# Patient Record
Sex: Female | Born: 1955
Health system: Southern US, Community
[De-identification: ages and names within clinical notes are randomized; demographics above are authoritative.]

## PROBLEM LIST (undated history)

## (undated) DIAGNOSIS — K219 Gastro-esophageal reflux disease without esophagitis: Secondary | ICD-10-CM

## (undated) DIAGNOSIS — E559 Vitamin D deficiency, unspecified: Secondary | ICD-10-CM

## (undated) DIAGNOSIS — I10 Essential (primary) hypertension: Secondary | ICD-10-CM

## (undated) DIAGNOSIS — I639 Cerebral infarction, unspecified: Secondary | ICD-10-CM

## (undated) DIAGNOSIS — E785 Hyperlipidemia, unspecified: Secondary | ICD-10-CM

## (undated) HISTORY — DX: Essential (primary) hypertension: I10

## (undated) HISTORY — DX: Gastro-esophageal reflux disease without esophagitis: K21.9

## (undated) HISTORY — DX: Vitamin D deficiency, unspecified: E55.9

## (undated) HISTORY — DX: Hyperlipidemia, unspecified: E78.5

---

## 1992-11-12 HISTORY — PX: CHOLECYSTECTOMY: SHX55

## 1993-11-12 HISTORY — PX: TUBAL LIGATION: SHX77

## 2000-11-12 HISTORY — PX: OTHER SURGICAL HISTORY: SHX169

## 2005-02-08 ENCOUNTER — Ambulatory Visit: Payer: Self-pay | Admitting: Internal Medicine

## 2005-02-13 ENCOUNTER — Ambulatory Visit: Payer: Self-pay | Admitting: Internal Medicine

## 2005-08-17 ENCOUNTER — Ambulatory Visit: Payer: Self-pay | Admitting: Internal Medicine

## 2006-02-01 ENCOUNTER — Ambulatory Visit: Payer: Self-pay | Admitting: Internal Medicine

## 2006-04-05 ENCOUNTER — Ambulatory Visit: Payer: Self-pay | Admitting: Internal Medicine

## 2007-07-03 ENCOUNTER — Ambulatory Visit: Payer: Self-pay | Admitting: Internal Medicine

## 2008-07-13 ENCOUNTER — Ambulatory Visit: Payer: Self-pay | Admitting: Internal Medicine

## 2009-08-01 ENCOUNTER — Ambulatory Visit: Payer: Self-pay | Admitting: Internal Medicine

## 2010-04-05 ENCOUNTER — Ambulatory Visit: Payer: Self-pay | Admitting: Unknown Physician Specialty

## 2010-09-09 ENCOUNTER — Ambulatory Visit: Payer: Self-pay | Admitting: Internal Medicine

## 2010-12-06 ENCOUNTER — Ambulatory Visit: Payer: Self-pay | Admitting: Internal Medicine

## 2010-12-06 LAB — HM DEXA SCAN

## 2010-12-19 ENCOUNTER — Ambulatory Visit: Payer: Self-pay | Admitting: Internal Medicine

## 2011-11-26 LAB — HEPATIC FUNCTION PANEL
ALT: 13 U/L (ref 7–35)
AST: 13 U/L (ref 13–35)

## 2011-11-26 LAB — HM PAP SMEAR: HM Pap smear: NEGATIVE

## 2012-01-02 ENCOUNTER — Ambulatory Visit: Payer: Self-pay | Admitting: Unknown Physician Specialty

## 2012-01-05 LAB — HM MAMMOGRAPHY

## 2012-06-02 LAB — BASIC METABOLIC PANEL
BUN: 8 mg/dL (ref 4–21)
Creatinine: 1 mg/dL (ref <=1.1)
Potassium: 4.3 mmol/L (ref 3.4–5.3)
Sodium: 140 mmol/L (ref 137–147)

## 2012-08-14 ENCOUNTER — Ambulatory Visit: Payer: Self-pay | Admitting: Family Medicine

## 2012-09-01 ENCOUNTER — Ambulatory Visit: Payer: Self-pay

## 2012-09-17 ENCOUNTER — Ambulatory Visit: Payer: Self-pay | Admitting: Family Medicine

## 2012-12-05 ENCOUNTER — Encounter: Payer: Self-pay | Admitting: *Deleted

## 2012-12-08 ENCOUNTER — Other Ambulatory Visit: Payer: Self-pay | Admitting: *Deleted

## 2012-12-08 ENCOUNTER — Encounter: Payer: Self-pay | Admitting: Internal Medicine

## 2012-12-08 ENCOUNTER — Ambulatory Visit (INDEPENDENT_AMBULATORY_CARE_PROVIDER_SITE_OTHER): Payer: 59 | Admitting: Internal Medicine

## 2012-12-08 ENCOUNTER — Other Ambulatory Visit (HOSPITAL_COMMUNITY)
Admission: RE | Admit: 2012-12-08 | Discharge: 2012-12-08 | Disposition: A | Discharge status: Home / Self Care | Payer: 59 | Source: Ambulatory Visit | Attending: Internal Medicine | Admitting: Internal Medicine

## 2012-12-08 VITALS — BP 118/78 | HR 94 | Temp 97.5°F | Ht 67.5 in | Wt 185.0 lb

## 2012-12-08 DIAGNOSIS — K219 Gastro-esophageal reflux disease without esophagitis: Secondary | ICD-10-CM

## 2012-12-08 DIAGNOSIS — Z01419 Encounter for gynecological examination (general) (routine) without abnormal findings: Secondary | ICD-10-CM | POA: Insufficient documentation

## 2012-12-08 DIAGNOSIS — E559 Vitamin D deficiency, unspecified: Secondary | ICD-10-CM

## 2012-12-08 DIAGNOSIS — E7800 Pure hypercholesterolemia, unspecified: Secondary | ICD-10-CM

## 2012-12-08 DIAGNOSIS — R5381 Other malaise: Secondary | ICD-10-CM

## 2012-12-08 DIAGNOSIS — R5383 Other fatigue: Secondary | ICD-10-CM

## 2012-12-08 DIAGNOSIS — Z139 Encounter for screening, unspecified: Secondary | ICD-10-CM

## 2012-12-08 DIAGNOSIS — Z1151 Encounter for screening for human papillomavirus (HPV): Secondary | ICD-10-CM | POA: Insufficient documentation

## 2012-12-08 DIAGNOSIS — I10 Essential (primary) hypertension: Secondary | ICD-10-CM

## 2012-12-08 DIAGNOSIS — E1169 Type 2 diabetes mellitus with other specified complication: Secondary | ICD-10-CM | POA: Insufficient documentation

## 2012-12-08 LAB — CBC WITH DIFFERENTIAL/PLATELET
Eosinophils Absolute: 0.1 10*3/uL (ref 0.0–0.7)
HCT: 44.2 % (ref 36.0–46.0)
Lymphs Abs: 1.9 10*3/uL (ref 0.7–4.0)
MCHC: 33.5 g/dL (ref 30.0–36.0)
MCV: 92 fl (ref 78.0–100.0)
Monocytes Absolute: 0.5 10*3/uL (ref 0.1–1.0)
Neutrophils Relative %: 68.5 % (ref 43.0–77.0)
Platelets: 212 10*3/uL (ref 150.0–400.0)

## 2012-12-08 LAB — TSH: TSH: 1.81 u[IU]/mL (ref 0.35–5.50)

## 2012-12-08 LAB — COMPREHENSIVE METABOLIC PANEL
AST: 16 U/L (ref 0–37)
Albumin: 4.1 g/dL (ref 3.5–5.2)
Alkaline Phosphatase: 78 U/L (ref 39–117)
BUN: 9 mg/dL (ref 6–23)
Potassium: 4.5 mEq/L (ref 3.5–5.1)
Sodium: 141 mEq/L (ref 135–145)
Total Bilirubin: 0.5 mg/dL (ref 0.3–1.2)

## 2012-12-10 ENCOUNTER — Encounter: Payer: Self-pay | Admitting: Internal Medicine

## 2012-12-10 DIAGNOSIS — I10 Essential (primary) hypertension: Secondary | ICD-10-CM | POA: Insufficient documentation

## 2012-12-10 DIAGNOSIS — E559 Vitamin D deficiency, unspecified: Secondary | ICD-10-CM | POA: Insufficient documentation

## 2012-12-10 DIAGNOSIS — K219 Gastro-esophageal reflux disease without esophagitis: Secondary | ICD-10-CM | POA: Insufficient documentation

## 2012-12-10 NOTE — Assessment & Plan Note (Signed)
Blood pressure doing well on current medication regimen.  Check metabolic panel.  Follow.

## 2012-12-10 NOTE — Assessment & Plan Note (Signed)
Continue on vitamin d supplements.  Follow.    

## 2012-12-10 NOTE — Progress Notes (Signed)
Subjective:    Patient ID: Carrie Wilson, female    DOB: [birth year], 57 y.o.   MRN: [id number]  HPI 57 year old female with past history of hypertension and hypercholesterolemia who comes in today to follow up on these issues as well as for a complete physical exam.  She states she is doing well.  Previously had increased congestion.  Has resolved now - after abx.  Still smoking.  Discussed with her regarding stopping smoking.  Not ready to quit.  Down to 1/2 ppd.  Feels her breathing is stable.  No chest pain or tightness.  No acid reflux.  Has not needed nexium.  No nausea or vomiting.  Bowels stable.   Past Medical History  Diagnosis Date  . Hyperlipidemia   . Hypertension   . GERD (gastroesophageal reflux disease)     neg H. pylori  . Vitamin D deficiency     Current Outpatient Prescriptions on File Prior to Visit  Medication Sig Dispense Refill  . Cholecalciferol (VITAMIN D-3) 1000 UNITS CAPS Take by mouth daily. 1 tab daily      . lisinopril (PRINIVIL,ZESTRIL) 10 MG tablet Take 10 mg by mouth daily.        Review of Systems Patient denies any headache, lightheadedness or dizziness. No sinus or allergy symptoms currently.   No chest pain, tightness or palpitations.  No increased shortness of breath, cough or congestion.  No nausea or vomiting.  No acid reflux.  No abdominal pain or cramping.  No bowel change, such as diarrhea, constipation, BRBPR or melana.  No urine change.   Some fatigue, but overall she feels she is doing well.  Increased stress at work, but she feels she is handling well.      Objective:   Physical Exam Filed Vitals:   12/08/12 0811  BP: 118/78  Pulse: 94  Temp: 97.5 F (32.80 C)   57 year old female in no acute distress.   HEENT:  Nares- clear.  Oropharynx - without lesions. NECK:  Supple.  Nontender.  No audible bruit.  HEART:  Appears to be regular. LUNGS:  No crackles or wheezing audible.  Respirations even and unlabored.  RADIAL PULSE:  Equal  bilaterally.    BREASTS:  No nipple discharge or nipple retraction present.  Could not appreciate any distinct nodules or axillary adenopathy.  ABDOMEN:  Soft, nontender.  Bowel sounds present and normal.  No audible abdominal bruit.  GU:  Normal external genitalia.  Vaginal vault without lesions.  Cervix identified.  Pap performed. Could not appreciate any adnexal masses or tenderness.   RECTAL:  Heme negative.   EXTREMITIES:  No increased edema present.  DP pulses palpable and equal bilaterally.           Assessment & Plan:  SMOKING CESSATION.  Discussed with her today regarding the need to quit.  See above.    PULMONARY.  Breathing stable.  CXR 11/26/11 - no acute cardiopulmonary disease.    HISTORY OF VERTIGO.  Saw ENT.  Had epley maneuvers.  Doing well now.  Follow.   HEALTH MAINTENANCE.  Physical today.  Last mammogram 01/02/12 - BiRads ii.  Schedule follow up mammogram.  colonoscopoy 04/05/10 with one 3mm polyp removed and internal hemorrhoids.  Per record due follow up 03/2015.

## 2012-12-10 NOTE — Assessment & Plan Note (Signed)
Currently asymptomatic on no medications.  Follow.   

## 2012-12-10 NOTE — Assessment & Plan Note (Signed)
Low cholesterol diet and exercise.  Check lipid panel.   

## 2012-12-11 ENCOUNTER — Encounter: Payer: Self-pay | Admitting: *Deleted

## 2012-12-15 ENCOUNTER — Encounter: Payer: Self-pay | Admitting: *Deleted

## 2012-12-16 ENCOUNTER — Encounter: Payer: Self-pay | Admitting: *Deleted

## 2013-01-20 ENCOUNTER — Ambulatory Visit: Payer: Self-pay | Admitting: Internal Medicine

## 2013-02-19 ENCOUNTER — Encounter: Payer: Self-pay | Admitting: Internal Medicine

## 2013-06-09 ENCOUNTER — Encounter: Payer: Self-pay | Admitting: Internal Medicine

## 2013-06-09 ENCOUNTER — Ambulatory Visit (INDEPENDENT_AMBULATORY_CARE_PROVIDER_SITE_OTHER): Payer: 59 | Admitting: Internal Medicine

## 2013-06-09 VITALS — BP 110/70 | HR 90 | Temp 98.1°F | Ht 67.5 in | Wt 186.5 lb

## 2013-06-09 DIAGNOSIS — I10 Essential (primary) hypertension: Secondary | ICD-10-CM

## 2013-06-09 DIAGNOSIS — E559 Vitamin D deficiency, unspecified: Secondary | ICD-10-CM

## 2013-06-09 DIAGNOSIS — K219 Gastro-esophageal reflux disease without esophagitis: Secondary | ICD-10-CM

## 2013-06-09 DIAGNOSIS — E7800 Pure hypercholesterolemia, unspecified: Secondary | ICD-10-CM

## 2013-06-09 LAB — BASIC METABOLIC PANEL
CO2: 30 mEq/L (ref 19–32)
Chloride: 104 mEq/L (ref 96–112)
Glucose, Bld: 102 mg/dL — ABNORMAL HIGH (ref 70–99)
Potassium: 4.4 mEq/L (ref 3.5–5.1)
Sodium: 141 mEq/L (ref 135–145)

## 2013-06-09 LAB — LIPID PANEL
HDL: 33.9 mg/dL — ABNORMAL LOW (ref >39.00)
Total CHOL/HDL Ratio: 6
VLDL: 29 mg/dL (ref 0.0–40.0)

## 2013-06-09 NOTE — Progress Notes (Signed)
Subjective:    Patient ID: Carrie Wilson, female    DOB: Jan 02, 1956, 57 y.o.   MRN: [id number]  HPI 57 year old female with past history of hypertension and hypercholesterolemia who comes in today for a scheduled follow up.   She states she is doing well. Still smoking.  Discussed with her regarding stopping smoking.  Not ready to quit.  Feels her breathing is stable.  No chest pain or tightness.  No acid reflux.  Has not needed nexium.  No nausea or vomiting.  Bowels stable.  Blood pressure lower then normal.  No significant dizziness.  Had some light headedness when she went to the dentist.  This occurred when lying back in the chair.   Not a persistent problem.     Past Medical History  Diagnosis Date  . Hyperlipidemia   . Hypertension   . GERD (gastroesophageal reflux disease)     neg H. pylori  . Vitamin D deficiency     Current Outpatient Prescriptions on File Prior to Visit  Medication Sig Dispense Refill  . Cholecalciferol (VITAMIN D-3) 1000 UNITS CAPS Take by mouth daily. 1 tab daily      . lisinopril (PRINIVIL,ZESTRIL) 10 MG tablet Take 10 mg by mouth daily.       No current facility-administered medications on file prior to visit.    Review of Systems Patient denies any headache.  No significant lightheadedness or dizziness. No sinus or allergy symptoms currently.   No chest pain, tightness or palpitations.  No increased shortness of breath, cough or congestion.  No nausea or vomiting.  No acid reflux.  No abdominal pain or cramping.  No bowel change, such as diarrhea, constipation, BRBPR or melana.  No urine change.   No significant stress.       Objective:   Physical Exam  Filed Vitals:   06/09/13 0800  BP: 110/70  Pulse: 90  Temp: 98.1 F (36.7 C)   Blood pressure recheck:  110/78, pulse 54  57 year old female in no acute distress.   HEENT:  Nares- clear.  Oropharynx - without lesions. NECK:  Supple.  Nontender.  No audible bruit.  HEART:  Appears to be  regular. LUNGS:  No crackles or wheezing audible.  Respirations even and unlabored.  RADIAL PULSE:  Equal bilaterally.  ABDOMEN:  Soft, nontender.  Bowel sounds present and normal.  No audible abdominal bruit.   EXTREMITIES:  No increased edema present.  DP pulses palpable and equal bilaterally.           Assessment & Plan:  SMOKING CESSATION.  Discussed with her today regarding the need to quit.  See above.  Desires not to quit at this time.    PULMONARY.  Breathing stable.  CXR 11/26/11 - no acute cardiopulmonary disease.    HISTORY OF VERTIGO.  Saw ENT.  Had epley maneuvers.  Doing well now.  Follow.   HEALTH MAINTENANCE.  Physical 12/08/12.  Last mammogram 01/20/13 - BiRads I.  Colonoscopy 04/05/10 with one 3mm polyp removed and internal hemorrhoids.  Per record due follow up 03/2015.

## 2013-06-10 ENCOUNTER — Encounter: Payer: Self-pay | Admitting: *Deleted

## 2013-06-11 ENCOUNTER — Encounter: Payer: Self-pay | Admitting: Internal Medicine

## 2013-06-11 NOTE — Assessment & Plan Note (Signed)
Low cholesterol diet and exercise.  Check lipid panel.   

## 2013-06-11 NOTE — Assessment & Plan Note (Signed)
Blood pressure as outlined.  Stress is much better.  Will stop the lisinopril and have her spot check her pressures.  She will send in readings over the next few weeks.  If elevates, consider restarting lisinopril 5mg  q day.  Follow.  Check metabolic panel.

## 2013-06-11 NOTE — Assessment & Plan Note (Signed)
Continue on vitamin d supplements.  Follow.    

## 2013-06-11 NOTE — Assessment & Plan Note (Signed)
Currently asymptomatic on no medications.  Follow.   

## 2013-07-28 ENCOUNTER — Encounter: Payer: Self-pay | Admitting: *Deleted

## 2013-12-15 ENCOUNTER — Other Ambulatory Visit (HOSPITAL_COMMUNITY)
Admission: RE | Admit: 2013-12-15 | Discharge: 2013-12-15 | Disposition: A | Discharge status: Home / Self Care | Payer: 59 | Source: Ambulatory Visit | Attending: Internal Medicine | Admitting: Internal Medicine

## 2013-12-15 ENCOUNTER — Other Ambulatory Visit: Payer: Self-pay | Admitting: Internal Medicine

## 2013-12-15 ENCOUNTER — Ambulatory Visit (INDEPENDENT_AMBULATORY_CARE_PROVIDER_SITE_OTHER): Payer: 59 | Admitting: Internal Medicine

## 2013-12-15 ENCOUNTER — Encounter: Payer: Self-pay | Admitting: Internal Medicine

## 2013-12-15 ENCOUNTER — Encounter (INDEPENDENT_AMBULATORY_CARE_PROVIDER_SITE_OTHER): Payer: Self-pay

## 2013-12-15 VITALS — BP 120/80 | HR 81 | Temp 98.1°F | Ht 67.75 in | Wt 184.5 lb

## 2013-12-15 DIAGNOSIS — Z01419 Encounter for gynecological examination (general) (routine) without abnormal findings: Secondary | ICD-10-CM | POA: Insufficient documentation

## 2013-12-15 DIAGNOSIS — I10 Essential (primary) hypertension: Secondary | ICD-10-CM

## 2013-12-15 DIAGNOSIS — E559 Vitamin D deficiency, unspecified: Secondary | ICD-10-CM

## 2013-12-15 DIAGNOSIS — E7800 Pure hypercholesterolemia, unspecified: Secondary | ICD-10-CM

## 2013-12-15 DIAGNOSIS — K219 Gastro-esophageal reflux disease without esophagitis: Secondary | ICD-10-CM

## 2013-12-15 DIAGNOSIS — Z1151 Encounter for screening for human papillomavirus (HPV): Secondary | ICD-10-CM | POA: Insufficient documentation

## 2013-12-15 DIAGNOSIS — Z1211 Encounter for screening for malignant neoplasm of colon: Secondary | ICD-10-CM

## 2013-12-15 DIAGNOSIS — Z1239 Encounter for other screening for malignant neoplasm of breast: Secondary | ICD-10-CM

## 2013-12-15 DIAGNOSIS — Z124 Encounter for screening for malignant neoplasm of cervix: Secondary | ICD-10-CM

## 2013-12-15 LAB — CBC WITH DIFFERENTIAL/PLATELET
Basophils Absolute: 0 10*3/uL (ref 0.0–0.1)
Basophils Relative: 0.6 % (ref 0.0–3.0)
EOS ABS: 0.1 10*3/uL (ref 0.0–0.7)
Eosinophils Relative: 1 % (ref 0.0–5.0)
HCT: 46.8 % — ABNORMAL HIGH (ref 36.0–46.0)
Hemoglobin: 15.4 g/dL — ABNORMAL HIGH (ref 12.0–15.0)
Lymphocytes Relative: 24 % (ref 12.0–46.0)
Lymphs Abs: 1.8 10*3/uL (ref 0.7–4.0)
MCHC: 32.8 g/dL (ref 30.0–36.0)
MCV: 94.1 fl (ref 78.0–100.0)
MONO ABS: 0.5 10*3/uL (ref 0.1–1.0)
Monocytes Relative: 6.5 % (ref 3.0–12.0)
NEUTROS PCT: 67.9 % (ref 43.0–77.0)
Neutro Abs: 5.1 10*3/uL (ref 1.4–7.7)
PLATELETS: 193 10*3/uL (ref 150.0–400.0)
RBC: 4.98 Mil/uL (ref 3.87–5.11)
RDW: 12.9 % (ref 11.5–14.6)
WBC: 7.6 10*3/uL (ref 4.5–10.5)

## 2013-12-15 LAB — COMPREHENSIVE METABOLIC PANEL
ALBUMIN: 4.1 g/dL (ref 3.5–5.2)
ALT: 15 U/L (ref 0–35)
AST: 16 U/L (ref 0–37)
Alkaline Phosphatase: 84 U/L (ref 39–117)
BUN: 10 mg/dL (ref 6–23)
CALCIUM: 9.4 mg/dL (ref 8.4–10.5)
CO2: 28 mEq/L (ref 19–32)
Chloride: 106 mEq/L (ref 96–112)
Creatinine, Ser: 0.8 mg/dL (ref 0.4–1.2)
GFR: 73.73 mL/min (ref >60.00)
Glucose, Bld: 95 mg/dL (ref 70–99)
POTASSIUM: 4.6 meq/L (ref 3.5–5.1)
Sodium: 142 mEq/L (ref 135–145)
Total Bilirubin: 0.8 mg/dL (ref 0.3–1.2)
Total Protein: 6.7 g/dL (ref 6.0–8.3)

## 2013-12-15 LAB — LIPID PANEL
CHOL/HDL RATIO: 5
Cholesterol: 203 mg/dL — ABNORMAL HIGH (ref 0–200)
HDL: 37.3 mg/dL — ABNORMAL LOW (ref >39.00)
Triglycerides: 207 mg/dL — ABNORMAL HIGH (ref 0.0–149.0)
VLDL: 41.4 mg/dL — AB (ref 0.0–40.0)

## 2013-12-15 LAB — LDL CHOLESTEROL, DIRECT: Direct LDL: 139.4 mg/dL

## 2013-12-15 LAB — TSH: TSH: 1.62 u[IU]/mL (ref 0.35–5.50)

## 2013-12-15 LAB — HM COLONOSCOPY

## 2013-12-15 NOTE — Progress Notes (Signed)
Orders placed for f/u labs.  

## 2013-12-16 ENCOUNTER — Encounter: Payer: Self-pay | Admitting: *Deleted

## 2013-12-16 LAB — VITAMIN D 25 HYDROXY (VIT D DEFICIENCY, FRACTURES): Vit D, 25-Hydroxy: 48 ng/mL (ref 30–89)

## 2013-12-20 ENCOUNTER — Encounter: Payer: Self-pay | Admitting: Internal Medicine

## 2013-12-20 NOTE — Assessment & Plan Note (Signed)
Continue on vitamin d supplements.  Follow.    

## 2013-12-20 NOTE — Assessment & Plan Note (Signed)
Low cholesterol diet and exercise.  Check lipid panel.   

## 2013-12-20 NOTE — Assessment & Plan Note (Signed)
Currently asymptomatic on no medications.  Follow.   

## 2013-12-20 NOTE — Progress Notes (Signed)
Subjective:    Patient ID: Carrie Wilson, female    DOB: 24-Jul-1956, 58 y.o.   MRN: [id number]  HPI 58 year old female with past history of hypertension and hypercholesterolemia who comes in today for a complete physical exam.  She states she is doing well. Still smoking.  Discussed with her regarding stopping smoking.  Not ready to quit.  Feels her breathing is stable.  No chest pain or tightness.  No acid reflux.  Has not needed nexium.  No nausea or vomiting.  Bowels stable.  Blood pressure - doing well on no medication.  No dizziness or light headedness.  Some minimal sinus pressure.  Using nasonex.     Past Medical History  Diagnosis Date  . Hyperlipidemia   . Hypertension   . GERD (gastroesophageal reflux disease)     neg H. pylori  . Vitamin D deficiency     Current Outpatient Prescriptions on File Prior to Visit  Medication Sig Dispense Refill  . Cholecalciferol (VITAMIN D-3) 1000 UNITS CAPS Take by mouth daily. 1 tab daily       No current facility-administered medications on file prior to visit.    Review of Systems Patient denies any headache.  No significant lightheadedness or dizziness.  No significant sinus or allergy symptoms currently.   Minimal sinus pressure.  Using nasonex.  No chest pain, tightness or palpitations.  No increased shortness of breath, cough or congestion.  No nausea or vomiting.  No acid reflux.  No abdominal pain or cramping.  No bowel change, such as diarrhea, constipation, BRBPR or melana.  No urine change.   No significant stress.  Still smoking.  Desires not to quit.      Objective:   Physical Exam  Filed Vitals:   12/15/13 0839  BP: 120/80  Pulse: 81  Temp: 98.1 F (58.74 C)   58 year old female in no acute distress.   HEENT:  Nares- clear.  Oropharynx - without lesions. NECK:  Supple.  Nontender.  No audible bruit.  HEART:  Appears to be regular. LUNGS:  No crackles or wheezing audible.  Respirations even and unlabored.  RADIAL  PULSE:  Equal bilaterally.    BREASTS:  No nipple discharge or nipple retraction present.  Could not appreciate any distinct nodules or axillary adenopathy.  ABDOMEN:  Soft, nontender.  Bowel sounds present and normal.  No audible abdominal bruit.  GU:  Normal external genitalia.  Vaginal vault without lesions.  Cervix identified.  Pap performed. Could not appreciate any adnexal masses or tenderness.   RECTAL:  Heme negative.   EXTREMITIES:  No increased edema present.  DP pulses palpable and equal bilaterally.          Assessment & Plan:  SMOKING CESSATION.  Discussed with her today regarding the need to quit.  See above.  Desires not to quit at this time.    PULMONARY.  Breathing stable.  CXR 11/26/11 - no acute cardiopulmonary disease.   ALLERGIES.  Some minimal sinus pressure.  Saline nasal spray and nasonex.  Follow.     HISTORY OF VERTIGO.  Saw ENT.  Had epley maneuvers.  Doing well now.  Follow.   HEALTH MAINTENANCE.  Physical today.  Last mammogram 01/20/13 - BiRads I.   Schedule a f/u mammogram when due.  Colonoscopy 04/05/10 with one 3mm polyp removed and internal hemorrhoids.  Per record due follow up 03/2015.

## 2013-12-20 NOTE — Assessment & Plan Note (Signed)
Blood pressure as outlined.  Doing well on no medication.  Follow.  Check metabolic panel.   

## 2013-12-23 ENCOUNTER — Other Ambulatory Visit (HOSPITAL_COMMUNITY)
Admission: RE | Admit: 2013-12-23 | Discharge: 2013-12-23 | Disposition: A | Discharge status: Home / Self Care | Payer: 59 | Source: Ambulatory Visit | Attending: Internal Medicine | Admitting: Internal Medicine

## 2013-12-25 ENCOUNTER — Encounter: Payer: Self-pay | Admitting: *Deleted

## 2014-01-21 ENCOUNTER — Ambulatory Visit: Payer: Self-pay | Admitting: Internal Medicine

## 2014-01-21 LAB — HM MAMMOGRAPHY: HM Mammogram: NEGATIVE

## 2014-01-25 ENCOUNTER — Encounter: Payer: Self-pay | Admitting: Internal Medicine

## 2014-02-04 ENCOUNTER — Other Ambulatory Visit (INDEPENDENT_AMBULATORY_CARE_PROVIDER_SITE_OTHER): Payer: 59

## 2014-02-04 DIAGNOSIS — Z1211 Encounter for screening for malignant neoplasm of colon: Secondary | ICD-10-CM

## 2014-02-04 LAB — FECAL OCCULT BLOOD, IMMUNOCHEMICAL: Fecal Occult Bld: NEGATIVE

## 2014-02-05 ENCOUNTER — Encounter: Payer: Self-pay | Admitting: *Deleted

## 2014-06-14 ENCOUNTER — Ambulatory Visit (INDEPENDENT_AMBULATORY_CARE_PROVIDER_SITE_OTHER): Payer: 59 | Admitting: Internal Medicine

## 2014-06-14 ENCOUNTER — Encounter: Payer: Self-pay | Admitting: Internal Medicine

## 2014-06-14 ENCOUNTER — Encounter (INDEPENDENT_AMBULATORY_CARE_PROVIDER_SITE_OTHER): Payer: Self-pay

## 2014-06-14 VITALS — BP 130/80 | HR 80 | Temp 98.2°F | Ht 67.75 in | Wt 193.0 lb

## 2014-06-14 DIAGNOSIS — K219 Gastro-esophageal reflux disease without esophagitis: Secondary | ICD-10-CM

## 2014-06-14 DIAGNOSIS — I10 Essential (primary) hypertension: Secondary | ICD-10-CM

## 2014-06-14 DIAGNOSIS — Z720 Tobacco use: Secondary | ICD-10-CM

## 2014-06-14 DIAGNOSIS — E7800 Pure hypercholesterolemia, unspecified: Secondary | ICD-10-CM

## 2014-06-14 DIAGNOSIS — F17200 Nicotine dependence, unspecified, uncomplicated: Secondary | ICD-10-CM

## 2014-06-14 DIAGNOSIS — D582 Other hemoglobinopathies: Secondary | ICD-10-CM

## 2014-06-14 DIAGNOSIS — E559 Vitamin D deficiency, unspecified: Secondary | ICD-10-CM

## 2014-06-14 LAB — CBC WITH DIFFERENTIAL/PLATELET
BASOS ABS: 0 10*3/uL (ref 0.0–0.1)
Basophils Relative: 0.6 % (ref 0.0–3.0)
EOS ABS: 0.1 10*3/uL (ref 0.0–0.7)
Eosinophils Relative: 1.2 % (ref 0.0–5.0)
HCT: 44 % (ref 36.0–46.0)
Hemoglobin: 14.6 g/dL (ref 12.0–15.0)
LYMPHS PCT: 28.5 % (ref 12.0–46.0)
Lymphs Abs: 2.1 10*3/uL (ref 0.7–4.0)
MCHC: 33.1 g/dL (ref 30.0–36.0)
MCV: 92.3 fl (ref 78.0–100.0)
MONO ABS: 0.5 10*3/uL (ref 0.1–1.0)
Monocytes Relative: 7.3 % (ref 3.0–12.0)
NEUTROS PCT: 62.4 % (ref 43.0–77.0)
Neutro Abs: 4.7 10*3/uL (ref 1.4–7.7)
PLATELETS: 199 10*3/uL (ref 150.0–400.0)
RBC: 4.77 Mil/uL (ref 3.87–5.11)
RDW: 13.4 % (ref 11.5–15.5)
WBC: 7.5 10*3/uL (ref 4.0–10.5)

## 2014-06-14 LAB — COMPREHENSIVE METABOLIC PANEL
ALBUMIN: 3.8 g/dL (ref 3.5–5.2)
ALT: 12 U/L (ref 0–35)
AST: 18 U/L (ref 0–37)
Alkaline Phosphatase: 67 U/L (ref 39–117)
BUN: 9 mg/dL (ref 6–23)
CALCIUM: 8.5 mg/dL (ref 8.4–10.5)
CHLORIDE: 103 meq/L (ref 96–112)
CO2: 28 mEq/L (ref 19–32)
Creatinine, Ser: 0.9 mg/dL (ref 0.4–1.2)
GFR: 67.11 mL/min (ref >60.00)
Glucose, Bld: 93 mg/dL (ref 70–99)
POTASSIUM: 4 meq/L (ref 3.5–5.1)
SODIUM: 136 meq/L (ref 135–145)
Total Bilirubin: 0.5 mg/dL (ref 0.2–1.2)
Total Protein: 6.7 g/dL (ref 6.0–8.3)

## 2014-06-14 LAB — LIPID PANEL
CHOL/HDL RATIO: 5
CHOLESTEROL: 189 mg/dL (ref 0–200)
HDL: 38.1 mg/dL — ABNORMAL LOW (ref >39.00)
LDL CALC: 129 mg/dL — AB (ref 0–99)
NonHDL: 150.9
Triglycerides: 110 mg/dL (ref 0.0–149.0)
VLDL: 22 mg/dL (ref 0.0–40.0)

## 2014-06-14 NOTE — Progress Notes (Signed)
Pre visit review using our clinic review tool, if applicable. No additional management support is needed unless otherwise documented below in the visit note. 

## 2014-06-15 ENCOUNTER — Encounter: Payer: Self-pay | Admitting: *Deleted

## 2014-06-20 ENCOUNTER — Encounter: Payer: Self-pay | Admitting: Internal Medicine

## 2014-06-20 DIAGNOSIS — Z720 Tobacco use: Secondary | ICD-10-CM | POA: Insufficient documentation

## 2014-06-20 NOTE — Assessment & Plan Note (Signed)
Discussed the need for smoking cessation.  She is not ready to quit.  Follow.

## 2014-06-20 NOTE — Assessment & Plan Note (Signed)
Continue on vitamin d supplements.  Follow.

## 2014-06-20 NOTE — Assessment & Plan Note (Signed)
Currently asymptomatic on no medications.  Follow.

## 2014-06-20 NOTE — Assessment & Plan Note (Signed)
Blood pressure as outlined.  Doing well on no medication.  Follow.  Check metabolic panel.

## 2014-06-20 NOTE — Progress Notes (Signed)
Subjective:    Patient ID: Carrie Wilson, female    DOB: Sep 22, 1956, 58 y.o.   MRN: [id number]  HPI 58 year old female with past history of hypertension and hypercholesterolemia who comes in today for a scheduled follow up.  She states she is doing well. Still smoking.  Discussed with her again today regarding stopping smoking.  Not ready to quit.  Smoking 1/2 ppd.  Feels her breathing is stable.  No chest pain or tightness.  No acid reflux.  Has not needed nexium.  No nausea or vomiting.  Bowels stable.  Blood pressure - doing well on no medication.  No dizziness or light headedness.     Past Medical History  Diagnosis Date  . Hyperlipidemia   . Hypertension   . GERD (gastroesophageal reflux disease)     neg H. pylori  . Vitamin D deficiency     Current Outpatient Prescriptions on File Prior to Visit  Medication Sig Dispense Refill  . Cholecalciferol (VITAMIN D-3) 1000 UNITS CAPS Take by mouth daily. 1 tab daily       No current facility-administered medications on file prior to visit.    Review of Systems Patient denies any headache.  No lightheadedness or dizziness reported.   No significant sinus or allergy symptoms currently.   No chest pain, tightness or palpitations.  No increased shortness of breath, cough or congestion.  No nausea or vomiting.  No acid reflux.  No abdominal pain or cramping.  No bowel change, such as diarrhea, constipation, BRBPR or melana.  No urine change.   No significant stress.  Still smoking.  Desires not to quit.      Objective:   Physical Exam  Filed Vitals:   06/14/14 0855  BP: 130/80  Pulse: 80  Temp: 98.2 F (36.8 C)   Blood pressure recheck:  25/37  58 year old female in no acute distress.   HEENT:  Nares- clear.  Oropharynx - without lesions. NECK:  Supple.  Nontender.  No audible bruit.  HEART:  Appears to be regular. LUNGS:  No crackles or wheezing audible.  Respirations even and unlabored.  RADIAL PULSE:  Equal bilaterally.    ABDOMEN:  Soft, nontender.  Bowel sounds present and normal.  No audible abdominal bruit.  EXTREMITIES:  No increased edema present.  DP pulses palpable and equal bilaterally.          Assessment & Plan:  SMOKING CESSATION.  Discussed with her today regarding the need to quit.  See above.  Desires not to quit at this time.    PULMONARY.  Breathing stable.  CXR 11/26/11 - no acute cardiopulmonary disease.   ALLERGIES.  No problems reported today.     HISTORY OF VERTIGO.  Saw ENT.  Had epley maneuvers.  Doing well now.  Follow.   HEALTH MAINTENANCE.  Physical 12/15/13.  Last mammogram 01/21/14 - BiRads I.  Colonoscopy 04/05/10 with one 3mm polyp removed and internal hemorrhoids.  Per record due follow up 03/2015.

## 2014-06-20 NOTE — Assessment & Plan Note (Signed)
Low cholesterol diet and exercise.  Check lipid panel.   

## 2014-12-16 ENCOUNTER — Encounter: Payer: Self-pay | Admitting: Internal Medicine

## 2014-12-16 ENCOUNTER — Ambulatory Visit (INDEPENDENT_AMBULATORY_CARE_PROVIDER_SITE_OTHER): Payer: 59 | Admitting: Internal Medicine

## 2014-12-16 VITALS — BP 110/80 | HR 81 | Temp 98.3°F | Ht 67.9 in | Wt 190.2 lb

## 2014-12-16 DIAGNOSIS — E7800 Pure hypercholesterolemia, unspecified: Secondary | ICD-10-CM

## 2014-12-16 DIAGNOSIS — Z0000 Encounter for general adult medical examination without abnormal findings: Secondary | ICD-10-CM

## 2014-12-16 DIAGNOSIS — Z720 Tobacco use: Secondary | ICD-10-CM

## 2014-12-16 DIAGNOSIS — I10 Essential (primary) hypertension: Secondary | ICD-10-CM

## 2014-12-16 DIAGNOSIS — E559 Vitamin D deficiency, unspecified: Secondary | ICD-10-CM

## 2014-12-16 DIAGNOSIS — K219 Gastro-esophageal reflux disease without esophagitis: Secondary | ICD-10-CM

## 2014-12-16 DIAGNOSIS — M25562 Pain in left knee: Secondary | ICD-10-CM

## 2014-12-16 LAB — COMPREHENSIVE METABOLIC PANEL
ALT: 11 U/L (ref 0–35)
AST: 12 U/L (ref 0–37)
Albumin: 4 g/dL (ref 3.5–5.2)
Alkaline Phosphatase: 91 U/L (ref 39–117)
BUN: 9 mg/dL (ref 6–23)
CO2: 29 mEq/L (ref 19–32)
CREATININE: 0.89 mg/dL (ref 0.40–1.20)
Calcium: 9.4 mg/dL (ref 8.4–10.5)
Chloride: 106 mEq/L (ref 96–112)
GFR: 68.73 mL/min (ref >60.00)
Glucose, Bld: 94 mg/dL (ref 70–99)
Potassium: 4.2 mEq/L (ref 3.5–5.1)
Sodium: 143 mEq/L (ref 135–145)
TOTAL PROTEIN: 6.3 g/dL (ref 6.0–8.3)
Total Bilirubin: 0.5 mg/dL (ref 0.2–1.2)

## 2014-12-16 LAB — LIPID PANEL
Cholesterol: 197 mg/dL (ref 0–200)
HDL: 37.2 mg/dL — ABNORMAL LOW (ref >39.00)
LDL CALC: 129 mg/dL — AB (ref 0–99)
NonHDL: 159.8
Total CHOL/HDL Ratio: 5
Triglycerides: 155 mg/dL — ABNORMAL HIGH (ref 0.0–149.0)
VLDL: 31 mg/dL (ref 0.0–40.0)

## 2014-12-16 LAB — TSH: TSH: 2.2 u[IU]/mL (ref 0.35–4.50)

## 2014-12-16 LAB — VITAMIN D 25 HYDROXY (VIT D DEFICIENCY, FRACTURES): VITD: 26.94 ng/mL — ABNORMAL LOW (ref 30.00–100.00)

## 2014-12-16 NOTE — Assessment & Plan Note (Signed)
Physical today.  See overview.  Due f/u colonoscopy this year.

## 2014-12-16 NOTE — Progress Notes (Signed)
Pre visit review using our clinic review tool, if applicable. No additional management support is needed unless otherwise documented below in the visit note. 

## 2014-12-16 NOTE — Assessment & Plan Note (Signed)
Blood pressure doing well on no medication.  Follow.   

## 2014-12-16 NOTE — Assessment & Plan Note (Signed)
S/p fall.  Doing better.  Only minimal discomfort with palpation.  No swelling.  Discussed further evaluation.  She declines.  Will let me know if problems.

## 2014-12-16 NOTE — Assessment & Plan Note (Signed)
Controlled on no medication.

## 2014-12-16 NOTE — Assessment & Plan Note (Signed)
Check vitamin D level today 

## 2014-12-16 NOTE — Progress Notes (Signed)
Patient ID: Carrie Wilson, female   DOB: 1956/07/15, 59 y.o.   MRN: [id number]   Subjective:    Patient ID: Carrie Wilson, female    DOB: Oct 05, 1956, 59 y.o.   MRN: [id number]  HPI  Patient here for her physical exam.  Has a history of hypercholesterolemia.  Previous elevated blood pressure.  On no medication now.  Blood pressure has been doing well.  She has adjusted her diet.  Trying to stay active.  Trying to lose some weight.    Past Medical History  Diagnosis Date  . Hyperlipidemia   . Hypertension   . GERD (gastroesophageal reflux disease)     neg H. pylori  . Vitamin D deficiency     Current Outpatient Prescriptions on File Prior to Visit  Medication Sig Dispense Refill  . Cholecalciferol (VITAMIN D-3) 1000 UNITS CAPS Take by mouth daily. 1 tab daily     No current facility-administered medications on file prior to visit.    Review of Systems  Constitutional: Negative for fatigue and unexpected weight change (pt is trying to lose weight.  has adjusted her diet.  ).  HENT: Negative for congestion and sinus pressure.   Eyes: Negative for discharge and redness.  Respiratory: Negative for cough, chest tightness and shortness of breath.   Cardiovascular: Negative for chest pain and palpitations.  Gastrointestinal: Negative for nausea, vomiting, abdominal pain and diarrhea.  Genitourinary: Negative for vaginal discharge and pelvic pain.  Musculoskeletal: Negative for back pain and joint swelling (had a fall in October.  slipped on water.  hurt her knees and her right shoulder.  bettter now.  no significant residual problems.  ).  Skin: Negative for color change and rash.  Neurological: Negative for dizziness, light-headedness and headaches.  Hematological: Negative for adenopathy. Does not bruise/bleed easily.  Psychiatric/Behavioral: Negative for behavioral problems. The patient is not nervous/anxious.        Objective:    Physical Exam  Constitutional: She is  oriented to person, place, and time. She appears well-developed and well-nourished.  HENT:  Nose: Nose normal.  Mouth/Throat: Oropharynx is clear and moist.  Eyes: Right eye exhibits no discharge. Left eye exhibits no discharge. No scleral icterus.  Neck: Neck supple. No thyromegaly present.  Cardiovascular: Normal rate and regular rhythm.   No murmur heard. Pulmonary/Chest: Breath sounds normal. No accessory muscle usage. No tachypnea. No respiratory distress. She has no decreased breath sounds. She has no wheezes. She has no rhonchi. Right breast exhibits no inverted nipple, no mass, no nipple discharge and no tenderness (no axillary adenopathy). Left breast exhibits no inverted nipple, no mass, no nipple discharge and no tenderness (no axilarry adenopathy).  Abdominal: Soft. Bowel sounds are normal. There is no tenderness.  Musculoskeletal: She exhibits tenderness. She exhibits no edema.  Minimal tenderness to palpation beneath the left knee.  No bruising.  No increased soft tissue swelling.  Good rom.    Lymphadenopathy:    She has no cervical adenopathy.  Neurological: She is alert and oriented to person, place, and time.  Skin: Skin is warm. No rash noted.  Psychiatric: She has a normal mood and affect. Her behavior is normal.    BP 110/80 mmHg  Pulse 81  Temp(Src) 98.3 F (36.8 C) (Oral)  Ht 5' 7.9" (1.725 m)  Wt 190 lb 4 oz (86.297 kg)  BMI 29.00 kg/m2  SpO2 96%  LMP 12/08/1997 Wt Readings from Last 3 Encounters:  12/16/14 190 lb 4 oz (86.297 kg)  06/14/14 193 lb (87.544 kg)  12/15/13 184 lb 8 oz (83.689 kg)     Lab Results  Component Value Date   WBC 7.5 06/14/2014   HGB 14.6 06/14/2014   HCT 44.0 06/14/2014   PLT 199.0 06/14/2014   GLUCOSE 93 06/14/2014   CHOL 189 06/14/2014   TRIG 110.0 06/14/2014   HDL 38.10* 06/14/2014   LDLDIRECT 139.4 12/15/2013   LDLCALC 129* 06/14/2014   ALT 12 06/14/2014   AST 18 06/14/2014   NA 136 06/14/2014   K 4.0 06/14/2014    CL 103 06/14/2014   CREATININE 0.9 06/14/2014   BUN 9 06/14/2014   CO2 28 06/14/2014   TSH 1.62 12/15/2013       Assessment & Plan:   Problem List Items Addressed This Visit    GERD (gastroesophageal reflux disease)    Controlled on no medication.        Health care maintenance    Physical today.  See overview.  Due f/u colonoscopy this year.        Hypercholesterolemia - Primary    Low cholesterol diet and exercise.  She is watching what she eats.  Losing weight.  Follow.  Check lipid panel today.       Relevant Orders   Comprehensive metabolic panel   Lipid panel   Hypertension    Blood pressure doing well on no medication.  Follow.       Left knee pain    S/p fall.  Doing better.  Only minimal discomfort with palpation.  No swelling.  Discussed further evaluation.  She declines.  Will let me know if problems.        Tobacco use    Discussed need to quit.  Discussed obtaining screening CT.        Vitamin D deficiency    Check vitamin D level today.       Relevant Orders   Vit D  25 hydroxy (rtn osteoporosis monitoring)   TSH     I spent 25 minutes with the patient and more than 50% of the time was spent in consultation regarding the above.     Dale Osnabrock, MD

## 2014-12-16 NOTE — Assessment & Plan Note (Signed)
Low cholesterol diet and exercise.  She is watching what she eats.  Losing weight.  Follow.  Check lipid panel today.

## 2014-12-16 NOTE — Assessment & Plan Note (Signed)
Discussed need to quit.  Discussed obtaining screening CT.

## 2014-12-17 ENCOUNTER — Encounter: Payer: Self-pay | Admitting: *Deleted

## 2015-02-16 ENCOUNTER — Ambulatory Visit
Admit: 2015-02-16 | Disposition: A | Discharge status: Home / Self Care | Payer: Self-pay | Attending: Internal Medicine | Admitting: Internal Medicine

## 2015-02-16 LAB — HM MAMMOGRAPHY: HM MAMMO: NEGATIVE

## 2015-03-01 ENCOUNTER — Ambulatory Visit
Admit: 2015-03-01 | Disposition: A | Discharge status: Home / Self Care | Payer: Self-pay | Attending: Family Medicine | Admitting: Family Medicine

## 2015-03-01 LAB — URINALYSIS, COMPLETE
Bilirubin,UR: NEGATIVE
GLUCOSE, UR: NEGATIVE
Ketone: NEGATIVE
NITRITE: NEGATIVE
Ph: 5.5 (ref 5.0–8.0)
Protein: NEGATIVE
Specific Gravity: >=1.03 (ref 1.000–1.030)

## 2015-03-04 LAB — URINE CULTURE

## 2015-03-08 ENCOUNTER — Encounter: Payer: Self-pay | Admitting: Internal Medicine

## 2015-06-16 ENCOUNTER — Ambulatory Visit
Admission: RE | Admit: 2015-06-16 | Discharge: 2015-06-16 | Disposition: A | Discharge status: Home / Self Care | Payer: 59 | Source: Ambulatory Visit | Attending: Internal Medicine | Admitting: Internal Medicine

## 2015-06-16 ENCOUNTER — Ambulatory Visit (INDEPENDENT_AMBULATORY_CARE_PROVIDER_SITE_OTHER): Payer: 59 | Admitting: Internal Medicine

## 2015-06-16 ENCOUNTER — Encounter: Payer: Self-pay | Admitting: Internal Medicine

## 2015-06-16 VITALS — BP 112/80 | HR 81 | Temp 98.2°F | Ht 67.9 in | Wt 192.1 lb

## 2015-06-16 DIAGNOSIS — E7800 Pure hypercholesterolemia, unspecified: Secondary | ICD-10-CM

## 2015-06-16 DIAGNOSIS — M25552 Pain in left hip: Secondary | ICD-10-CM

## 2015-06-16 DIAGNOSIS — E559 Vitamin D deficiency, unspecified: Secondary | ICD-10-CM

## 2015-06-16 DIAGNOSIS — K219 Gastro-esophageal reflux disease without esophagitis: Secondary | ICD-10-CM

## 2015-06-16 DIAGNOSIS — I10 Essential (primary) hypertension: Secondary | ICD-10-CM | POA: Diagnosis not present

## 2015-06-16 DIAGNOSIS — Z720 Tobacco use: Secondary | ICD-10-CM

## 2015-06-16 DIAGNOSIS — M810 Age-related osteoporosis without current pathological fracture: Secondary | ICD-10-CM | POA: Insufficient documentation

## 2015-06-16 DIAGNOSIS — Z0000 Encounter for general adult medical examination without abnormal findings: Secondary | ICD-10-CM

## 2015-06-16 LAB — CBC WITH DIFFERENTIAL/PLATELET
BASOS ABS: 0 10*3/uL (ref 0.0–0.1)
Basophils Relative: 0.5 % (ref 0.0–3.0)
Eosinophils Absolute: 0.1 10*3/uL (ref 0.0–0.7)
Eosinophils Relative: 1 % (ref 0.0–5.0)
HCT: 46.1 % — ABNORMAL HIGH (ref 36.0–46.0)
Hemoglobin: 15.4 g/dL — ABNORMAL HIGH (ref 12.0–15.0)
LYMPHS PCT: 24.6 % (ref 12.0–46.0)
Lymphs Abs: 1.8 10*3/uL (ref 0.7–4.0)
MCHC: 33.5 g/dL (ref 30.0–36.0)
MCV: 92.2 fl (ref 78.0–100.0)
MONO ABS: 0.5 10*3/uL (ref 0.1–1.0)
Monocytes Relative: 6.7 % (ref 3.0–12.0)
Neutro Abs: 5 10*3/uL (ref 1.4–7.7)
Neutrophils Relative %: 67.2 % (ref 43.0–77.0)
PLATELETS: 195 10*3/uL (ref 150.0–400.0)
RBC: 5 Mil/uL (ref 3.87–5.11)
RDW: 13.3 % (ref 11.5–15.5)
WBC: 7.4 10*3/uL (ref 4.0–10.5)

## 2015-06-16 LAB — COMPREHENSIVE METABOLIC PANEL
ALK PHOS: 83 U/L (ref 39–117)
ALT: 13 U/L (ref 0–35)
AST: 15 U/L (ref 0–37)
Albumin: 4 g/dL (ref 3.5–5.2)
BILIRUBIN TOTAL: 0.6 mg/dL (ref 0.2–1.2)
BUN: 8 mg/dL (ref 6–23)
CHLORIDE: 106 meq/L (ref 96–112)
CO2: 32 mEq/L (ref 19–32)
Calcium: 9.7 mg/dL (ref 8.4–10.5)
Creatinine, Ser: 0.84 mg/dL (ref 0.40–1.20)
GFR: 73.35 mL/min (ref >60.00)
Glucose, Bld: 95 mg/dL (ref 70–99)
POTASSIUM: 4.3 meq/L (ref 3.5–5.1)
SODIUM: 143 meq/L (ref 135–145)
Total Protein: 6.4 g/dL (ref 6.0–8.3)

## 2015-06-16 LAB — LIPID PANEL
Cholesterol: 187 mg/dL (ref 0–200)
HDL: 32.5 mg/dL — AB (ref >39.00)
LDL Cholesterol: 115 mg/dL — ABNORMAL HIGH (ref 0–99)
NonHDL: 154.38
Total CHOL/HDL Ratio: 6
Triglycerides: 197 mg/dL — ABNORMAL HIGH (ref 0.0–149.0)
VLDL: 39.4 mg/dL (ref 0.0–40.0)

## 2015-06-16 NOTE — Progress Notes (Signed)
Patient ID: Carrie Wilson, female   DOB: 1956/06/21, 59 y.o.   MRN: [id number]   Subjective:    Patient ID: Carrie Wilson, female    DOB: 08-16-1956, 59 y.o.   MRN: [id number]  HPI  Patient here for a scheduled follow up.  Tries to stay active.  No cardiac symptoms with increased activity or exertion.  No sob.  No increased cough or congestion.  Still smoking.  Discussed quitting.  She desires not to quit.  No acid reflux.  No abdominal pain or cramping.  Bowels stable.  Stress better.  She has noticed increased pain - left hip.  Persistent.  Extends to her left lower leg.  Taking alleve.     Past Medical History  Diagnosis Date  . Hyperlipidemia   . Hypertension   . GERD (gastroesophageal reflux disease)     neg H. pylori  . Vitamin D deficiency     Outpatient Encounter Prescriptions as of 06/16/2015  Medication Sig  . Cholecalciferol (VITAMIN D-3) 1000 UNITS CAPS Take by mouth daily. 1 tab daily   No facility-administered encounter medications on file as of 06/16/2015.    Review of Systems  Constitutional: Negative for appetite change and unexpected weight change.  HENT: Negative for congestion and sinus pressure.   Respiratory: Negative for cough, chest tightness and shortness of breath.   Cardiovascular: Negative for chest pain, palpitations and leg swelling.  Gastrointestinal: Negative for nausea, vomiting, abdominal pain and diarrhea.  Genitourinary: Negative for dysuria and difficulty urinating.  Musculoskeletal:       Left hip pain with pain extending down her left leg.  No known injury.   Skin: Negative for color change and rash.  Neurological: Negative for dizziness, light-headedness and headaches.  Psychiatric/Behavioral: Negative for dysphoric mood and agitation.       Objective:    Physical Exam  Constitutional: She appears well-developed and well-nourished. No distress.  HENT:  Nose: Nose normal.  Mouth/Throat: Oropharynx is clear and moist.    Neck: Neck supple. No thyromegaly present.  Cardiovascular: Normal rate and regular rhythm.   Pulmonary/Chest: Breath sounds normal. No respiratory distress. She has no wheezes.  Abdominal: Soft. Bowel sounds are normal. There is no tenderness.  Musculoskeletal: She exhibits no edema or tenderness.  Some minimal discomfort with abduction and rotation at the hip.    Lymphadenopathy:    She has no cervical adenopathy.  Skin: No rash noted. No erythema.  Psychiatric: She has a normal mood and affect. Her behavior is normal.    BP 112/80 mmHg  Pulse 81  Temp(Src) 98.2 F (36.8 C) (Oral)  Ht 5' 7.9" (1.725 m)  Wt 192 lb 2 oz (87.147 kg)  BMI 29.29 kg/m2  SpO2 94%  LMP 12/08/1997 Wt Readings from Last 3 Encounters:  06/16/15 192 lb 2 oz (87.147 kg)  12/16/14 190 lb 4 oz (86.297 kg)  06/14/14 193 lb (87.544 kg)     Lab Results  Component Value Date   WBC 7.4 06/16/2015   HGB 15.4* 06/16/2015   HCT 46.1* 06/16/2015   PLT 195.0 06/16/2015   GLUCOSE 95 06/16/2015   CHOL 187 06/16/2015   TRIG 197.0* 06/16/2015   HDL 32.50* 06/16/2015   LDLDIRECT 139.4 12/15/2013   LDLCALC 115* 06/16/2015   ALT 13 06/16/2015   AST 15 06/16/2015   NA 143 06/16/2015   K 4.3 06/16/2015   CL 106 06/16/2015   CREATININE 0.84 06/16/2015   BUN 8 06/16/2015   CO2 32  06/16/2015   TSH 2.20 12/16/2014       Assessment & Plan:   Problem List Items Addressed This Visit    GERD (gastroesophageal reflux disease)    Off nexium.  Controlled.        Relevant Orders   CBC with Differential/Platelet (Completed)   Health care maintenance    Physical 12/16/14.  Mammogram 01/26/15 - Birads I.  Colonoscopy 04/05/10.  Recommended f/u colonoscopy in five years.  Due.  PAP 12/15/13 - negative with negative HPV.        Hypercholesterolemia    Low cholesterol diet and exercise.  Follow lipid panel.        Relevant Orders   Lipid panel (Completed)   Hypertension    Has a history of hypertension.  Off  medication now and blood pressure doing well.  Follow pressures.        Relevant Orders   Comprehensive metabolic panel (Completed)   Left hip pain - Primary    Persistent pain.  Will check xray.  Further w/up and treatment pending results.        Relevant Orders   DG HIP UNILAT WITH PELVIS 2-3 VIEWS LEFT (Completed)   Tobacco use    Discussed the need to quit.  She is not ready to quit.  Follow.        Vitamin D deficiency    Follow vitamin d level.         I spent 25 minutes with the patient and more than 50% of the time was spent in consultation regarding the above.     Dale [place], MD

## 2015-06-16 NOTE — Progress Notes (Signed)
Pre visit review using our clinic review tool, if applicable. No additional management support is needed unless otherwise documented below in the visit note. 

## 2015-06-17 ENCOUNTER — Encounter: Payer: Self-pay | Admitting: *Deleted

## 2015-06-18 ENCOUNTER — Encounter: Payer: Self-pay | Admitting: Internal Medicine

## 2015-06-18 NOTE — Assessment & Plan Note (Signed)
Off nexium.  Controlled.

## 2015-06-18 NOTE — Assessment & Plan Note (Signed)
Persistent pain.  Will check xray.  Further w/up and treatment pending results.

## 2015-06-18 NOTE — Assessment & Plan Note (Signed)
Has a history of hypertension.  Off medication now and blood pressure doing well.  Follow pressures.

## 2015-06-18 NOTE — Assessment & Plan Note (Signed)
Physical 12/16/14.  Mammogram 01/26/15 - Birads I.  Colonoscopy 04/05/10.  Recommended f/u colonoscopy in five years.  Due.  PAP 12/15/13 - negative with negative HPV.

## 2015-06-18 NOTE — Assessment & Plan Note (Signed)
Low cholesterol diet and exercise.  Follow lipid panel.   

## 2015-06-18 NOTE — Assessment & Plan Note (Signed)
Follow vitamin d level.   

## 2015-06-18 NOTE — Assessment & Plan Note (Signed)
Discussed the need to quit.  She is not ready to quit.  Follow.

## 2015-06-26 ENCOUNTER — Other Ambulatory Visit: Payer: Self-pay | Admitting: Internal Medicine

## 2015-06-26 DIAGNOSIS — M25552 Pain in left hip: Secondary | ICD-10-CM

## 2015-06-26 NOTE — Progress Notes (Signed)
Order placed for ortho referral.   

## 2015-11-13 ENCOUNTER — Encounter: Payer: Self-pay | Admitting: Gynecology

## 2015-11-13 ENCOUNTER — Ambulatory Visit
Admission: EM | Admit: 2015-11-13 | Discharge: 2015-11-13 | Disposition: A | Discharge status: Home / Self Care | Payer: 59 | Attending: Family Medicine | Admitting: Family Medicine

## 2015-11-13 DIAGNOSIS — J0110 Acute frontal sinusitis, unspecified: Secondary | ICD-10-CM

## 2015-11-13 DIAGNOSIS — J0100 Acute maxillary sinusitis, unspecified: Secondary | ICD-10-CM | POA: Diagnosis not present

## 2015-11-13 MED ORDER — DOXYCYCLINE HYCLATE 100 MG PO CAPS: 100.0000 mg | ORAL_CAPSULE | Freq: Two times a day (BID) | ORAL | Status: DC

## 2015-11-13 NOTE — ED Notes (Signed)
Patient c/o x couple days  Facial pressure/ nasal drainage/ ear ache.

## 2015-11-13 NOTE — ED Provider Notes (Signed)
[facility]  ____________________________________________  Time seen: Approximately 12:32 PM  I have reviewed the triage vital signs and the nursing notes.   HISTORY  Chief Complaint Facial Pain  HPI Carrie Wilson is a 60 y.o. female presents for complaints of runny nose, nasal congestion and sinus pain 4 days. Patient reports that prior to this she has had runny nose and congestion 1.5 weeks. Patient states that she was getting thick drainage out of her nose but states now her sinuses feel clogged. States now the only time that she can get sinus drainage is when she stands in the hot shower for a long time and states then she is getting very thick green nasal drainage. Patient states that when she she gets some drainage out of it helps with the pressure discomfort. Patient states that current sinus discomfort is 4 out of 10 aching. Denies pain radiation. Denies cough at this time.  Denies chest pain, shortness of breath, abdominal pain, weakness, fevers or other complaints. Reports continues to eat and drink well. Denies known sick contacts. Denies recent sickness or recent antibiotic use.    Past Medical History  Diagnosis Date  . Hyperlipidemia   . Hypertension   . GERD (gastroesophageal reflux disease)     neg H. pylori  . Vitamin D deficiency    PCP: Dr. Nicki Reaper  Patient Active Problem List   Diagnosis Date Noted  . Left hip pain 06/16/2015  . Left knee pain 12/16/2014  . Health care maintenance 12/16/2014  . Tobacco use 06/20/2014  . GERD (gastroesophageal reflux disease) 12/10/2012  . Hypertension 12/10/2012  . Vitamin D deficiency 12/10/2012  . Hypercholesterolemia 12/08/2012    Past Surgical History  Procedure Laterality Date  . Cholecystectomy  1994  . Tubal ligation  1995  . Rectal fissure repair  2002    Current Outpatient Rx  Name  Route  Sig  Dispense  Refill  . Cholecalciferol (VITAMIN D-3) 1000 UNITS CAPS   Oral   Take by mouth  daily. 1 tab daily         .            postmenopausal  Allergies Codeine sulfate and Penicillins  Family History  Problem Relation Age of Onset  . Hypertension Mother   . Heart disease Mother   . Hypercholesterolemia Brother   . Colon cancer Neg Hx   . Breast cancer Neg Hx     Social History Social History  Substance Use Topics  . Smoking status: Current Every Day Smoker  . Smokeless tobacco: Never Used  . Alcohol Use: No    Review of Systems Constitutional: No fever/chills Eyes: No visual changes. ENT: No sore throat. positive runny nose, nasal congestion and sinus pressure and sinus pain. Cardiovascular: Denies chest pain. Respiratory: Denies shortness of breath. Gastrointestinal: No abdominal pain.  No nausea, no vomiting.  No diarrhea.  No constipation. Genitourinary: Negative for dysuria. Musculoskeletal: Negative for back pain. Skin: Negative for rash. Neurological: Negative for headaches, focal weakness or numbness.  10-point ROS otherwise negative.  ____________________________________________   PHYSICAL EXAM:  VITAL SIGNS: ED Triage Vitals  Enc Vitals Group     BP 11/13/15 1136 145/69 mmHg     Pulse Rate 11/13/15 1136 87     Resp 11/13/15 1136 16     Temp 11/13/15 1136 98 F (36.7 C)     Temp Source 11/13/15 1136 Oral     SpO2 11/13/15 1136 98 %  Weight 11/13/15 1136 182 lb (82.555 kg)     Height 11/13/15 1136 5\' 8"  (1.727 m)     Head Cir --      Peak Flow --      Pain Score 11/13/15 1136 8     Pain Loc --      Pain Edu? --      Excl. in [place]? --     Constitutional: Alert and oriented. Well appearing and in no acute distress. Eyes: Conjunctivae are normal. PERRL. EOMI. Head: Atraumatic.moderate tenderness to palpation bilateral frontal and maxillary sinuses, no swelling. No erythema.  Ears: no erythema, normal TMs bilaterally.   Nose: nasal congestion and bilateral nasal turbinate erythema and edema.  Mouth/Throat: Mucous membranes  are moist.  Oropharynx non-erythematous.no tonsillar swelling or exudate. Neck: No stridor.  No cervical spine tenderness to palpation. Hematological/Lymphatic/Immunilogical: No cervical lymphadenopathy. Cardiovascular: Normal rate, regular rhythm. Grossly normal heart sounds.  Good peripheral circulation. Respiratory: Normal respiratory effort.  No retractions. Lungs CTAB.no wheezes, rales or rhonchi. Good air movement. Gastrointestinal: Soft and nontender.  Musculoskeletal: No lower or upper extremity tenderness nor edema.  Neurologic:  Normal speech and language. No gross focal neurologic deficits are appreciated. No gait instability. Skin:  Skin is warm, dry and intact. No rash noted. Psychiatric: Mood and affect are normal. Speech and behavior are normal.  ____________________________________________   LABS (all labs ordered are listed, but only abnormal results are displayed)  Labs Reviewed - No data to display   INITIAL IMPRESSION / ASSESSMENT AND PLAN / ED COURSE  Pertinent labs & imaging results that were available during my care of the patient were reviewed by me and considered in my medical decision making (see chart for details).  Well-appearing patient. No acute distress. Presents for the complaints of sinus drainage, sinus pressure, nasal congestion. Lungs clear throughout. Will treat sinusitis with oral doxycycline, supportive treatments including fluids and rest. Encourage use of rest and saline rinses and Nettie Pott, as patient reports she has this at home.. Follow-up closely with primary care physician.  Discussed follow up with Primary care physician this week. Discussed follow up and return parameters including no resolution or any worsening concerns. Patient verbalized understanding and agreed to plan.   ____________________________________________   FINAL CLINICAL IMPRESSION(S) / ED DIAGNOSES  Final diagnoses:  Acute maxillary sinusitis, recurrence not  specified  Acute frontal sinusitis, recurrence not specified       [name], NP 11/13/15 1246

## 2015-11-13 NOTE — Discharge Instructions (Signed)
Take medication as prescribed. Rest. Drink plenty of fluids.continue use of home medications. Use sinus rinses and Nettie pot  Follow-up with your primary care physician this week as needed. Return to urgent care as needed for new or worsening concerns.     Sinusitis, Adult Sinusitis is redness, soreness, and inflammation of the paranasal sinuses. Paranasal sinuses are air pockets within the bones of your face. They are located beneath your eyes, in the middle of your forehead, and above your eyes. In healthy paranasal sinuses, mucus is able to drain out, and air is able to circulate through them by way of your nose. However, when your paranasal sinuses are inflamed, mucus and air can become trapped. This can allow bacteria and other germs to grow and cause infection. Sinusitis can develop quickly and last only a short time (acute) or continue over a long period (chronic). Sinusitis that lasts for more than 12 weeks is considered chronic. CAUSES Causes of sinusitis include:  Allergies.  Structural abnormalities, such as displacement of the cartilage that separates your nostrils (deviated septum), which can decrease the air flow through your nose and sinuses and affect sinus drainage.  Functional abnormalities, such as when the small hairs (cilia) that line your sinuses and help remove mucus do not work properly or are not present. SIGNS AND SYMPTOMS Symptoms of acute and chronic sinusitis are the same. The primary symptoms are pain and pressure around the affected sinuses. Other symptoms include:  Upper toothache.  Earache.  Headache.  Bad breath.  Decreased sense of smell and taste.  A cough, which worsens when you are lying flat.  Fatigue.  Fever.  Thick drainage from your nose, which often is green and may contain pus (purulent).  Swelling and warmth over the affected sinuses. DIAGNOSIS Your health care provider will perform a physical exam. During your exam, your health  care provider may perform any of the following to help determine if you have acute sinusitis or chronic sinusitis:  Look in your nose for signs of abnormal growths in your nostrils (nasal polyps).  Tap over the affected sinus to check for signs of infection.  View the inside of your sinuses using an imaging device that has a light attached (endoscope). If your health care provider suspects that you have chronic sinusitis, one or more of the following tests may be recommended:  Allergy tests.  Nasal culture. A sample of mucus is taken from your nose, sent to a lab, and screened for bacteria.  Nasal cytology. A sample of mucus is taken from your nose and examined by your health care provider to determine if your sinusitis is related to an allergy. TREATMENT Most cases of acute sinusitis are related to a viral infection and will resolve on their own within 10 days. Sometimes, medicines are prescribed to help relieve symptoms of both acute and chronic sinusitis. These may include pain medicines, decongestants, nasal steroid sprays, or saline sprays. However, for sinusitis related to a bacterial infection, your health care provider will prescribe antibiotic medicines. These are medicines that will help kill the bacteria causing the infection. Rarely, sinusitis is caused by a fungal infection. In these cases, your health care provider will prescribe antifungal medicine. For some cases of chronic sinusitis, surgery is needed. Generally, these are cases in which sinusitis recurs more than 3 times per year, despite other treatments. HOME CARE INSTRUCTIONS  Drink plenty of water. Water helps thin the mucus so your sinuses can drain more easily.  Use a humidifier.  Inhale steam 3-4 times a day (for example, sit in the bathroom with the shower running).  Apply a warm, moist washcloth to your face 3-4 times a day, or as directed by your health care provider.  Use saline nasal sprays to help moisten  and clean your sinuses.  Take medicines only as directed by your health care provider.  If you were prescribed either an antibiotic or antifungal medicine, finish it all even if you start to feel better. SEEK IMMEDIATE MEDICAL CARE IF:  You have increasing pain or severe headaches.  You have nausea, vomiting, or drowsiness.  You have swelling around your face.  You have vision problems.  You have a stiff neck.  You have difficulty breathing.   This information is not intended to replace advice given to you by your health care provider. Make sure you discuss any questions you have with your health care provider.   Document Released: 10/29/2005 Document Revised: 11/19/2014 Document Reviewed: 11/13/2011 Elsevier Interactive Patient Education [employer].

## 2015-12-19 ENCOUNTER — Encounter: Payer: Self-pay | Admitting: Internal Medicine

## 2015-12-19 ENCOUNTER — Ambulatory Visit (INDEPENDENT_AMBULATORY_CARE_PROVIDER_SITE_OTHER): Payer: 59 | Admitting: Internal Medicine

## 2015-12-19 VITALS — BP 110/82 | HR 80 | Temp 98.1°F | Resp 18 | Ht 67.9 in | Wt 187.5 lb

## 2015-12-19 DIAGNOSIS — Z0000 Encounter for general adult medical examination without abnormal findings: Secondary | ICD-10-CM | POA: Diagnosis not present

## 2015-12-19 DIAGNOSIS — E559 Vitamin D deficiency, unspecified: Secondary | ICD-10-CM | POA: Diagnosis not present

## 2015-12-19 DIAGNOSIS — E7800 Pure hypercholesterolemia, unspecified: Secondary | ICD-10-CM

## 2015-12-19 DIAGNOSIS — Z1239 Encounter for other screening for malignant neoplasm of breast: Secondary | ICD-10-CM | POA: Diagnosis not present

## 2015-12-19 DIAGNOSIS — Z720 Tobacco use: Secondary | ICD-10-CM

## 2015-12-19 DIAGNOSIS — K219 Gastro-esophageal reflux disease without esophagitis: Secondary | ICD-10-CM

## 2015-12-19 LAB — COMPREHENSIVE METABOLIC PANEL
ALK PHOS: 84 U/L (ref 39–117)
ALT: 11 U/L (ref 0–35)
AST: 12 U/L (ref 0–37)
Albumin: 4.1 g/dL (ref 3.5–5.2)
BILIRUBIN TOTAL: 0.5 mg/dL (ref 0.2–1.2)
BUN: 10 mg/dL (ref 6–23)
CO2: 29 mEq/L (ref 19–32)
Calcium: 9.4 mg/dL (ref 8.4–10.5)
Chloride: 106 mEq/L (ref 96–112)
Creatinine, Ser: 0.87 mg/dL (ref 0.40–1.20)
GFR: 70.32 mL/min (ref >60.00)
GLUCOSE: 103 mg/dL — AB (ref 70–99)
Potassium: 4.3 mEq/L (ref 3.5–5.1)
Sodium: 141 mEq/L (ref 135–145)
TOTAL PROTEIN: 6.9 g/dL (ref 6.0–8.3)

## 2015-12-19 LAB — LIPID PANEL
CHOL/HDL RATIO: 5
Cholesterol: 195 mg/dL (ref 0–200)
HDL: 37.1 mg/dL — AB (ref >39.00)
LDL Cholesterol: 131 mg/dL — ABNORMAL HIGH (ref 0–99)
NonHDL: 157.92
TRIGLYCERIDES: 134 mg/dL (ref 0.0–149.0)
VLDL: 26.8 mg/dL (ref 0.0–40.0)

## 2015-12-19 LAB — CBC WITH DIFFERENTIAL/PLATELET
BASOS ABS: 0 10*3/uL (ref 0.0–0.1)
Basophils Relative: 0.4 % (ref 0.0–3.0)
EOS PCT: 0.8 % (ref 0.0–5.0)
Eosinophils Absolute: 0.1 10*3/uL (ref 0.0–0.7)
HCT: 45.8 % (ref 36.0–46.0)
HEMOGLOBIN: 15.1 g/dL — AB (ref 12.0–15.0)
LYMPHS ABS: 2 10*3/uL (ref 0.7–4.0)
Lymphocytes Relative: 24.9 % (ref 12.0–46.0)
MCHC: 33 g/dL (ref 30.0–36.0)
MCV: 91.9 fl (ref 78.0–100.0)
Monocytes Absolute: 0.6 10*3/uL (ref 0.1–1.0)
Monocytes Relative: 6.8 % (ref 3.0–12.0)
NEUTROS PCT: 67.1 % (ref 43.0–77.0)
Neutro Abs: 5.5 10*3/uL (ref 1.4–7.7)
Platelets: 195 10*3/uL (ref 150.0–400.0)
RBC: 4.99 Mil/uL (ref 3.87–5.11)
RDW: 13.8 % (ref 11.5–15.5)
WBC: 8.2 10*3/uL (ref 4.0–10.5)

## 2015-12-19 LAB — TSH: TSH: 1.41 u[IU]/mL (ref 0.35–4.50)

## 2015-12-19 LAB — VITAMIN D 25 HYDROXY (VIT D DEFICIENCY, FRACTURES): VITD: 29.24 ng/mL — ABNORMAL LOW (ref 30.00–100.00)

## 2015-12-19 NOTE — Assessment & Plan Note (Signed)
Discussed the need to stop smoking.  She desires not to quit at this time.  Follow.   

## 2015-12-19 NOTE — Assessment & Plan Note (Signed)
On no medication regularly.  No acid reflux symptoms.

## 2015-12-19 NOTE — Assessment & Plan Note (Signed)
Physical today 12/19/15.  Mammogram 01/26/15 - Birads I.  Scheduled for mammogram for 2017.  Colonoscopy 04/05/10.  Recommended f/u colonoscopy in five years.  She has not received notice.  Will contact GI.  PAP 12/15/13 - negative with negative HPV.

## 2015-12-19 NOTE — Assessment & Plan Note (Signed)
Continue vitamin D supplements.  Check vitamin D level today.  

## 2015-12-19 NOTE — Progress Notes (Signed)
Pre-visit discussion using our clinic review tool. No additional management support is needed unless otherwise documented below in the visit note.  

## 2015-12-19 NOTE — Assessment & Plan Note (Signed)
Low cholesterol diet and exercise.  Follow lipid panel.  Check with labs today.

## 2015-12-19 NOTE — Progress Notes (Signed)
Patient ID: Genesi Buffin, female   DOB: 05-11-1956, 60 y.o.   MRN: [id number]   Subjective:    Patient ID: [name], female    DOB: Jan 11, 1956, 60 y.o.   MRN: [id number]  HPI  Patient with past history of hypercholesterolemia, GERD and hypertension.  She comes in today to follow up on these issues as well as for a complete physical exam.   She is doing well.  Feels good.  Stays active.  No cardiac symptoms with increased activity or exertion.  No sob.  Breathing stable.  No acid reflux.  No abdominal pain or cramping.  No bowel change.  No blood in the stool.  Some increased stress recently.  Brother-n-law passed away suddenly - this weekend.  States she is doing ok.     Past Medical History  Diagnosis Date  . Hyperlipidemia   . Hypertension   . GERD (gastroesophageal reflux disease)     neg H. pylori  . Vitamin D deficiency    Past Surgical History  Procedure Laterality Date  . Cholecystectomy  1994  . Tubal ligation  1995  . Rectal fissure repair  2002   Family History  Problem Relation Age of Onset  . Hypertension Mother   . Heart disease Mother   . Hypercholesterolemia Brother   . Colon cancer Neg Hx   . Breast cancer Neg Hx    Social History   Social History  . Marital Status: Married    Spouse Name: N/A  . Number of Children: 1  . Years of Education: N/A   Social History Main Topics  . Smoking status: Current Every Day Smoker  . Smokeless tobacco: Never Used  . Alcohol Use: No  . Drug Use: No  . Sexual Activity: Not Asked   Other Topics Concern  . None   Social History Narrative    Outpatient Encounter Prescriptions as of 12/19/2015  Medication Sig  . Cholecalciferol (VITAMIN D-3) 1000 UNITS CAPS Take by mouth daily. 1 tab daily  . [DISCONTINUED] doxycycline (VIBRAMYCIN) 100 MG capsule Take 1 capsule (100 mg total) by mouth 2 (two) times daily.   No facility-administered encounter medications on file as of 12/19/2015.    Review of  Systems  Constitutional: Negative for appetite change and unexpected weight change.  HENT: Negative for congestion and sinus pressure.   Eyes: Negative for pain and visual disturbance.  Respiratory: Negative for cough, chest tightness and shortness of breath.   Cardiovascular: Negative for chest pain, palpitations and leg swelling.  Gastrointestinal: Negative for nausea, vomiting, abdominal pain and diarrhea.  Genitourinary: Negative for dysuria and difficulty urinating.  Musculoskeletal: Negative for back pain and joint swelling.  Skin: Negative for color change and rash.  Neurological: Negative for dizziness, light-headedness and headaches.  Hematological: Negative for adenopathy. Does not bruise/bleed easily.  Psychiatric/Behavioral: Negative for dysphoric mood and agitation.       Objective:     Blood pressure rechecked by me:  118/78  Physical Exam  Constitutional: She is oriented to person, place, and time. She appears well-developed and well-nourished. No distress.  HENT:  Nose: Nose normal.  Mouth/Throat: Oropharynx is clear and moist.  Eyes: Right eye exhibits no discharge. Left eye exhibits no discharge. No scleral icterus.  Neck: Neck supple. No thyromegaly present.  Cardiovascular: Normal rate and regular rhythm.   Pulmonary/Chest: Breath sounds normal. No accessory muscle usage. No tachypnea. No respiratory distress. She has no decreased breath sounds. She has no wheezes. She  has no rhonchi. Right breast exhibits no inverted nipple, no mass, no nipple discharge and no tenderness (no axillary adenopathy). Left breast exhibits no inverted nipple, no mass, no nipple discharge and no tenderness (no axilarry adenopathy).  Abdominal: Soft. Bowel sounds are normal. There is no tenderness.  Musculoskeletal: She exhibits no edema or tenderness.  Lymphadenopathy:    She has no cervical adenopathy.  Neurological: She is alert and oriented to person, place, and time.  Skin: Skin is  warm. No rash noted. No erythema.  Psychiatric: She has a normal mood and affect. Her behavior is normal.    BP 110/82 mmHg  Pulse 80  Temp(Src) 98.1 F (36.7 C) (Oral)  Resp 18  Ht 5' 7.9" (1.725 m)  Wt 187 lb 8 oz (85.049 kg)  BMI 28.58 kg/m2  SpO2 96%  LMP 12/08/1997 Wt Readings from Last 3 Encounters:  12/19/15 187 lb 8 oz (85.049 kg)  11/13/15 182 lb (82.555 kg)  06/16/15 192 lb 2 oz (87.147 kg)     Lab Results  Component Value Date   WBC 8.2 12/19/2015   HGB 15.1* 12/19/2015   HCT 45.8 12/19/2015   PLT 195.0 12/19/2015   GLUCOSE 103* 12/19/2015   CHOL 195 12/19/2015   TRIG 134.0 12/19/2015   HDL 37.10* 12/19/2015   LDLDIRECT 139.4 12/15/2013   LDLCALC 131* 12/19/2015   ALT 11 12/19/2015   AST 12 12/19/2015   NA 141 12/19/2015   K 4.3 12/19/2015   CL 106 12/19/2015   CREATININE 0.87 12/19/2015   BUN 10 12/19/2015   CO2 29 12/19/2015   TSH 1.41 12/19/2015       Assessment & Plan:   Problem List Items Addressed This Visit    GERD (gastroesophageal reflux disease)    On no medication regularly.  No acid reflux symptoms.        Health care maintenance    Physical today 12/19/15.  Mammogram 01/26/15 - Birads I.  Scheduled for mammogram for 2017.  Colonoscopy 04/05/10.  Recommended f/u colonoscopy in five years.  She has not received notice.  Will contact GI.  PAP 12/15/13 - negative with negative HPV.        Hypercholesterolemia    Low cholesterol diet and exercise.  Follow lipid panel.  Check with labs today.        Relevant Orders   CBC with Differential/Platelet (Completed)   Comprehensive metabolic panel (Completed)   TSH (Completed)   Lipid panel (Completed)   Tobacco use    Discussed the need to stop smoking.  She desires not to quit at this time.  Follow.        Vitamin D deficiency    Continue vitamin D supplements.  Check vitamin D level today.        Relevant Orders   VITAMIN D 25 Hydroxy (Vit-D Deficiency, Fractures) (Completed)      Other Visit Diagnoses    Routine general medical examination at a health care facility    -  Primary    Screening breast examination        Relevant Orders    MM DIGITAL SCREENING BILATERAL        Dale Paullina, MD

## 2015-12-20 ENCOUNTER — Encounter: Payer: Self-pay | Admitting: *Deleted

## 2016-01-02 ENCOUNTER — Encounter: Payer: Self-pay | Admitting: Internal Medicine

## 2016-02-07 ENCOUNTER — Telehealth: Payer: Self-pay | Admitting: *Deleted

## 2016-02-07 NOTE — Telephone Encounter (Signed)
Received referral for low dose lung cancer screening CT scan . Voicemail left at home phone number listed in EMR for patient to call me back to facilitate scheduling scan.

## 2016-02-20 ENCOUNTER — Ambulatory Visit
Admission: RE | Admit: 2016-02-20 | Discharge: 2016-02-20 | Disposition: A | Discharge status: Home / Self Care | Payer: 59 | Source: Ambulatory Visit | Attending: Internal Medicine | Admitting: Internal Medicine

## 2016-02-20 DIAGNOSIS — Z1231 Encounter for screening mammogram for malignant neoplasm of breast: Secondary | ICD-10-CM | POA: Insufficient documentation

## 2016-02-20 DIAGNOSIS — Z1239 Encounter for other screening for malignant neoplasm of breast: Secondary | ICD-10-CM

## 2016-03-11 ENCOUNTER — Ambulatory Visit
Admission: EM | Admit: 2016-03-11 | Discharge: 2016-03-11 | Disposition: A | Discharge status: Home / Self Care | Payer: 59 | Attending: Family Medicine | Admitting: Family Medicine

## 2016-03-11 DIAGNOSIS — J309 Allergic rhinitis, unspecified: Secondary | ICD-10-CM | POA: Diagnosis not present

## 2016-03-11 DIAGNOSIS — J0100 Acute maxillary sinusitis, unspecified: Secondary | ICD-10-CM

## 2016-03-11 MED ORDER — DOXYCYCLINE HYCLATE 100 MG PO CAPS: 100.0000 mg | ORAL_CAPSULE | Freq: Two times a day (BID) | ORAL | Status: DC

## 2016-03-11 MED ORDER — PREDNISONE 20 MG PO TABS: 40.0000 mg | ORAL_TABLET | Freq: Every day | ORAL | Status: DC

## 2016-03-11 NOTE — ED Provider Notes (Signed)
[facility]  ____________________________________________  Time seen: Approximately 11:09 AM  I have reviewed the triage vital signs and the nursing notes.   HISTORY  Chief Complaint Sinusitis  HPI Carrie Wilson is a 60 y.o. female presents with complaints of 2 weeks of runny nose, nasal congestion, sinus pressure and sinus drainage. Patient reports that she does have seasonal allergies and frequently has a flare up of her seasonal allergies this time her care. However reports that her allergy medicine Flonase is not helping. Patient reports that she now has constant pressure in her front of her forehead and her cheeks and that her face feels clogged with fluid. Patient states that both of her ears also feel clogged with fluid. Denies hearing changes. Reports that she did plan airplane this week which states that increase the congestion for her. Patient reports that her symptoms are consistent with her previous sinus infections. Denies known fevers. Reports has been also with similar home. Reports occasional cough with associated postnasal drainage. Denies fevers. Reports continues to eat and drink well.  Denies chest pain or shortness of breath, abdominal pain, dysuria, neck pain, back pain, dizziness, weakness vision changes or hearing changes. Denies trauma.  PCP Dr. Nicki Reaper.   Past Medical History  Diagnosis Date  . Hyperlipidemia   . Hypertension   . GERD (gastroesophageal reflux disease)     neg H. pylori  . Vitamin D deficiency     Patient Active Problem List   Diagnosis Date Noted  . Left hip pain 06/16/2015  . Left knee pain 12/16/2014  . Health care maintenance 12/16/2014  . Tobacco use 06/20/2014  . GERD (gastroesophageal reflux disease) 12/10/2012  . Hypertension 12/10/2012  . Vitamin D deficiency 12/10/2012  . Hypercholesterolemia 12/08/2012    Past Surgical History  Procedure Laterality Date  . Cholecystectomy  1994  . Tubal ligation  1995   . Rectal fissure repair  2002    Current Outpatient Rx  Name  Route  Sig  Dispense  Refill  . Cholecalciferol (VITAMIN D-3) 1000 UNITS CAPS   Oral   Take by mouth daily. 1 tab daily           Allergies Codeine sulfate and Penicillins  Family History  Problem Relation Age of Onset  . Hypertension Mother   . Heart disease Mother   . Hypercholesterolemia Brother   . Colon cancer Neg Hx   . Breast cancer Neg Hx     Social History Social History  Substance Use Topics  . Smoking status: Current Every Day Smoker -- 0.50 packs/day for 30 years    Types: Cigarettes  . Smokeless tobacco: Never Used  . Alcohol Use: No    Review of Systems Constitutional: No fever/chills Eyes: No visual changes. ENT: As above. Cardiovascular: Denies chest pain. Respiratory: Denies shortness of breath. Gastrointestinal: No abdominal pain.  No nausea, no vomiting.  No diarrhea.  No constipation. Genitourinary: Negative for dysuria. Musculoskeletal: Negative for back pain. Skin: Negative for rash. Neurological: Negative for headaches, focal weakness or numbness.  10-point ROS otherwise negative.  ____________________________________________   PHYSICAL EXAM:  VITAL SIGNS: ED Triage Vitals  Enc Vitals Group     BP 03/11/16 1055 123/78 mmHg     Pulse Rate 03/11/16 1055 88     Resp 03/11/16 1055 16     Temp 03/11/16 1055 97.7 F (36.5 C)     Temp Source 03/11/16 1055 Tympanic     SpO2 03/11/16 1055 97 %  Weight 03/11/16 1055 182 lb (82.555 kg)     Height 03/11/16 1055 5\' 8"  (1.727 m)     Head Cir --      Peak Flow --      Pain Score 03/11/16 1057 7     Pain Loc --      Pain Edu? --      Excl. in [place]? --    Constitutional: Alert and oriented. Well appearing and in no acute distress. Eyes: Conjunctivae are normal. PERRL. EOMI. Head: Atraumatic.Mild to moderate tenderness to palpation bilateral maxillary sinuses, Mild bilateral frontal sinus tenderness to palpation. No  swelling. No erythema.   Ears: no erythema, normal TMs bilaterally.   Nose: nasal congestion with bilateral nasal turbinate erythema and edema.   Mouth/Throat: Mucous membranes are moist.  Oropharynx non-erythematous.No tonsillar swelling or exudate.  Neck: No stridor.  No cervical spine tenderness to palpation. Hematological/Lymphatic/Immunilogical: No cervical lymphadenopathy. Cardiovascular: Normal rate, regular rhythm. Grossly normal heart sounds.  Good peripheral circulation. Respiratory: Normal respiratory effort.  No retractions. Lungs CTAB. No wheezes, rales or rhonchi. Good air movement.  Gastrointestinal: Soft and nontender. No distention. Normal Bowel sounds. No CVA tenderness. Musculoskeletal: No lower or upper extremity tenderness nor edema.  Bilateral pedal pulses equal and easily palpated. No cervical, thoracic or lumbar tenderness to palpation.  Neurologic:  Normal speech and language. No gross focal neurologic deficits are appreciated. No gait instability. Skin:  Skin is warm, dry and intact. No rash noted. Psychiatric: Mood and affect are normal. Speech and behavior are normal.  ____________________________________________   LABS (all labs ordered are listed, but only abnormal results are displayed)  Labs Reviewed - No data to display ____________________________________________  INITIAL IMPRESSION / ASSESSMENT AND PLAN / ED COURSE  Pertinent labs & imaging results that were available during my care of the patient were reviewed by me and considered in my medical decision making (see chart for details).  Very well-appearing patient. No acute distress. Presents for the complaints of 2 weeks of runny nose, nasal congestion, sinus pressure and sinus drainage. Suspect allergic rhinitis and maxillary sinusitis and will treat patient with oral doxycycline, continue home Flonase, patient reports she does not tolerate oral antihistamines well, saline rinses and netti pot.  Encourage rest, fluids and PCP follow-up. Discussed indication, risks and benefits of medications with patient. Discussed photosensitivity with doxycycline and encouraged sunscreen and avoidance of excessive sun exposure.   Discussed follow up with Primary care physician this week. Discussed follow up and return parameters including no resolution or any worsening concerns. Patient verbalized understanding and agreed to plan.   ____________________________________________   FINAL CLINICAL IMPRESSION(S) / ED DIAGNOSES  Final diagnoses:  Acute maxillary sinusitis, recurrence not specified  Allergic rhinitis, unspecified allergic rhinitis type      Note: This dictation was prepared with Dragon dictation along with smaller phrase technology. Any transcriptional errors that result from this process are unintentional.    [name], NP 03/11/16 1209

## 2016-03-11 NOTE — Discharge Instructions (Signed)
Take medication as prescribed. Rest. Drink plenty of fluids.   Follow up with your primary care physician this week as needed. Return to Urgent care for new or worsening concerns.    Allergic Rhinitis Allergic rhinitis is when the mucous membranes in the nose respond to allergens. Allergens are particles in the air that cause your body to have an allergic reaction. This causes you to release allergic antibodies. Through a chain of events, these eventually cause you to release histamine into the blood stream. Although meant to protect the body, it is this release of histamine that causes your discomfort, such as frequent sneezing, congestion, and an itchy, runny nose.  CAUSES Seasonal allergic rhinitis (hay fever) is caused by pollen allergens that may come from grasses, trees, and weeds. Year-round allergic rhinitis (perennial allergic rhinitis) is caused by allergens such as house dust mites, pet dander, and mold spores. SYMPTOMS  Nasal stuffiness (congestion).  Itchy, runny nose with sneezing and tearing of the eyes. DIAGNOSIS Your health care provider can help you determine the allergen or allergens that trigger your symptoms. If you and your health care provider are unable to determine the allergen, skin or blood testing may be used. Your health care provider will diagnose your condition after taking your health history and performing a physical exam. Your health care provider may assess you for other related conditions, such as asthma, pink eye, or an ear infection. TREATMENT Allergic rhinitis does not have a cure, but it can be controlled by:  Medicines that block allergy symptoms. These may include allergy shots, nasal sprays, and oral antihistamines.  Avoiding the allergen. Hay fever may often be treated with antihistamines in pill or nasal spray forms. Antihistamines block the effects of histamine. There are over-the-counter medicines that may help with nasal congestion and swelling  around the eyes. Check with your health care provider before taking or giving this medicine. If avoiding the allergen or the medicine prescribed do not work, there are many new medicines your health care provider can prescribe. Stronger medicine may be used if initial measures are ineffective. Desensitizing injections can be used if medicine and avoidance does not work. Desensitization is when a patient is given ongoing shots until the body becomes less sensitive to the allergen. Make sure you follow up with your health care provider if problems continue. HOME CARE INSTRUCTIONS It is not possible to completely avoid allergens, but you can reduce your symptoms by taking steps to limit your exposure to them. It helps to know exactly what you are allergic to so that you can avoid your specific triggers. SEEK MEDICAL CARE IF:  You have a fever.  You develop a cough that does not stop easily (persistent).  You have shortness of breath.  You start wheezing.  Symptoms interfere with normal daily activities.   This information is not intended to replace advice given to you by your health care provider. Make sure you discuss any questions you have with your health care provider.   Document Released: 07/24/2001 Document Revised: 11/19/2014 Document Reviewed: 07/06/2013 Elsevier Interactive Patient Education 2016 Elsevier Inc[employer]  Sinusitis, Adult Sinusitis is redness, soreness, and inflammation of the paranasal sinuses. Paranasal sinuses are air pockets within the bones of your face. They are located beneath your eyes, in the middle of your forehead, and above your eyes. In healthy paranasal sinuses, mucus is able to drain out, and air is able to circulate through them by way of your nose. However, when your paranasal sinuses are  inflamed, mucus and air can become trapped. This can allow bacteria and other germs to grow and cause infection. Sinusitis can develop quickly and last only a short time (acute)  or continue over a long period (chronic). Sinusitis that lasts for more than 12 weeks is considered chronic. CAUSES Causes of sinusitis include:  Allergies.  Structural abnormalities, such as displacement of the cartilage that separates your nostrils (deviated septum), which can decrease the air flow through your nose and sinuses and affect sinus drainage.  Functional abnormalities, such as when the small hairs (cilia) that line your sinuses and help remove mucus do not work properly or are not present. SIGNS AND SYMPTOMS Symptoms of acute and chronic sinusitis are the same. The primary symptoms are pain and pressure around the affected sinuses. Other symptoms include:  Upper toothache.  Earache.  Headache.  Bad breath.  Decreased sense of smell and taste.  A cough, which worsens when you are lying flat.  Fatigue.  Fever.  Thick drainage from your nose, which often is green and may contain pus (purulent).  Swelling and warmth over the affected sinuses. DIAGNOSIS Your health care provider will perform a physical exam. During your exam, your health care provider may perform any of the following to help determine if you have acute sinusitis or chronic sinusitis:  Look in your nose for signs of abnormal growths in your nostrils (nasal polyps).  Tap over the affected sinus to check for signs of infection.  View the inside of your sinuses using an imaging device that has a light attached (endoscope). If your health care provider suspects that you have chronic sinusitis, one or more of the following tests may be recommended:  Allergy tests.  Nasal culture. A sample of mucus is taken from your nose, sent to a lab, and screened for bacteria.  Nasal cytology. A sample of mucus is taken from your nose and examined by your health care provider to determine if your sinusitis is related to an allergy. TREATMENT Most cases of acute sinusitis are related to a viral infection and will  resolve on their own within 10 days. Sometimes, medicines are prescribed to help relieve symptoms of both acute and chronic sinusitis. These may include pain medicines, decongestants, nasal steroid sprays, or saline sprays. However, for sinusitis related to a bacterial infection, your health care provider will prescribe antibiotic medicines. These are medicines that will help kill the bacteria causing the infection. Rarely, sinusitis is caused by a fungal infection. In these cases, your health care provider will prescribe antifungal medicine. For some cases of chronic sinusitis, surgery is needed. Generally, these are cases in which sinusitis recurs more than 3 times per year, despite other treatments. HOME CARE INSTRUCTIONS  Drink plenty of water. Water helps thin the mucus so your sinuses can drain more easily.  Use a humidifier.  Inhale steam 3-4 times a day (for example, sit in the bathroom with the shower running).  Apply a warm, moist washcloth to your face 3-4 times a day, or as directed by your health care provider.  Use saline nasal sprays to help moisten and clean your sinuses.  Take medicines only as directed by your health care provider.  If you were prescribed either an antibiotic or antifungal medicine, finish it all even if you start to feel better. SEEK IMMEDIATE MEDICAL CARE IF:  You have increasing pain or severe headaches.  You have nausea, vomiting, or drowsiness.  You have swelling around your face.  You have  vision problems.  You have a stiff neck.  You have difficulty breathing.   This information is not intended to replace advice given to you by your health care provider. Make sure you discuss any questions you have with your health care provider.   Document Released: 10/29/2005 Document Revised: 11/19/2014 Document Reviewed: 11/13/2011 Elsevier Interactive Patient Education [employer].

## 2016-03-11 NOTE — ED Notes (Signed)
Patient states that symptoms started around 10 days ago. Patient states that she has been having congestion and runny nose, ear pain and sinus pain and pressure. Patient states that she has been using Flonase and Sudafed without relief. Patient states that she recently went to [place] on an airplane and symptoms worsened after this.

## 2016-03-12 ENCOUNTER — Telehealth: Payer: Self-pay | Admitting: *Deleted

## 2016-03-12 NOTE — Telephone Encounter (Signed)
Received referral for low dose lung cancer screening CT scan. Voicemail left at phone number listed in EMR for patient to call me back to facilitate scheduling scan.  

## 2016-03-28 ENCOUNTER — Telehealth: Payer: Self-pay | Admitting: *Deleted

## 2016-03-28 NOTE — Telephone Encounter (Signed)
Received referral for low dose lung cancer screening CT scan. However, patient does not meet 30 pack year requirement. She reports smoking 1/2 pack per day x 30 years, or 15 pack years.

## 2016-03-28 NOTE — Telephone Encounter (Signed)
Ok. Thank you.

## 2016-06-18 ENCOUNTER — Ambulatory Visit: Payer: 59 | Admitting: Internal Medicine

## 2016-09-04 ENCOUNTER — Ambulatory Visit (INDEPENDENT_AMBULATORY_CARE_PROVIDER_SITE_OTHER): Payer: 59 | Admitting: Internal Medicine

## 2016-09-04 ENCOUNTER — Encounter: Payer: Self-pay | Admitting: Internal Medicine

## 2016-09-04 VITALS — BP 118/84 | HR 73 | Temp 98.1°F | Ht 68.0 in | Wt 190.0 lb

## 2016-09-04 DIAGNOSIS — E7800 Pure hypercholesterolemia, unspecified: Secondary | ICD-10-CM | POA: Diagnosis not present

## 2016-09-04 DIAGNOSIS — I10 Essential (primary) hypertension: Secondary | ICD-10-CM | POA: Diagnosis not present

## 2016-09-04 DIAGNOSIS — E559 Vitamin D deficiency, unspecified: Secondary | ICD-10-CM

## 2016-09-04 DIAGNOSIS — Z720 Tobacco use: Secondary | ICD-10-CM

## 2016-09-04 LAB — COMPREHENSIVE METABOLIC PANEL
ALT: 13 U/L (ref 0–35)
AST: 13 U/L (ref 0–37)
Albumin: 4.3 g/dL (ref 3.5–5.2)
Alkaline Phosphatase: 82 U/L (ref 39–117)
BILIRUBIN TOTAL: 0.7 mg/dL (ref 0.2–1.2)
BUN: 8 mg/dL (ref 6–23)
CO2: 32 mEq/L (ref 19–32)
Calcium: 9.7 mg/dL (ref 8.4–10.5)
Chloride: 105 mEq/L (ref 96–112)
Creatinine, Ser: 0.83 mg/dL (ref 0.40–1.20)
GFR: 74.07 mL/min (ref >60.00)
GLUCOSE: 94 mg/dL (ref 70–99)
Potassium: 4.6 mEq/L (ref 3.5–5.1)
Sodium: 143 mEq/L (ref 135–145)
Total Protein: 6.6 g/dL (ref 6.0–8.3)

## 2016-09-04 LAB — LIPID PANEL
Cholesterol: 200 mg/dL (ref 0–200)
HDL: 33.6 mg/dL — AB (ref >39.00)
NonHDL: 166.7
Total CHOL/HDL Ratio: 6
Triglycerides: 211 mg/dL — ABNORMAL HIGH (ref 0.0–149.0)
VLDL: 42.2 mg/dL — ABNORMAL HIGH (ref 0.0–40.0)

## 2016-09-04 LAB — LDL CHOLESTEROL, DIRECT: Direct LDL: 140 mg/dL

## 2016-09-04 NOTE — Progress Notes (Signed)
Pre visit review using our clinic review tool, if applicable. No additional management support is needed unless otherwise documented below in the visit note. 

## 2016-09-04 NOTE — Progress Notes (Signed)
Patient ID: Carrie Wilson, female   DOB: 1956-10-31, 60 y.o.   MRN: [id number]   Subjective:    Patient ID: Carrie Wilson, female    DOB: Oct 30, 1956, 60 y.o.   MRN: [id number]  HPI  Patient here for a scheduled follow up.  She is doing well.  Feels good.  Tries to stay active.  No chest pain.  No sob.  No acid reflux.  No abdominal pain or cramping.  Bowels stable.  Had issues previously with not being able to hear.  Was on prednisone.  Saw ENT.  Hearing back to baseline. Is s/p tooth extraction.  Overall she feels she is doing well. Still smoking.  Discussed the need to quit.     Past Medical History:  Diagnosis Date  . GERD (gastroesophageal reflux disease)    neg H. pylori  . Hyperlipidemia   . Hypertension   . Vitamin D deficiency    Past Surgical History:  Procedure Laterality Date  . CHOLECYSTECTOMY  1994  . rectal fissure repair  2002  . TUBAL LIGATION  1995   Family History  Problem Relation Age of Onset  . Hypertension Mother   . Heart disease Mother   . Hypercholesterolemia Brother   . Colon cancer Neg Hx   . Breast cancer Neg Hx    Social History   Social History  . Marital status: Married    Spouse name: N/A  . Number of children: 1  . Years of education: N/A   Social History Main Topics  . Smoking status: Current Every Day Smoker    Packs/day: 0.50    Years: 30.00    Types: Cigarettes  . Smokeless tobacco: Never Used  . Alcohol use No  . Drug use: No  . Sexual activity: Not Asked   Other Topics Concern  . None   Social History Narrative  . None    Outpatient Encounter Prescriptions as of 09/04/2016  Medication Sig  . Cholecalciferol (VITAMIN D-3) 1000 UNITS CAPS Take by mouth daily. 1 tab daily  . [DISCONTINUED] doxycycline (VIBRAMYCIN) 100 MG capsule Take 1 capsule (100 mg total) by mouth 2 (two) times daily.  . [DISCONTINUED] predniSONE (DELTASONE) 20 MG tablet Take 2 tablets (40 mg total) by mouth daily. For 3 days   No  facility-administered encounter medications on file as of 09/04/2016.     Review of Systems  Constitutional: Negative for appetite change and unexpected weight change.  HENT: Negative for congestion and sinus pressure.   Respiratory: Negative for cough, chest tightness and shortness of breath.   Cardiovascular: Negative for chest pain, palpitations and leg swelling.  Gastrointestinal: Negative for abdominal pain, diarrhea, nausea and vomiting.  Genitourinary: Negative for difficulty urinating and dysuria.  Musculoskeletal: Negative for back pain and joint swelling.  Skin: Negative for color change and rash.  Neurological: Negative for dizziness and light-headedness.  Psychiatric/Behavioral: Negative for agitation and dysphoric mood.       Objective:    Physical Exam  Constitutional: She appears well-developed and well-nourished. No distress.  HENT:  Nose: Nose normal.  Mouth/Throat: Oropharynx is clear and moist.  Neck: Neck supple. No thyromegaly present.  Cardiovascular: Normal rate and regular rhythm.   Pulmonary/Chest: Breath sounds normal. No respiratory distress. She has no wheezes.  Abdominal: Soft. Bowel sounds are normal. There is no tenderness.  Musculoskeletal: She exhibits no edema or tenderness.  Lymphadenopathy:    She has no cervical adenopathy.  Skin: No rash noted. No erythema.  Psychiatric: She has a normal mood and affect. Her behavior is normal.    BP 118/84   Pulse 73   Temp 98.1 F (36.7 C) (Oral)   Ht 5\' 8"  (1.727 m)   Wt 190 lb (86.2 kg)   LMP 12/08/1997   SpO2 94%   BMI 28.89 kg/m  Wt Readings from Last 3 Encounters:  09/04/16 190 lb (86.2 kg)  03/11/16 182 lb (82.6 kg)  12/19/15 187 lb 8 oz (85 kg)     Lab Results  Component Value Date   WBC 8.2 12/19/2015   HGB 15.1 (H) 12/19/2015   HCT 45.8 12/19/2015   PLT 195.0 12/19/2015   GLUCOSE 94 09/04/2016   CHOL 200 09/04/2016   TRIG 211.0 (H) 09/04/2016   HDL 33.60 (L) 09/04/2016    LDLDIRECT 140.0 09/04/2016   LDLCALC 131 (H) 12/19/2015   ALT 13 09/04/2016   AST 13 09/04/2016   NA 143 09/04/2016   K 4.6 09/04/2016   CL 105 09/04/2016   CREATININE 0.83 09/04/2016   BUN 8 09/04/2016   CO2 32 09/04/2016   TSH 1.41 12/19/2015       Assessment & Plan:   Problem List Items Addressed This Visit    Hypercholesterolemia    Low cholesterol diet and exercise.  Follow lipid panel.        Relevant Orders   Comprehensive metabolic panel (Completed)   Lipid panel (Completed)   Hypertension - Primary    Blood pressure under good control.  Continue same medication regimen.  Follow pressures.  Follow metabolic panel.        Tobacco use    Discussed the need to quit.  She declines.        Vitamin D deficiency    Continue vitamin D supplements.  Follow level.         Other Visit Diagnoses   None.      Dale [place], MD

## 2016-09-09 ENCOUNTER — Encounter: Payer: Self-pay | Admitting: Internal Medicine

## 2016-09-09 NOTE — Assessment & Plan Note (Signed)
Discussed the need to quit.  She declines.  

## 2016-09-09 NOTE — Assessment & Plan Note (Signed)
Low cholesterol diet and exercise.  Follow lipid panel.   

## 2016-09-09 NOTE — Assessment & Plan Note (Signed)
Blood pressure under good control.  Continue same medication regimen.  Follow pressures.  Follow metabolic panel.   

## 2016-09-09 NOTE — Assessment & Plan Note (Signed)
Continue vitamin D supplements.  Follow level.  

## 2016-10-16 ENCOUNTER — Ambulatory Visit
Admission: EM | Admit: 2016-10-16 | Discharge: 2016-10-16 | Disposition: A | Discharge status: Home / Self Care | Payer: 59 | Attending: Family Medicine | Admitting: Family Medicine

## 2016-10-16 DIAGNOSIS — J0101 Acute recurrent maxillary sinusitis: Secondary | ICD-10-CM

## 2016-10-16 MED ORDER — DOXYCYCLINE HYCLATE 100 MG PO CAPS: 100.0000 mg | ORAL_CAPSULE | Freq: Two times a day (BID) | ORAL | 0 refills | Status: DC

## 2016-10-16 NOTE — ED Triage Notes (Signed)
Patient complains of sinus pain and pressure with headaches. Patient states that symptoms started 10 days ago.

## 2016-10-16 NOTE — ED Provider Notes (Signed)
[facility]    CSN: [id number] Arrival date & time: 10/16/16  1751  History   Chief Complaint Chief Complaint  Patient presents with  . Sinus Problem   HPI 60 year old female with history of sinusitis presents with sinus pressure and pain.  Patient reports a 1.5 week history of right maxillary sinus pressure and pain. She initially thought this was dental in origin and saw her dentist. No findings per dentist. She reports associated chills. Questionable fever. She's taken ibuprofen for pain with improvement. No other associated respiratory symptoms. No other complaints or concerns at this time.   Past Medical History:  Diagnosis Date  . GERD (gastroesophageal reflux disease)    neg H. pylori  . Hyperlipidemia   . Hypertension   . Vitamin D deficiency    Patient Active Problem List   Diagnosis Date Noted  . Left hip pain 06/16/2015  . Left knee pain 12/16/2014  . Health care maintenance 12/16/2014  . Tobacco use 06/20/2014  . GERD (gastroesophageal reflux disease) 12/10/2012  . Hypertension 12/10/2012  . Vitamin D deficiency 12/10/2012  . Hypercholesterolemia 12/08/2012   Past Surgical History:  Procedure Laterality Date  . CHOLECYSTECTOMY  1994  . rectal fissure repair  2002  . TUBAL LIGATION  1995   OB History    No data available     Home Medications    Prior to Admission medications   Medication Sig Start Date End Date Taking? Authorizing Provider  Cholecalciferol (VITAMIN D-3) 1000 UNITS CAPS Take by mouth daily. 1 tab daily   Yes Historical Provider, MD  doxycycline (VIBRAMYCIN) 100 MG capsule Take 1 capsule (100 mg total) by mouth 2 (two) times daily. 10/16/16   Coral Spikes, DO    Family History Family History  Problem Relation Age of Onset  . Hypertension Mother   . Heart disease Mother   . Hypercholesterolemia Brother   . Colon cancer Neg Hx   . Breast cancer Neg Hx     Social History Social History  Substance Use Topics  .  Smoking status: Current Every Day Smoker    Packs/day: 0.50    Years: 30.00    Types: Cigarettes  . Smokeless tobacco: Never Used  . Alcohol use No     Allergies   Codeine sulfate and Penicillins   Review of Systems Review of Systems  Constitutional: Positive for chills.  HENT: Positive for sinus pain and sinus pressure. Negative for dental problem.    Physical Exam Triage Vital Signs ED Triage Vitals [10/16/16 1815]  Enc Vitals Group     BP (!) 144/82     Pulse Rate 72     Resp 16     Temp 98.4 F (36.9 C)     Temp Source Oral     SpO2 99 %     Weight 182 lb (82.6 kg)     Height 5\' 8"  (1.727 m)     Head Circumference      Peak Flow      Pain Score 6     Pain Loc      Pain Edu?      Excl. in [place]?    Updated Vital Signs BP (!) 144/82 (BP Location: Left Arm)   Pulse 72   Temp 98.4 F (36.9 C) (Oral)   Resp 16   Ht 5\' 8"  (1.727 m)   Wt 182 lb (82.6 kg)   LMP 12/08/1997   SpO2 99%   BMI 27.67 kg/m  Physical Exam  Constitutional: She is oriented to person, place, and time. She appears well-developed. No distress.  HENT:  Head: Normocephalic and atraumatic.  Severe right maxillary sinus tenderness to palpation and percussion. Oropharynx clear. Poor dentition.  Pulmonary/Chest: Effort normal.  Neurological: She is alert and oriented to person, place, and time.  Psychiatric: She has a normal mood and affect.  Vitals reviewed.  UC Treatments / Results  Labs (all labs ordered are listed, but only abnormal results are displayed) Labs Reviewed - No data to display  EKG  EKG Interpretation None       Radiology No results found.  Procedures Procedures (including critical care time)  Medications Ordered in UC Medications - No data to display  Initial Impression / Assessment and Plan / UC Course  I have reviewed the triage vital signs and the nursing notes.  Pertinent labs & imaging results that were available during my care of the patient were  reviewed by me and considered in my medical decision making (see chart for details).  Clinical Course   60 year old female with a history of sinusitis presents with severe sinus pressure and pain. Suspect sinusitis. Treating empirically with doxycycline.  Final Clinical Impressions(s) / UC Diagnoses   Final diagnoses:  Acute recurrent maxillary sinusitis   New Prescriptions Discharge Medication List as of 10/16/2016  6:49 PM    START taking these medications   Details  doxycycline (VIBRAMYCIN) 100 MG capsule Take 1 capsule (100 mg total) by mouth 2 (two) times daily., Starting Tue 10/16/2016, Normal         Coral Spikes, DO 10/16/16 [id number]

## 2016-10-16 NOTE — Discharge Instructions (Signed)
I am treating your for a sinus infection.  Please follow up with your PCP.  Take care  Dr. Lacinda Axon

## 2016-10-19 ENCOUNTER — Telehealth: Payer: Self-pay | Admitting: *Deleted

## 2016-10-19 ENCOUNTER — Telehealth: Payer: Self-pay

## 2016-10-19 NOTE — Telephone Encounter (Signed)
Call was transferred to billing

## 2016-10-19 NOTE — Telephone Encounter (Signed)
Courtesy call back completed today after patient's visit at [facility]. Patient improved and will call back with any questions or concerns.  

## 2016-10-21 ENCOUNTER — Ambulatory Visit
Admission: EM | Admit: 2016-10-21 | Discharge: 2016-10-21 | Disposition: A | Discharge status: Home / Self Care | Payer: 59 | Attending: Family Medicine | Admitting: Family Medicine

## 2016-10-21 DIAGNOSIS — R0981 Nasal congestion: Secondary | ICD-10-CM

## 2016-10-21 MED ORDER — NAPROXEN 500 MG PO TABS: 500.0000 mg | ORAL_TABLET | Freq: Two times a day (BID) | ORAL | 0 refills | Status: DC

## 2016-10-21 NOTE — Discharge Instructions (Signed)
We discussed that having 5 days left of abx you will need to continue this Take motrin or naproxen to help with discomfort May need to take clartin daily to help with  the drainage May take benadryl at night to help. May use humidifier at night  May use nasal spray

## 2016-10-21 NOTE — ED Provider Notes (Signed)
CSN: [id number]     Arrival date & time 10/21/16  1200 History   First MD Initiated Contact with Patient 10/21/16 1347     Chief Complaint  Patient presents with  . Facial Pain   (Consider location/radiation/quality/duration/timing/severity/associated sxs/prior Treatment) Pt was seen here and tx for sinus infection she states that it is not any better and she still has pressure. She still has 5 days of abx left. Taking motrin once a day for discomfort with minimal relief.       Past Medical History:  Diagnosis Date  . GERD (gastroesophageal reflux disease)    neg H. pylori  . Hyperlipidemia   . Hypertension   . Vitamin D deficiency    Past Surgical History:  Procedure Laterality Date  . CHOLECYSTECTOMY  1994  . rectal fissure repair  2002  . TUBAL LIGATION  1995   Family History  Problem Relation Age of Onset  . Hypertension Mother   . Heart disease Mother   . Hypercholesterolemia Brother   . Colon cancer Neg Hx   . Breast cancer Neg Hx    Social History  Substance Use Topics  . Smoking status: Current Every Day Smoker    Packs/day: 0.50    Years: 30.00    Types: Cigarettes  . Smokeless tobacco: Never Used  . Alcohol use No   OB History    No data available     Review of Systems  Constitutional: Negative.   HENT: Positive for congestion, postnasal drip, rhinorrhea, sinus pain and sinus pressure.   Eyes: Negative.   Respiratory: Negative.   Cardiovascular: Negative.   Skin: Negative.   Neurological: Negative.     Allergies  Codeine sulfate and Penicillins  Home Medications   Prior to Admission medications   Medication Sig Start Date End Date Taking? Authorizing Provider  Cholecalciferol (VITAMIN D-3) 1000 UNITS CAPS Take by mouth daily. 1 tab daily    Historical Provider, MD  doxycycline (VIBRAMYCIN) 100 MG capsule Take 1 capsule (100 mg total) by mouth 2 (two) times daily. 10/16/16   Coral Spikes, DO  naproxen (NAPROSYN) 500 MG tablet Take 1 tablet  (500 mg total) by mouth 2 (two) times daily. 10/21/16   Melanee Left, NP   Meds Ordered and Administered this Visit  Medications - No data to display  BP 135/67 (BP Location: Right Arm)   Pulse 71   Temp 98.2 F (36.8 C) (Oral)   Resp 18   Ht 5\' 8"  (1.727 m)   Wt 182 lb (82.6 kg)   LMP 12/08/1997   SpO2 100%   BMI 27.67 kg/m  No data found.   Physical Exam  Constitutional: She appears well-developed.  HENT:  Head: Normocephalic.  Rt side nasal pressure, rt ear normal.   Eyes: Pupils are equal, round, and reactive to light.  Neck: Normal range of motion.  Cardiovascular: Normal rate and regular rhythm.   Pulmonary/Chest: Effort normal and breath sounds normal.  Neurological: She is alert.  Skin: Skin is warm.    Urgent Care Course   Clinical Course     Procedures (including critical care time)  Labs Review Labs Reviewed - No data to display  Imaging Review         MDM   1. Sinus congestion    We discussed that having 5 days left of abx you will need to continue this Take motrin or naproxen to help with discomfort May need to take clartin daily to help  with  the drainage May take benadryl at night to help. May use humidifier at night  May use nasal spray  Reviewed previous chart    Melanee Left, NP 10/21/16 1400

## 2016-10-21 NOTE — ED Triage Notes (Signed)
Pt seen here on Tuesday and diagnosed with sinusitis and placed on ABX which she is still taking. Pt reports no improvement in facial pain, right eye pain and headache. Pain is worse at night. She sneezed out a hard green glob on Friday but none since. No cough. Pain 5/10

## 2016-10-30 ENCOUNTER — Encounter: Payer: Self-pay | Admitting: Family

## 2016-10-30 ENCOUNTER — Ambulatory Visit (INDEPENDENT_AMBULATORY_CARE_PROVIDER_SITE_OTHER): Payer: 59 | Admitting: Family

## 2016-10-30 VITALS — BP 144/80 | HR 81 | Temp 98.6°F | Ht 68.0 in | Wt 191.6 lb

## 2016-10-30 DIAGNOSIS — H6591 Unspecified nonsuppurative otitis media, right ear: Secondary | ICD-10-CM

## 2016-10-30 MED ORDER — AZITHROMYCIN 250 MG PO TABS: ORAL_TABLET | ORAL | 0 refills | Status: DC

## 2016-10-30 MED ORDER — PREDNISONE 10 MG PO TABS: ORAL_TABLET | ORAL | 0 refills | Status: DC

## 2016-10-30 NOTE — Patient Instructions (Signed)
Trial zpack, prednisone.  Let me know if not better  Barotitis Media Barotitis media is inflammation of your middle ear. This occurs when the auditory tube (eustachian tube) leading from the back of your nose (nasopharynx) to your eardrum is blocked. This blockage may result from a cold, environmental allergies, or an upper respiratory infection. Unresolved barotitis media may lead to damage or hearing loss (barotrauma), which may become permanent. HOME CARE INSTRUCTIONS   Use medicines as recommended by your health care provider. Over-the-counter medicines will help unblock the canal and can help during times of air travel.  Do not put anything into your ears to clean or unplug them. Eardrops will not be helpful.  Do not swim, dive, or fly until your health care provider says it is all right to do so. If these activities are necessary, chewing gum with frequent, forceful swallowing may help. It is also helpful to hold your nose and gently blow to pop your ears for equalizing pressure changes. This forces air into the eustachian tube.  Only take over-the-counter or prescription medicines for pain, discomfort, or fever as directed by your health care provider.  A decongestant may be helpful in decongesting the middle ear and make pressure equalization easier. SEEK MEDICAL CARE IF:  You experience a serious form of dizziness in which you feel as if the room is spinning and you feel nauseated (vertigo).  Your symptoms only involve one ear. SEEK IMMEDIATE MEDICAL CARE IF:   You develop a severe headache, dizziness, or severe ear pain.  You have bloody or pus-like drainage from your ears.  You develop a fever.  Your problems do not improve or become worse. MAKE SURE YOU:   Understand these instructions.  Will watch your condition.  Will get help right away if you are not doing well or get worse. This information is not intended to replace advice given to you by your health care  provider. Make sure you discuss any questions you have with your health care provider. Document Released: 10/26/2000 Document Revised: 08/19/2013 Document Reviewed: 05/26/2013 Elsevier Interactive Patient Education  2017 [employer].

## 2016-10-30 NOTE — Progress Notes (Signed)
Pre visit review using our clinic review tool, if applicable. No additional management support is needed unless otherwise documented below in the visit note. 

## 2016-10-30 NOTE — Progress Notes (Signed)
Subjective:    Patient ID: Carrie Wilson, female    DOB: Mar 22, 1956, 60 y.o.   MRN: [id number]  CC: Carrie Wilson is a 60 y.o. female who presents today for an acute visit.    HPI: CC: sinus pain, sore throat, ear ache right side x 3 weeks, worsening. Endorses HA on right sided pain, some relief with naprosyn. Chills.   Went to dentist yesterday and no teeth abscess.                  no fever, cough, nasal congestion. Started mucinex a couple of days ago, no difference.    12/5 started on doxycycline and then returned to urgent care 12/10 and started on naprosyn  02/2016 treated for sinus infection on doxycycline, prednisone; turned out to have dental infection at that time.  Years ago prednisone taper for ear pain, pressure after flying.       HISTORY:  Past Medical History:  Diagnosis Date  . GERD (gastroesophageal reflux disease)    neg H. pylori  . Hyperlipidemia   . Hypertension   . Vitamin D deficiency    Past Surgical History:  Procedure Laterality Date  . CHOLECYSTECTOMY  1994  . rectal fissure repair  2002  . TUBAL LIGATION  1995   Family History  Problem Relation Age of Onset  . Hypertension Mother   . Heart disease Mother   . Hypercholesterolemia Brother   . Colon cancer Neg Hx   . Breast cancer Neg Hx     Allergies: Codeine sulfate and Penicillins No current outpatient prescriptions on file prior to visit.   No current facility-administered medications on file prior to visit.     Social History  Substance Use Topics  . Smoking status: Current Every Day Smoker    Packs/day: 0.50    Years: 30.00    Types: Cigarettes  . Smokeless tobacco: Never Used  . Alcohol use No    Review of Systems  Constitutional: Negative for chills and fever.  HENT: Positive for ear pain, sinus pressure and sore throat. Negative for congestion and dental problem.   Eyes: Negative for visual disturbance.  Respiratory: Negative for cough, shortness of  breath and wheezing.   Cardiovascular: Negative for chest pain and palpitations.  Gastrointestinal: Negative for nausea and vomiting.  Neurological: Positive for headaches. Negative for dizziness.      Objective:    BP (!) 144/80   Pulse 81   Temp 98.6 F (37 C) (Oral)   Ht 5\' 8"  (1.727 m)   Wt 191 lb 9.6 oz (86.9 kg)   LMP 12/08/1997   SpO2 96%   BMI 29.13 kg/m    Physical Exam  Constitutional: She appears well-developed and well-nourished.  HENT:  Head: Normocephalic and atraumatic.  Right Ear: Hearing, external ear and ear canal normal. No swelling or tenderness. Tympanic membrane is not erythematous and not bulging. A middle ear effusion is present.  Left Ear: Tympanic membrane, external ear and ear canal normal. No swelling or tenderness. Tympanic membrane is not erythematous and not bulging.  No middle ear effusion.  Nose: Nose normal. No rhinorrhea. Right sinus exhibits no maxillary sinus tenderness and no frontal sinus tenderness. Left sinus exhibits no maxillary sinus tenderness and no frontal sinus tenderness.  Mouth/Throat: Uvula is midline, oropharynx is clear and moist and mucous membranes are normal. No oral lesions. No dental abscesses. No posterior oropharyngeal edema or posterior oropharyngeal erythema.  Poor dentition. No tenderness with palpation  of oral mucosa  Eyes: Conjunctivae, EOM and lids are normal. Pupils are equal, round, and reactive to light. Lids are everted and swept, no foreign bodies found.  Normal fundus bilaterally   Cardiovascular: Normal rate, regular rhythm, normal heart sounds and normal pulses.   Pulmonary/Chest: Effort normal and breath sounds normal. She has no wheezes. She has no rhonchi. She has no rales.  Lymphadenopathy:       Head (right side): No submental, no submandibular, no tonsillar, no preauricular, no posterior auricular and no occipital adenopathy present.       Head (left side): No submental, no submandibular, no tonsillar,  no preauricular, no posterior auricular and no occipital adenopathy present.    She has no cervical adenopathy.       Right cervical: No superficial cervical, no deep cervical and no posterior cervical adenopathy present.      Left cervical: No superficial cervical, no deep cervical and no posterior cervical adenopathy present.  Neurological: She is alert. She has normal strength. No cranial nerve deficit or sensory deficit. She displays a negative Romberg sign.  Reflex Scores:      Bicep reflexes are 2+ on the right side and 2+ on the left side.      Patellar reflexes are 2+ on the right side and 2+ on the left side. Grip equal and strong bilateral upper extremities. Gait strong and steady. Able to perform  finger-to-nose without difficulty.   Skin: Skin is warm and dry.  Psychiatric: She has a normal mood and affect. Her speech is normal and behavior is normal. Thought content normal.  Vitals reviewed.      Assessment & Plan:   1. Right non-suppurative otitis media Worsening. Due to duration of symptoms and new ear pain, patient and I jointly decided to start antibiotic. She did not have improvement on doxycycline, appropriate to trial azithromycin and she is PCN allergic. Prednisone taper for concurrent sinus tenderness, headache. Discussed referral to ENT if this treatment regimen does not yield results. Patient will let us know if she is doing.  - azithromycin (ZITHROMAX) 250 MG tablet; Tale 500 mg PO on day 1, then 250 mg PO q24h x 4 days.  Dispense: 6 tablet; Refill: 0 - predniSONE (DELTASONE) 10 MG tablet; Take 40 mg by mouth on day 1, then taper 10 mg daily until gone  Dispense: 10 tablet; Refill: 0    I have discontinued Ms. Hershey's Vitamin D-3, doxycycline, and naproxen.   No orders of the defined types were placed in this encounter.   Return precautions given.   Risks, benefits, and alternatives of the medications and treatment plan prescribed today were discussed, and  patient expressed understanding.   Education regarding symptom management and diagnosis given to patient on AVS.  Continue to follow with [place], MD for routine health maintenance.   Carrie Wilson and I agreed with plan.   Rennie Plowman, FNP

## 2016-11-01 ENCOUNTER — Telehealth: Payer: Self-pay | Admitting: Internal Medicine

## 2016-11-01 DIAGNOSIS — H6591 Unspecified nonsuppurative otitis media, right ear: Secondary | ICD-10-CM

## 2016-11-01 MED ORDER — PREDNISONE 10 MG PO TABS: ORAL_TABLET | ORAL | 0 refills | Status: DC

## 2016-11-01 NOTE — Telephone Encounter (Signed)
Pt called and stated that M. Arnett wanted to speak with her in regards to medication . Pt stated that she is feeling better but it is not completely gone. She asked if M. Arnett could get a refill of the prednisone. Please advise, thank you!  [place], [place] - [place]  Call pt @ [phone]

## 2016-11-01 NOTE — Telephone Encounter (Signed)
Call pt  Refilled  Thanks!

## 2016-11-01 NOTE — Telephone Encounter (Signed)
Please advise 

## 2016-11-02 NOTE — Progress Notes (Signed)
Left message for patient to return call back.  

## 2016-11-02 NOTE — Progress Notes (Signed)
Patient stated that she is feeling better and the medication are working.

## 2016-11-15 DIAGNOSIS — J019 Acute sinusitis, unspecified: Secondary | ICD-10-CM | POA: Diagnosis not present

## 2016-11-15 DIAGNOSIS — H9201 Otalgia, right ear: Secondary | ICD-10-CM | POA: Diagnosis not present

## 2016-12-06 DIAGNOSIS — J322 Chronic ethmoidal sinusitis: Secondary | ICD-10-CM | POA: Diagnosis not present

## 2016-12-06 DIAGNOSIS — J320 Chronic maxillary sinusitis: Secondary | ICD-10-CM | POA: Diagnosis not present

## 2016-12-27 DIAGNOSIS — J329 Chronic sinusitis, unspecified: Secondary | ICD-10-CM | POA: Diagnosis not present

## 2017-03-05 ENCOUNTER — Other Ambulatory Visit (HOSPITAL_COMMUNITY)
Admission: RE | Admit: 2017-03-05 | Discharge: 2017-03-05 | Disposition: A | Discharge status: Home / Self Care | Payer: 59 | Source: Ambulatory Visit | Attending: Internal Medicine | Admitting: Internal Medicine

## 2017-03-05 ENCOUNTER — Ambulatory Visit (INDEPENDENT_AMBULATORY_CARE_PROVIDER_SITE_OTHER): Payer: 59 | Admitting: Internal Medicine

## 2017-03-05 ENCOUNTER — Encounter: Payer: Self-pay | Admitting: Internal Medicine

## 2017-03-05 VITALS — BP 128/84 | HR 74 | Temp 98.2°F | Resp 12 | Ht 67.32 in | Wt 194.0 lb

## 2017-03-05 DIAGNOSIS — I10 Essential (primary) hypertension: Secondary | ICD-10-CM | POA: Diagnosis not present

## 2017-03-05 DIAGNOSIS — Z720 Tobacco use: Secondary | ICD-10-CM | POA: Diagnosis not present

## 2017-03-05 DIAGNOSIS — E7800 Pure hypercholesterolemia, unspecified: Secondary | ICD-10-CM

## 2017-03-05 DIAGNOSIS — Z0000 Encounter for general adult medical examination without abnormal findings: Secondary | ICD-10-CM | POA: Diagnosis not present

## 2017-03-05 DIAGNOSIS — Z1239 Encounter for other screening for malignant neoplasm of breast: Secondary | ICD-10-CM

## 2017-03-05 DIAGNOSIS — Z124 Encounter for screening for malignant neoplasm of cervix: Secondary | ICD-10-CM | POA: Insufficient documentation

## 2017-03-05 DIAGNOSIS — J329 Chronic sinusitis, unspecified: Secondary | ICD-10-CM | POA: Diagnosis not present

## 2017-03-05 DIAGNOSIS — E559 Vitamin D deficiency, unspecified: Secondary | ICD-10-CM

## 2017-03-05 DIAGNOSIS — Z01419 Encounter for gynecological examination (general) (routine) without abnormal findings: Secondary | ICD-10-CM | POA: Diagnosis not present

## 2017-03-05 DIAGNOSIS — Z1231 Encounter for screening mammogram for malignant neoplasm of breast: Secondary | ICD-10-CM | POA: Diagnosis not present

## 2017-03-05 LAB — COMPREHENSIVE METABOLIC PANEL
ALK PHOS: 80 U/L (ref 39–117)
ALT: 10 U/L (ref 0–35)
AST: 12 U/L (ref 0–37)
Albumin: 4.2 g/dL (ref 3.5–5.2)
BUN: 8 mg/dL (ref 6–23)
CO2: 31 meq/L (ref 19–32)
Calcium: 9.3 mg/dL (ref 8.4–10.5)
Chloride: 107 mEq/L (ref 96–112)
Creatinine, Ser: 0.83 mg/dL (ref 0.40–1.20)
GFR: 73.95 mL/min (ref >60.00)
GLUCOSE: 104 mg/dL — AB (ref 70–99)
POTASSIUM: 4.1 meq/L (ref 3.5–5.1)
SODIUM: 143 meq/L (ref 135–145)
Total Bilirubin: 0.6 mg/dL (ref 0.2–1.2)
Total Protein: 6.4 g/dL (ref 6.0–8.3)

## 2017-03-05 LAB — LIPID PANEL
CHOL/HDL RATIO: 5
CHOLESTEROL: 198 mg/dL (ref 0–200)
HDL: 41.8 mg/dL (ref >39.00)
LDL Cholesterol: 124 mg/dL — ABNORMAL HIGH (ref 0–99)
NONHDL: 156.15
Triglycerides: 159 mg/dL — ABNORMAL HIGH (ref 0.0–149.0)
VLDL: 31.8 mg/dL (ref 0.0–40.0)

## 2017-03-05 LAB — CBC WITH DIFFERENTIAL/PLATELET
Basophils Absolute: 0.1 10*3/uL (ref 0.0–0.1)
Basophils Relative: 0.7 % (ref 0.0–3.0)
EOS PCT: 1 % (ref 0.0–5.0)
Eosinophils Absolute: 0.1 10*3/uL (ref 0.0–0.7)
HEMATOCRIT: 45.3 % (ref 36.0–46.0)
Hemoglobin: 15.1 g/dL — ABNORMAL HIGH (ref 12.0–15.0)
LYMPHS ABS: 1.9 10*3/uL (ref 0.7–4.0)
Lymphocytes Relative: 23.7 % (ref 12.0–46.0)
MCHC: 33.4 g/dL (ref 30.0–36.0)
MCV: 93.4 fl (ref 78.0–100.0)
MONOS PCT: 6.9 % (ref 3.0–12.0)
Monocytes Absolute: 0.6 10*3/uL (ref 0.1–1.0)
NEUTROS PCT: 67.7 % (ref 43.0–77.0)
Neutro Abs: 5.4 10*3/uL (ref 1.4–7.7)
Platelets: 231 10*3/uL (ref 150.0–400.0)
RBC: 4.85 Mil/uL (ref 3.87–5.11)
RDW: 13.5 % (ref 11.5–15.5)
WBC: 8 10*3/uL (ref 4.0–10.5)

## 2017-03-05 LAB — TSH: TSH: 1.37 u[IU]/mL (ref 0.35–4.50)

## 2017-03-05 LAB — VITAMIN D 25 HYDROXY (VIT D DEFICIENCY, FRACTURES): VITD: 24.94 ng/mL — ABNORMAL LOW (ref 30.00–100.00)

## 2017-03-05 NOTE — Progress Notes (Signed)
Pre-visit discussion using our clinic review tool. No additional management support is needed unless otherwise documented below in the visit note.  

## 2017-03-05 NOTE — Assessment & Plan Note (Addendum)
Physical today 03/05/17.  PAP 03/05/17.  Scheduled for mammogram.  Colonoscopy 04/05/10.  Recommended f/u in five years.  Overdue f/u colonoscopy.

## 2017-03-05 NOTE — Assessment & Plan Note (Signed)
Low cholesterol diet and exercise.  Follow lipid panel.   

## 2017-03-05 NOTE — Progress Notes (Signed)
Patient ID: Carrie Wilson, female   DOB: 1956/01/05, 61 y.o.   MRN: [id number]   Subjective:    Patient ID: Carrie Wilson, female    DOB: [birth year], 61 y.o.   MRN: [id number]  HPI  Patient here for her physical exam.  She reports she is doing well.  Tries to stay active.  No chest pain.  Breathing stable.  Continues to smoke.  Discussed the need to quit.  She declines to stop smoking.  No chest pain.  No acid reflux.  No abdominal pain.  Bowels moving.  She has had problems with her ears.  Discussed f/u with ENT.     Past Medical History:  Diagnosis Date  . GERD (gastroesophageal reflux disease)    neg H. pylori  . Hyperlipidemia   . Hypertension   . Vitamin D deficiency    Past Surgical History:  Procedure Laterality Date  . CHOLECYSTECTOMY  1994  . rectal fissure repair  2002  . TUBAL LIGATION  1995   Family History  Problem Relation Age of Onset  . Hypertension Mother   . Heart disease Mother   . Hypercholesterolemia Brother   . Colon cancer Neg Hx   . Breast cancer Neg Hx    Social History   Social History  . Marital status: Married    Spouse name: N/A  . Number of children: 1  . Years of education: N/A   Social History Main Topics  . Smoking status: Current Every Day Smoker    Packs/day: 0.50    Years: 30.00    Types: Cigarettes  . Smokeless tobacco: Never Used  . Alcohol use No  . Drug use: No  . Sexual activity: Not Asked   Other Topics Concern  . None   Social History Narrative  . None    Outpatient Encounter Prescriptions as of 03/05/2017  Medication Sig  . Calcium Carb-Cholecalciferol (CALCIUM-VITAMIN D) 500-200 MG-UNIT tablet Take 1 tablet by mouth daily.  . [DISCONTINUED] azithromycin (ZITHROMAX) 250 MG tablet Tale 500 mg PO on day 1, then 250 mg PO q24h x 4 days.  . [DISCONTINUED] predniSONE (DELTASONE) 10 MG tablet Take 40 mg by mouth on day 1, then taper 10 mg daily until gone   No facility-administered encounter  medications on file as of 03/05/2017.     Review of Systems  Constitutional: Negative for appetite change and unexpected weight change.  HENT: Negative for sinus pressure and sore throat.   Eyes: Negative for pain and visual disturbance.  Respiratory: Negative for cough, chest tightness and shortness of breath.   Cardiovascular: Negative for chest pain, palpitations and leg swelling.  Gastrointestinal: Negative for abdominal pain, diarrhea, nausea and vomiting.  Genitourinary: Negative for difficulty urinating and dysuria.  Musculoskeletal: Negative for back pain and joint swelling.  Skin: Negative for color change and rash.  Neurological: Negative for dizziness, light-headedness and headaches.  Hematological: Negative for adenopathy. Does not bruise/bleed easily.  Psychiatric/Behavioral: Negative for agitation and dysphoric mood.       Objective:     Blood pressure rechecked by me;  128/84  Physical Exam  Constitutional: She is oriented to person, place, and time. She appears well-developed and well-nourished. No distress.  HENT:  Nose: Nose normal.  Mouth/Throat: Oropharynx is clear and moist.  Eyes: Right eye exhibits no discharge. Left eye exhibits no discharge. No scleral icterus.  Neck: Neck supple. No thyromegaly present.  Cardiovascular: Normal rate and regular rhythm.   Pulmonary/Chest: Breath sounds normal.  No accessory muscle usage. No tachypnea. No respiratory distress. She has no decreased breath sounds. She has no wheezes. She has no rhonchi. Right breast exhibits no inverted nipple, no mass, no nipple discharge and no tenderness (no axillary adenopathy). Left breast exhibits no inverted nipple, no mass, no nipple discharge and no tenderness (no axilarry adenopathy).  Abdominal: Soft. Bowel sounds are normal. There is no tenderness.  Genitourinary:  Genitourinary Comments: Normal external genitalia.  Vaginal vault without lesions.  Cervix identified.  Pap smear  performed.  Could not appreciate any adnexal masses or tenderness.    Musculoskeletal: She exhibits no edema or tenderness.  Lymphadenopathy:    She has no cervical adenopathy.  Neurological: She is alert and oriented to person, place, and time.  Skin: Skin is warm. No rash noted. No erythema.  Psychiatric: She has a normal mood and affect. Her behavior is normal.    BP 128/84   Pulse 74   Temp 98.2 F (36.8 C) (Oral)   Resp 12   Ht 5' 7.32" (1.71 m)   Wt 194 lb (88 kg)   LMP 12/08/1997   SpO2 97%   BMI 30.09 kg/m  Wt Readings from Last 3 Encounters:  10/30/16 191 lb 9.6 oz (86.9 kg)  10/21/16 182 lb (82.6 kg)  10/16/16 182 lb (82.6 kg)     Lab Results  Component Value Date   WBC 8.0 03/05/2017   HGB 15.1 (H) 03/05/2017   HCT 45.3 03/05/2017   PLT 231.0 03/05/2017   GLUCOSE 104 (H) 03/05/2017   CHOL 198 03/05/2017   TRIG 159.0 (H) 03/05/2017   HDL 41.80 03/05/2017   LDLDIRECT 140.0 09/04/2016   LDLCALC 124 (H) 03/05/2017   ALT 10 03/05/2017   AST 12 03/05/2017   NA 143 03/05/2017   K 4.1 03/05/2017   CL 107 03/05/2017   CREATININE 0.83 03/05/2017   BUN 8 03/05/2017   CO2 31 03/05/2017   TSH 1.37 03/05/2017       Assessment & Plan:   Problem List Items Addressed This Visit    Health care maintenance    Physical today 03/05/17.  PAP 03/05/17.  Scheduled for mammogram.  Colonoscopy 04/05/10.  Recommended f/u in five years.  Overdue f/u colonoscopy.        Hypercholesterolemia    Low cholesterol diet and exercise.  Follow lipid panel.        Relevant Orders   TSH (Completed)   Lipid panel (Completed)   Comprehensive metabolic panel (Completed)   CBC with Differential/Platelet (Completed)   Hypertension    Blood pressure on recheck improved.  On no medication.  Have her continue to spot check.  She gets her daughter (who is a Engineer, civil (consulting)) to check.  Follow.        Tobacco use    Discussed the need to quit smoking.  She declines to stop.  Follow.         Vitamin D deficiency    Vitamin D supplements.  Recheck vitamin D level.        Relevant Orders   VITAMIN D 25 Hydroxy (Vit-D Deficiency, Fractures) (Completed)    Other Visit Diagnoses    Screening for breast cancer    -  Primary   Relevant Orders   MM DIGITAL SCREENING BILATERAL   Screening for cervical cancer       Relevant Orders   Cytology - PAP (Completed)   Recurrent sinusitis       has had problems with  frequent sinus and ear issues.  discussed ENT evaluation.  she has seen Dr Willeen Cass.  she will contact.         Dale [place], MD

## 2017-03-05 NOTE — Assessment & Plan Note (Signed)
Vitamin D supplements.  Recheck vitamin D level.

## 2017-03-06 ENCOUNTER — Telehealth: Payer: Self-pay

## 2017-03-06 NOTE — Telephone Encounter (Signed)
-----   Message from Einar Pheasant, MD sent at 03/06/2017  5:38 AM EDT ----- Notify pt that her vitamin D level is slightly decreased.  Per note taking calcium with vitamin D.  Need to add vitamin D3 1000 units per day.  Triglycerides and cholesterol levels have improved some.  Given persistent increase and other risk factors, would like to start cholesterol medication.  Start crestor 10mg  q day.  Will need liver panel checked 6 weeks after starting the cholesterol medication.  Thyroid test, kidney function tests and liver function tests are wnl.

## 2017-03-06 NOTE — Telephone Encounter (Signed)
Left message to return call to our office.  

## 2017-03-07 LAB — CYTOLOGY - PAP
Diagnosis: NEGATIVE
HPV: NOT DETECTED

## 2017-03-07 NOTE — Telephone Encounter (Signed)
Patient informed no questions  

## 2017-03-07 NOTE — Telephone Encounter (Signed)
Continue same vitamin D as she is doing.

## 2017-03-07 NOTE — Telephone Encounter (Signed)
Spoke to patient she did not want to start medication at this time she will continue to work on diet and exercise. I have mailed her some information on diet and copy of labs. Patient states she is not taking the calcium with vitamin d. She is taking D3 2000u a day. Informed I would call back with instructions on if she should increase/change that in any way.

## 2017-03-11 ENCOUNTER — Encounter: Payer: Self-pay | Admitting: Internal Medicine

## 2017-03-11 NOTE — Assessment & Plan Note (Signed)
Blood pressure on recheck improved.  On no medication.  Have her continue to spot check.  She gets her daughter (who is a Marine scientist) to check.  Follow.

## 2017-03-11 NOTE — Assessment & Plan Note (Signed)
Discussed the need to quit smoking.  She declines to stop.  Follow.  

## 2017-03-27 ENCOUNTER — Ambulatory Visit
Admission: RE | Admit: 2017-03-27 | Discharge: 2017-03-27 | Disposition: A | Discharge status: Home / Self Care | Payer: 59 | Source: Ambulatory Visit | Attending: Internal Medicine | Admitting: Internal Medicine

## 2017-03-27 DIAGNOSIS — Z1231 Encounter for screening mammogram for malignant neoplasm of breast: Secondary | ICD-10-CM | POA: Diagnosis not present

## 2017-03-27 DIAGNOSIS — Z1239 Encounter for other screening for malignant neoplasm of breast: Secondary | ICD-10-CM

## 2017-06-03 ENCOUNTER — Ambulatory Visit
Admission: EM | Admit: 2017-06-03 | Discharge: 2017-06-03 | Disposition: A | Discharge status: Home / Self Care | Payer: 59 | Attending: Internal Medicine | Admitting: Internal Medicine

## 2017-06-03 ENCOUNTER — Encounter: Payer: Self-pay | Admitting: Emergency Medicine

## 2017-06-03 DIAGNOSIS — R059 Cough, unspecified: Secondary | ICD-10-CM

## 2017-06-03 DIAGNOSIS — R05 Cough: Secondary | ICD-10-CM | POA: Diagnosis not present

## 2017-06-03 DIAGNOSIS — J029 Acute pharyngitis, unspecified: Secondary | ICD-10-CM | POA: Diagnosis not present

## 2017-06-03 DIAGNOSIS — J209 Acute bronchitis, unspecified: Secondary | ICD-10-CM | POA: Diagnosis not present

## 2017-06-03 MED ORDER — PREDNISONE 50 MG PO TABS: 50.0000 mg | ORAL_TABLET | Freq: Every day | ORAL | 0 refills | Status: DC

## 2017-06-03 NOTE — ED Triage Notes (Signed)
Patient c/o cough and chest congestion since last Monday.

## 2017-06-03 NOTE — ED Provider Notes (Signed)
[facility]    CSN: [id number] Arrival date & time: 06/03/17  1532     History   Chief Complaint Chief Complaint  Patient presents with  . Cough    HPI Carrie Wilson is a 61 y.o. female. She presents today with one-week history of respiratory symptoms, started with a sore throat a week ago. She had some head congestion and runny nose which have subsided, and now has chest tightness and cough. Concerned about the rattling at bedtime in her chest. Over-the-counter DM cough syrup is helping a lot with the cough. No nausea/vomiting, no diarrhea, no fever. Has not missed work. Does have some fatigue, progressive had similar symptoms.Marland Kitchen    HPI  Past Medical History:  Diagnosis Date  . GERD (gastroesophageal reflux disease)    neg H. pylori  . Hyperlipidemia   . Hypertension   . Vitamin D deficiency     Patient Active Problem List   Diagnosis Date Noted  . Left hip pain 06/16/2015  . Left knee pain 12/16/2014  . Health care maintenance 12/16/2014  . Tobacco use 06/20/2014  . GERD (gastroesophageal reflux disease) 12/10/2012  . Hypertension 12/10/2012  . Vitamin D deficiency 12/10/2012  . Hypercholesterolemia 12/08/2012    Past Surgical History:  Procedure Laterality Date  . CHOLECYSTECTOMY  1994  . rectal fissure repair  2002  . TUBAL LIGATION  1995     Home Medications    Prior to Admission medications   Medication Sig Start Date End Date Taking? Authorizing Provider  Calcium Carb-Cholecalciferol (CALCIUM-VITAMIN D) 500-200 MG-UNIT tablet Take 1 tablet by mouth daily.    [provider]  predniSONE (DELTASONE) 50 MG tablet Take 1 tablet (50 mg total) by mouth daily. 06/03/17   Sherlene Shams, MD    Family History Family History  Problem Relation Age of Onset  . Hypertension Mother   . Heart disease Mother   . Hypercholesterolemia Brother   . Colon cancer Neg Hx   . Breast cancer Neg Hx     Social History Social History    Substance Use Topics  . Smoking status: Current Every Day Smoker    Packs/day: 0.50    Years: 30.00    Types: Cigarettes  . Smokeless tobacco: Never Used  . Alcohol use No     Allergies   Codeine sulfate and Penicillins   Review of Systems Review of Systems  All other systems reviewed and are negative.    Physical Exam Triage Vital Signs ED Triage Vitals  Enc Vitals Group     BP 06/03/17 1546 130/73     Pulse Rate 06/03/17 1546 87     Resp 06/03/17 1546 16     Temp 06/03/17 1546 98.1 F (36.7 C)     Temp Source 06/03/17 1546 Oral     SpO2 06/03/17 1546 96 %     Weight 06/03/17 1545 186 lb (84.4 kg)     Height 06/03/17 1545 5\' 8"  (1.727 m)     Pain Score 06/03/17 1545 0     Pain Loc --    Updated Vital Signs BP 130/73 (BP Location: Left Arm)   Pulse 87   Temp 98.1 F (36.7 C) (Oral)   Resp 16   Ht 5\' 8"  (1.727 m)   Wt 186 lb (84.4 kg)   LMP 12/08/1997   SpO2 96%   BMI 28.28 kg/m   Physical Exam  Constitutional: She is oriented to person, place, and time. No distress.  HENT:  Head: Atraumatic.  Bilateral ears are waxy, mild to moderate nasal congestion. Posterior pharynx is a little erythematous.  Eyes:  Conjugate gaze observed, no eye redness/discharge  Neck: Neck supple.  Cardiovascular: Normal rate and regular rhythm.   Pulmonary/Chest: No respiratory distress. She has no rales.  Diffuse wheezing, with good air movement. Symmetric breath sounds Frequent wheezy cough during exam  Abdominal: She exhibits no distension.  Musculoskeletal: Normal range of motion.  Neurological: She is alert and oriented to person, place, and time.  Skin: Skin is warm and dry.  Nursing note and vitals reviewed.    UC Treatments / Results   Procedures Procedures (including critical care time) None today  Final Clinical Impressions(s) / UC Diagnoses   Final diagnoses:  Acute bronchitis, unspecified organism  Cough   Prescription for prednisone sent to the  pharmacy.  No danger signs on exam today.  Anticipate gradual improvement in cough and chest rattling over the next several days.  Continue with DM cough syrup.  Recheck for new fever >100.5, increasing phlegm production/nasal discharge, or if not starting to improve in a few days.    New Prescriptions New Prescriptions   PREDNISONE (DELTASONE) 50 MG TABLET    Take 1 tablet (50 mg total) by mouth daily.     Sherlene Shams, MD 06/03/17 [phone]

## 2017-06-03 NOTE — Discharge Instructions (Addendum)
Prescription for prednisone sent to the pharmacy.  No danger signs on exam today.  Anticipate gradual improvement in cough and chest rattling over the next several days.  Continue with DM cough syrup.  Recheck for new fever >100.5, increasing phlegm production/nasal discharge, or if not starting to improve in a few days.

## 2017-09-04 ENCOUNTER — Ambulatory Visit: Payer: 59 | Admitting: Internal Medicine

## 2017-09-08 ENCOUNTER — Encounter: Payer: Self-pay | Admitting: Emergency Medicine

## 2017-09-08 ENCOUNTER — Ambulatory Visit
Admission: EM | Admit: 2017-09-08 | Discharge: 2017-09-08 | Disposition: A | Discharge status: Home / Self Care | Payer: 59 | Attending: Family Medicine | Admitting: Family Medicine

## 2017-09-08 DIAGNOSIS — J411 Mucopurulent chronic bronchitis: Secondary | ICD-10-CM

## 2017-09-08 DIAGNOSIS — J9801 Acute bronchospasm: Secondary | ICD-10-CM

## 2017-09-08 DIAGNOSIS — J0100 Acute maxillary sinusitis, unspecified: Secondary | ICD-10-CM | POA: Diagnosis not present

## 2017-09-08 MED ORDER — ALBUTEROL SULFATE HFA 108 (90 BASE) MCG/ACT IN AERS: 1.0000 | INHALATION_SPRAY | Freq: Four times a day (QID) | RESPIRATORY_TRACT | 0 refills | Status: DC | PRN

## 2017-09-08 MED ORDER — PREDNISONE 20 MG PO TABS: 20.0000 mg | ORAL_TABLET | Freq: Every day | ORAL | 0 refills | Status: DC

## 2017-09-08 MED ORDER — DOXYCYCLINE HYCLATE 100 MG PO TABS: 100.0000 mg | ORAL_TABLET | Freq: Two times a day (BID) | ORAL | 0 refills | Status: DC

## 2017-09-08 NOTE — ED Provider Notes (Signed)
[facility]    CSN: [id number] Arrival date & time: 09/08/17  1059     History   Chief Complaint Chief Complaint  Patient presents with  . Sinusitis    HPI Carrie Wilson is a 61 y.o. female.   The history is provided by the patient.  Sinusitis  Associated symptoms: congestion and fatigue   Associated symptoms: no wheezing   URI  Presenting symptoms: congestion, facial pain and fatigue   Severity:  Moderate Onset quality:  Sudden Timing:  Constant Progression:  Worsening Chronicity:  New Relieved by:  Nothing Worsened by:  Nothing Associated symptoms: sinus pain   Associated symptoms: no wheezing   Risk factors: sick contacts   Risk factors: not elderly and no chronic cardiac disease     Past Medical History:  Diagnosis Date  . GERD (gastroesophageal reflux disease)    neg H. pylori  . Hyperlipidemia   . Hypertension   . Vitamin D deficiency     Patient Active Problem List   Diagnosis Date Noted  . Left hip pain 06/16/2015  . Left knee pain 12/16/2014  . Health care maintenance 12/16/2014  . Tobacco use 06/20/2014  . GERD (gastroesophageal reflux disease) 12/10/2012  . Hypertension 12/10/2012  . Vitamin D deficiency 12/10/2012  . Hypercholesterolemia 12/08/2012    Past Surgical History:  Procedure Laterality Date  . CHOLECYSTECTOMY  1994  . rectal fissure repair  2002  . TUBAL LIGATION  1995    OB History    No data available       Home Medications    Prior to Admission medications   Medication Sig Start Date End Date Taking? Authorizing Provider  cholecalciferol (VITAMIN D) 1000 units tablet Take 1,000 Units by mouth daily.   Yes [provider]  albuterol (PROVENTIL HFA;VENTOLIN HFA) 108 (90 Base) MCG/ACT inhaler Inhale 1-2 puffs into the lungs every 6 (six) hours as needed for wheezing or shortness of breath. 09/08/17   Norval Gable, MD  Calcium Carb-Cholecalciferol (CALCIUM-VITAMIN D) 500-200 MG-UNIT  tablet Take 1 tablet by mouth daily.    [provider]  doxycycline (VIBRA-TABS) 100 MG tablet Take 1 tablet (100 mg total) by mouth 2 (two) times daily. 09/08/17   Norval Gable, MD  predniSONE (DELTASONE) 20 MG tablet Take 1 tablet (20 mg total) by mouth daily. 09/08/17   Norval Gable, MD    Family History Family History  Problem Relation Age of Onset  . Hypertension Mother   . Heart disease Mother   . Hypercholesterolemia Brother   . Colon cancer Neg Hx   . Breast cancer Neg Hx     Social History Social History  Substance Use Topics  . Smoking status: Current Every Day Smoker    Packs/day: 0.50    Years: 30.00    Types: Cigarettes  . Smokeless tobacco: Never Used  . Alcohol use No     Allergies   Codeine sulfate and Penicillins   Review of Systems Review of Systems  Constitutional: Positive for fatigue.  HENT: Positive for congestion and sinus pain.   Respiratory: Negative for wheezing.      Physical Exam Triage Vital Signs ED Triage Vitals  Enc Vitals Group     BP 09/08/17 1130 127/72     Pulse Rate 09/08/17 1130 80     Resp 09/08/17 1130 18     Temp 09/08/17 1130 98.2 F (36.8 C)     Temp Source 09/08/17 1130 Oral  SpO2 09/08/17 1130 94 %     Weight 09/08/17 1131 192 lb (87.1 kg)     Height 09/08/17 1131 5\' 8"  (1.727 m)     Head Circumference --      Peak Flow --      Pain Score 09/08/17 1132 7     Pain Loc --      Pain Edu? --      Excl. in [place]? --    No data found.   Updated Vital Signs BP 127/72 (BP Location: Left Arm)   Pulse 80   Temp 98.2 F (36.8 C) (Oral)   Resp 18   Ht 5\' 8"  (1.727 m)   Wt 192 lb (87.1 kg)   LMP 12/08/1997   SpO2 94%   BMI 29.19 kg/m   Visual Acuity Right Eye Distance:   Left Eye Distance:   Bilateral Distance:    Right Eye Near:   Left Eye Near:    Bilateral Near:     Physical Exam  Constitutional: She appears well-developed and well-nourished. No distress.  HENT:  Head: Normocephalic  and atraumatic.  Right Ear: Tympanic membrane, external ear and ear canal normal.  Left Ear: Tympanic membrane, external ear and ear canal normal.  Nose: Mucosal edema and rhinorrhea present. No nose lacerations, sinus tenderness, nasal deformity, septal deviation or nasal septal hematoma. No epistaxis.  No foreign bodies. Right sinus exhibits maxillary sinus tenderness and frontal sinus tenderness. Left sinus exhibits maxillary sinus tenderness and frontal sinus tenderness.  Mouth/Throat: Uvula is midline, oropharynx is clear and moist and mucous membranes are normal. No oropharyngeal exudate.  Eyes: Pupils are equal, round, and reactive to light. Conjunctivae and EOM are normal. Right eye exhibits no discharge. Left eye exhibits no discharge. No scleral icterus.  Neck: Normal range of motion. Neck supple. No thyromegaly present.  Cardiovascular: Normal rate, regular rhythm and normal heart sounds.   Pulmonary/Chest: Effort normal and breath sounds normal. No respiratory distress. She has no wheezes. She has no rales.  Lymphadenopathy:    She has no cervical adenopathy.  Skin: She is not diaphoretic.  Nursing note and vitals reviewed.    UC Treatments / Results  Labs (all labs ordered are listed, but only abnormal results are displayed) Labs Reviewed - No data to display  EKG  EKG Interpretation None       Radiology No results found.  Procedures Procedures (including critical care time)  Medications Ordered in UC Medications - No data to display   Initial Impression / Assessment and Plan / UC Course  I have reviewed the triage vital signs and the nursing notes.  Pertinent labs & imaging results that were available during my care of the patient were reviewed by me and considered in my medical decision making (see chart for details).       Final Clinical Impressions(s) / UC Diagnoses   Final diagnoses:  Acute maxillary sinusitis, recurrence not specified    Mucopurulent chronic bronchitis ([place])  Bronchospasm    New Prescriptions Discharge Medication List as of 09/08/2017 11:55 AM    START taking these medications   Details  albuterol (PROVENTIL HFA;VENTOLIN HFA) 108 (90 Base) MCG/ACT inhaler Inhale 1-2 puffs into the lungs every 6 (six) hours as needed for wheezing or shortness of breath., Starting Sun 09/08/2017, Normal    doxycycline (VIBRA-TABS) 100 MG tablet Take 1 tablet (100 mg total) by mouth 2 (two) times daily., Starting Sun 09/08/2017, Normal       1.  diagnosis reviewed with patient/parent/guardian/family 2. rx as per orders above; reviewed possible side effects, interactions, risks and benefits  3. Follow-up prn if symptoms worsen or don't improve   Controlled Substance Prescriptions [place] Controlled Substance Registry consulted? Not Applicable   Norval Gable, MD 09/08/17 1229

## 2017-09-08 NOTE — ED Triage Notes (Signed)
Productive cough (green phelgm) has moved into chest,  facial pressure, congested, for 5 days

## 2017-09-10 ENCOUNTER — Ambulatory Visit (INDEPENDENT_AMBULATORY_CARE_PROVIDER_SITE_OTHER): Payer: 59

## 2017-09-10 ENCOUNTER — Ambulatory Visit (INDEPENDENT_AMBULATORY_CARE_PROVIDER_SITE_OTHER): Payer: 59 | Admitting: Internal Medicine

## 2017-09-10 ENCOUNTER — Encounter: Payer: Self-pay | Admitting: Internal Medicine

## 2017-09-10 VITALS — BP 140/78 | HR 93 | Temp 98.6°F | Resp 16 | Ht 68.0 in | Wt 191.4 lb

## 2017-09-10 DIAGNOSIS — E7800 Pure hypercholesterolemia, unspecified: Secondary | ICD-10-CM

## 2017-09-10 DIAGNOSIS — Z720 Tobacco use: Secondary | ICD-10-CM | POA: Diagnosis not present

## 2017-09-10 DIAGNOSIS — E559 Vitamin D deficiency, unspecified: Secondary | ICD-10-CM | POA: Diagnosis not present

## 2017-09-10 DIAGNOSIS — R062 Wheezing: Secondary | ICD-10-CM | POA: Diagnosis not present

## 2017-09-10 DIAGNOSIS — I10 Essential (primary) hypertension: Secondary | ICD-10-CM | POA: Diagnosis not present

## 2017-09-10 DIAGNOSIS — R059 Cough, unspecified: Secondary | ICD-10-CM

## 2017-09-10 DIAGNOSIS — R05 Cough: Secondary | ICD-10-CM

## 2017-09-10 LAB — COMPREHENSIVE METABOLIC PANEL
ALK PHOS: 88 U/L (ref 39–117)
ALT: 9 U/L (ref 0–35)
AST: 9 U/L (ref 0–37)
Albumin: 3.9 g/dL (ref 3.5–5.2)
BILIRUBIN TOTAL: 0.5 mg/dL (ref 0.2–1.2)
BUN: 10 mg/dL (ref 6–23)
CO2: 31 mEq/L (ref 19–32)
Calcium: 9.3 mg/dL (ref 8.4–10.5)
Chloride: 104 mEq/L (ref 96–112)
Creatinine, Ser: 0.84 mg/dL (ref 0.40–1.20)
GFR: 72.81 mL/min (ref >60.00)
GLUCOSE: 83 mg/dL (ref 70–99)
POTASSIUM: 3.6 meq/L (ref 3.5–5.1)
Sodium: 143 mEq/L (ref 135–145)
TOTAL PROTEIN: 6.8 g/dL (ref 6.0–8.3)

## 2017-09-10 LAB — LIPID PANEL
Cholesterol: 189 mg/dL (ref 0–200)
HDL: 32.4 mg/dL — AB (ref >39.00)
LDL Cholesterol: 130 mg/dL — ABNORMAL HIGH (ref 0–99)
NonHDL: 156.87
TRIGLYCERIDES: 135 mg/dL (ref 0.0–149.0)
Total CHOL/HDL Ratio: 6
VLDL: 27 mg/dL (ref 0.0–40.0)

## 2017-09-10 NOTE — Progress Notes (Signed)
Patient ID: Joelyn Andreason, female   DOB: 03-03-1956, 61 y.o.   MRN: [id number]   Subjective:    Patient ID: [name], female    DOB: 1956-04-06, 61 y.o.   MRN: [id number]  HPI  Patient here for a scheduled follow up.  Reports that she started noticing ears stopped up and then symptoms progressed.  Increased cough and congestion.  Went to urgent care this past weekend.  Started on prednisone and doxycycline.  Just started.  Still with increased cough and congestion.  Taking robitussin DM.  No vomiting.  No chest pain.  Tries to stay active.  No sob.  No diarrhea.  Overall states she has been doing well.     Past Medical History:  Diagnosis Date  . GERD (gastroesophageal reflux disease)    neg H. pylori  . Hyperlipidemia   . Hypertension   . Vitamin D deficiency    Past Surgical History:  Procedure Laterality Date  . CHOLECYSTECTOMY  1994  . rectal fissure repair  2002  . TUBAL LIGATION  1995   Family History  Problem Relation Age of Onset  . Hypertension Mother   . Heart disease Mother   . Hypercholesterolemia Brother   . Colon cancer Neg Hx   . Breast cancer Neg Hx    Social History   Social History  . Marital status: Married    Spouse name: N/A  . Number of children: 1  . Years of education: N/A   Social History Main Topics  . Smoking status: Current Every Day Smoker    Packs/day: 0.50    Years: 30.00    Types: Cigarettes  . Smokeless tobacco: Never Used  . Alcohol use No  . Drug use: No  . Sexual activity: Not Asked   Other Topics Concern  . None   Social History Narrative  . None    Outpatient Encounter Prescriptions as of 09/10/2017  Medication Sig  . albuterol (PROVENTIL HFA;VENTOLIN HFA) 108 (90 Base) MCG/ACT inhaler Inhale 1-2 puffs into the lungs every 6 (six) hours as needed for wheezing or shortness of breath.  . cholecalciferol (VITAMIN D) 1000 units tablet Take 1,000 Units by mouth daily.  Marland Kitchen doxycycline (VIBRA-TABS) 100  MG tablet Take 1 tablet (100 mg total) by mouth 2 (two) times daily.  . predniSONE (DELTASONE) 20 MG tablet Take 1 tablet (20 mg total) by mouth daily.  . Cholecalciferol (VITAMIN D-1000 MAX ST) 1000 units tablet Take by mouth.  . [DISCONTINUED] Calcium Carb-Cholecalciferol (CALCIUM-VITAMIN D) 500-200 MG-UNIT tablet Take 1 tablet by mouth daily.   No facility-administered encounter medications on file as of 09/10/2017.     Review of Systems  Constitutional: Negative for appetite change and unexpected weight change.  HENT: Positive for congestion. Negative for sore throat.   Respiratory: Positive for cough. Negative for chest tightness and shortness of breath.   Cardiovascular: Negative for chest pain, palpitations and leg swelling.  Gastrointestinal: Negative for abdominal pain, diarrhea, nausea and vomiting.  Genitourinary: Negative for difficulty urinating and dysuria.  Musculoskeletal: Negative for back pain and joint swelling.  Skin: Negative for color change and rash.  Neurological: Negative for dizziness, light-headedness and headaches.  Psychiatric/Behavioral: Negative for agitation and dysphoric mood.       Objective:     Blood pressure rechecked by me:  134-136/84  Physical Exam  Constitutional: She appears well-developed and well-nourished. No distress.  HENT:  Nose: Nose normal.  Mouth/Throat: Oropharynx is clear and moist.  Neck:  Neck supple. No thyromegaly present.  Cardiovascular: Normal rate and regular rhythm.   Pulmonary/Chest: Breath sounds normal. No respiratory distress. She has no wheezes.  Increased cough with expiration.    Abdominal: Soft. Bowel sounds are normal. There is no tenderness.  Musculoskeletal: She exhibits no edema or tenderness.  Lymphadenopathy:    She has no cervical adenopathy.  Skin: No rash noted. No erythema.  Psychiatric: She has a normal mood and affect. Her behavior is normal.    BP 140/78 (BP Location: Left Arm, Patient  Position: Sitting, Cuff Size: Normal)   Pulse 93   Temp 98.6 F (37 C) (Oral)   Resp 16   Ht 5\' 8"  (1.727 m)   Wt 191 lb 6.4 oz (86.8 kg)   LMP 12/08/1997   SpO2 96%   BMI 29.10 kg/m  Wt Readings from Last 3 Encounters:  09/10/17 191 lb 6.4 oz (86.8 kg)  09/08/17 192 lb (87.1 kg)  06/03/17 186 lb (84.4 kg)     Lab Results  Component Value Date   WBC 8.0 03/05/2017   HGB 15.1 (H) 03/05/2017   HCT 45.3 03/05/2017   PLT 231.0 03/05/2017   GLUCOSE 83 09/10/2017   CHOL 189 09/10/2017   TRIG 135.0 09/10/2017   HDL 32.40 (L) 09/10/2017   LDLDIRECT 140.0 09/04/2016   LDLCALC 130 (H) 09/10/2017   ALT 9 09/10/2017   AST 9 09/10/2017   NA 143 09/10/2017   K 3.6 09/10/2017   CL 104 09/10/2017   CREATININE 0.84 09/10/2017   BUN 10 09/10/2017   CO2 31 09/10/2017   TSH 1.37 03/05/2017       Assessment & Plan:   Problem List Items Addressed This Visit    Hypercholesterolemia - Primary    Low cholesterol diet and exercise.  Follow lipid panel.        Relevant Orders   Lipid panel (Completed)   Comprehensive metabolic panel (Completed)   Hypertension    Blood pressure per her report has been ok.  Recheck improved.  Have her spot check her pressure and send in readings.  Follow.        Tobacco use    Discussed with her today the need to quit smoking.  She declines to quit.  Follow.        Vitamin D deficiency    Follow vitamin D level.         Other Visit Diagnoses    Cough       Evaluated and prescribed doxycycline and prednisone.  just started.  check cxr.  continue robitussin DM.  Follow closely.  call if progresstion or do not resolv   Relevant Orders   DG Chest 2 View (Completed)   Wheezing       Relevant Orders   DG Chest 2 View (Completed)       Dale Quapaw, MD

## 2017-09-12 ENCOUNTER — Other Ambulatory Visit: Payer: Self-pay | Admitting: Internal Medicine

## 2017-09-12 DIAGNOSIS — R9389 Abnormal findings on diagnostic imaging of other specified body structures: Secondary | ICD-10-CM

## 2017-09-12 NOTE — Progress Notes (Signed)
Order placed for f/u cxr.  

## 2017-09-13 ENCOUNTER — Encounter: Payer: Self-pay | Admitting: Internal Medicine

## 2017-09-13 NOTE — Assessment & Plan Note (Signed)
Follow vitamin D level.  

## 2017-09-13 NOTE — Assessment & Plan Note (Signed)
Low cholesterol diet and exercise.  Follow lipid panel.   

## 2017-09-13 NOTE — Assessment & Plan Note (Signed)
Blood pressure per her report has been ok.  Recheck improved.  Have her spot check her pressure and send in readings.  Follow.

## 2017-09-13 NOTE — Assessment & Plan Note (Signed)
Discussed with her today the need to quit smoking.  She declines to quit.  Follow.

## 2017-09-23 DIAGNOSIS — J019 Acute sinusitis, unspecified: Secondary | ICD-10-CM | POA: Diagnosis not present

## 2017-11-22 DIAGNOSIS — H1030 Unspecified acute conjunctivitis, unspecified eye: Secondary | ICD-10-CM | POA: Diagnosis not present

## 2017-12-04 DIAGNOSIS — H15101 Unspecified episcleritis, right eye: Secondary | ICD-10-CM | POA: Diagnosis not present

## 2017-12-16 DIAGNOSIS — L03213 Periorbital cellulitis: Secondary | ICD-10-CM | POA: Diagnosis not present

## 2017-12-24 DIAGNOSIS — H05019 Cellulitis of unspecified orbit: Secondary | ICD-10-CM | POA: Diagnosis not present

## 2017-12-24 DIAGNOSIS — R51 Headache: Secondary | ICD-10-CM | POA: Diagnosis not present

## 2017-12-24 DIAGNOSIS — H5711 Ocular pain, right eye: Secondary | ICD-10-CM | POA: Diagnosis not present

## 2017-12-24 DIAGNOSIS — Z880 Allergy status to penicillin: Secondary | ICD-10-CM | POA: Diagnosis not present

## 2017-12-24 DIAGNOSIS — L03213 Periorbital cellulitis: Secondary | ICD-10-CM | POA: Diagnosis not present

## 2017-12-24 DIAGNOSIS — H532 Diplopia: Secondary | ICD-10-CM | POA: Diagnosis not present

## 2017-12-24 DIAGNOSIS — H0589 Other disorders of orbit: Secondary | ICD-10-CM | POA: Diagnosis not present

## 2017-12-24 DIAGNOSIS — C8590 Non-Hodgkin lymphoma, unspecified, unspecified site: Secondary | ICD-10-CM | POA: Diagnosis not present

## 2017-12-25 DIAGNOSIS — H0589 Other disorders of orbit: Secondary | ICD-10-CM | POA: Diagnosis not present

## 2018-01-01 DIAGNOSIS — J310 Chronic rhinitis: Secondary | ICD-10-CM | POA: Diagnosis not present

## 2018-01-01 DIAGNOSIS — H5789 Other specified disorders of eye and adnexa: Secondary | ICD-10-CM | POA: Diagnosis not present

## 2018-01-01 DIAGNOSIS — J3489 Other specified disorders of nose and nasal sinuses: Secondary | ICD-10-CM | POA: Diagnosis not present

## 2018-01-06 DIAGNOSIS — H5789 Other specified disorders of eye and adnexa: Secondary | ICD-10-CM | POA: Diagnosis not present

## 2018-01-06 DIAGNOSIS — H05221 Edema of right orbit: Secondary | ICD-10-CM | POA: Diagnosis not present

## 2018-01-06 DIAGNOSIS — F17210 Nicotine dependence, cigarettes, uncomplicated: Secondary | ICD-10-CM | POA: Diagnosis not present

## 2018-01-06 DIAGNOSIS — J3489 Other specified disorders of nose and nasal sinuses: Secondary | ICD-10-CM | POA: Diagnosis not present

## 2018-01-06 DIAGNOSIS — H0589 Other disorders of orbit: Secondary | ICD-10-CM | POA: Diagnosis not present

## 2018-01-06 MED ORDER — LACTATED RINGERS IV SOLN
10.00 | INTRAVENOUS | Status: DC
Start: ? — End: 2018-01-06

## 2018-01-06 MED ORDER — HYDROMORPHONE HCL 1 MG/ML IJ SOLN
0.20 mg | INTRAMUSCULAR | Status: DC
Start: ? — End: 2018-01-06

## 2018-01-06 MED ORDER — PROMETHAZINE HCL 25 MG/ML IJ SOLN
6.25 mg | INTRAMUSCULAR | Status: DC
Start: ? — End: 2018-01-06

## 2018-02-03 HISTORY — PX: OTHER SURGICAL HISTORY: SHX169

## 2018-03-10 ENCOUNTER — Encounter: Payer: 59 | Admitting: Internal Medicine

## 2018-05-28 DIAGNOSIS — H0510 Unspecified chronic inflammatory disorders of orbit: Secondary | ICD-10-CM | POA: Diagnosis not present

## 2018-06-09 ENCOUNTER — Ambulatory Visit (INDEPENDENT_AMBULATORY_CARE_PROVIDER_SITE_OTHER): Payer: 59 | Admitting: Internal Medicine

## 2018-06-09 ENCOUNTER — Encounter: Payer: Self-pay | Admitting: Internal Medicine

## 2018-06-09 VITALS — BP 126/76 | HR 77 | Temp 98.4°F | Resp 18 | Ht 68.0 in | Wt 196.0 lb

## 2018-06-09 DIAGNOSIS — E559 Vitamin D deficiency, unspecified: Secondary | ICD-10-CM

## 2018-06-09 DIAGNOSIS — Z0000 Encounter for general adult medical examination without abnormal findings: Secondary | ICD-10-CM | POA: Diagnosis not present

## 2018-06-09 DIAGNOSIS — E7800 Pure hypercholesterolemia, unspecified: Secondary | ICD-10-CM | POA: Diagnosis not present

## 2018-06-09 DIAGNOSIS — H0589 Other disorders of orbit: Secondary | ICD-10-CM

## 2018-06-09 DIAGNOSIS — Z720 Tobacco use: Secondary | ICD-10-CM

## 2018-06-09 NOTE — Progress Notes (Signed)
Patient ID: Carrie Wilson, female   DOB: 1955-11-27, 62 y.o.   MRN: [id number]   Subjective:    Patient ID: Carrie Wilson, female    DOB: 05-09-1956, 62 y.o.   MRN: [id number]  HPI  Patient here for her physical exam.  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  Treated with prednisone.  Recurred and has been started back on prednisone.  She has been referred to rheumatology for evaluation to consider a steroid sparing options.  Some increased stress related to this, but overall she feels she is handling things well.  Trying to stay active.  No chest pain.  Breathing stable.  She has cut down on her smoking.  No acid reflux.  No abdominal pain.  Bowels moving.  Had colonoscopy 2011.  She reports was told f/u in 10 years.     Past Medical History:  Diagnosis Date  . GERD (gastroesophageal reflux disease)    neg H. pylori  . Hyperlipidemia   . Hypertension   . Vitamin D deficiency    Past Surgical History:  Procedure Laterality Date  . CHOLECYSTECTOMY  1994  . rectal fissure repair  2002  . TUBAL LIGATION  1995   Family History  Problem Relation Age of Onset  . Hypertension Mother   . Heart disease Mother   . Hypercholesterolemia Brother   . Colon cancer Neg Hx   . Breast cancer Neg Hx    Social History   Socioeconomic History  . Marital status: Married    Spouse name: Not on file  . Number of children: 1  . Years of education: Not on file  . Highest education level: Not on file  Occupational History  . Not on file  Social Needs  . Financial resource strain: Not on file  . Food insecurity:    Worry: Not on file    Inability: Not on file  . Transportation needs:    Medical: Not on file    Non-medical: Not on file  Tobacco Use  . Smoking status: Current Every Day Smoker    Packs/day: 0.50    Years: 30.00    Pack years: 15.00    Types: Cigarettes  . Smokeless tobacco: Never Used  Substance and Sexual  Activity  . Alcohol use: No    Alcohol/week: 0.0 oz  . Drug use: No  . Sexual activity: Not on file  Lifestyle  . Physical activity:    Days per week: Not on file    Minutes per session: Not on file  . Stress: Not on file  Relationships  . Social connections:    Talks on phone: Not on file    Gets together: Not on file    Attends religious service: Not on file    Active member of club or organization: Not on file    Attends meetings of clubs or organizations: Not on file    Relationship status: Not on file  Other Topics Concern  . Not on file  Social History Narrative  . Not on file    Outpatient Encounter Medications as of 06/09/2018  Medication Sig  . predniSONE (DELTASONE) 20 MG tablet 40mg  daily for two weeks, then 30 mg daily for 2 weeks, then 20mg  daily for 1 week, then 10mg  daily thereafter  . cholecalciferol (VITAMIN D) 1000 units tablet Take 1,000 Units by mouth daily.  . [DISCONTINUED] albuterol (PROVENTIL HFA;VENTOLIN HFA) 108 (90 Base) MCG/ACT inhaler Inhale 1-2 puffs  into the lungs every 6 (six) hours as needed for wheezing or shortness of breath.  . [DISCONTINUED] Cholecalciferol (VITAMIN D-1000 MAX ST) 1000 units tablet Take by mouth.  . [DISCONTINUED] doxycycline (VIBRA-TABS) 100 MG tablet Take 1 tablet (100 mg total) by mouth 2 (two) times daily.  . [DISCONTINUED] predniSONE (DELTASONE) 20 MG tablet Take 1 tablet (20 mg total) by mouth daily.   No facility-administered encounter medications on file as of 06/09/2018.     Review of Systems  Constitutional: Negative for appetite change and unexpected weight change.  HENT: Negative for congestion and sinus pressure.   Eyes: Negative for pain.       Some residual changes from her current eye issues.  S/p orbitotomy.  Being followed by ophthalmology.    Respiratory: Negative for cough, chest tightness and shortness of breath.   Cardiovascular: Negative for chest pain, palpitations and leg swelling.    Gastrointestinal: Negative for abdominal pain, diarrhea, nausea and vomiting.  Genitourinary: Negative for difficulty urinating and dysuria.  Musculoskeletal: Negative for joint swelling and myalgias.  Skin: Negative for color change and rash.  Neurological: Negative for dizziness, light-headedness and headaches.  Hematological: Negative for adenopathy. Does not bruise/bleed easily.  Psychiatric/Behavioral: Negative for agitation and dysphoric mood.       Objective:    Physical Exam  Constitutional: She is oriented to person, place, and time. She appears well-developed and well-nourished. No distress.  HENT:  Nose: Nose normal.  Mouth/Throat: Oropharynx is clear and moist.  Eyes: Right eye exhibits no discharge. Left eye exhibits no discharge. No scleral icterus.  Neck: Neck supple. No thyromegaly present.  Cardiovascular: Normal rate and regular rhythm.  Pulmonary/Chest: Breath sounds normal. No accessory muscle usage. No tachypnea. No respiratory distress. She has no decreased breath sounds. She has no wheezes. She has no rhonchi. Right breast exhibits no inverted nipple, no mass, no nipple discharge and no tenderness (no axillary adenopathy). Left breast exhibits no inverted nipple, no mass, no nipple discharge and no tenderness (no axilarry adenopathy).  Abdominal: Soft. Bowel sounds are normal. There is no tenderness.  Musculoskeletal: She exhibits no edema or tenderness.  Lymphadenopathy:    She has no cervical adenopathy.  Neurological: She is alert and oriented to person, place, and time.  Skin: No rash noted. No erythema.  Psychiatric: She has a normal mood and affect. Her behavior is normal.    BP 126/76 (BP Location: Left Arm, Patient Position: Sitting, Cuff Size: Normal)   Pulse 77   Temp 98.4 F (36.9 C) (Oral)   Resp 18   Ht 5\' 8"  (1.727 m)   Wt 196 lb (88.9 kg)   LMP 12/08/1997   SpO2 97%   BMI 29.80 kg/m  Wt Readings from Last 3 Encounters:  06/09/18 196 lb  (88.9 kg)  09/10/17 191 lb 6.4 oz (86.8 kg)  09/08/17 192 lb (87.1 kg)     Lab Results  Component Value Date   WBC 8.0 03/05/2017   HGB 15.1 (H) 03/05/2017   HCT 45.3 03/05/2017   PLT 231.0 03/05/2017   GLUCOSE 83 09/10/2017   CHOL 189 09/10/2017   TRIG 135.0 09/10/2017   HDL 32.40 (L) 09/10/2017   LDLDIRECT 140.0 09/04/2016   LDLCALC 130 (H) 09/10/2017   ALT 9 09/10/2017   AST 9 09/10/2017   NA 143 09/10/2017   K 3.6 09/10/2017   CL 104 09/10/2017   CREATININE 0.84 09/10/2017   BUN 10 09/10/2017   CO2 31 09/10/2017  TSH 1.37 03/05/2017       Assessment & Plan:   Problem List Items Addressed This Visit    Health care maintenance    Physical today 06/09/18.  PAP 03/05/17 - negative HPV.  Mammogram 03/28/17 - Birads I.  Schedule f/u mammogram.  Colonoscopy 03/2010.  States recommended f/u in 10 years. Per report 5 years.  Need to clarify with GI.        Hypercholesterolemia    Low cholesterol diet and exercise.  Follow lipid panel.        Relevant Orders   CBC with Differential/Platelet   Hepatic function panel   Lipid panel   TSH   Basic metabolic panel   Orbital mass    S/p orbitotomy.  Followed by ophthalmology.  Back on prednisone.  Due to see rheumatology to discuss other treatment options as outlined.        Tobacco use    Has cut down. Encouraged her to continue to try to quit.  Follow.       Vitamin D deficiency    Follow vitamin D level.        Relevant Orders   VITAMIN D 25 Hydroxy (Vit-D Deficiency, Fractures)    Other Visit Diagnoses    Routine general medical examination at a health care facility    -  Primary       Dale [place], MD

## 2018-06-12 ENCOUNTER — Encounter: Payer: Self-pay | Admitting: Internal Medicine

## 2018-06-12 DIAGNOSIS — H0589 Other disorders of orbit: Secondary | ICD-10-CM | POA: Insufficient documentation

## 2018-06-12 NOTE — Assessment & Plan Note (Signed)
Physical today 06/09/18.  PAP 03/05/17 - negative HPV.  Mammogram 03/28/17 - Birads I.  Schedule f/u mammogram.  Colonoscopy 03/2010.  States recommended f/u in 10 years. Per report 5 years.  Need to clarify with GI.

## 2018-06-12 NOTE — Assessment & Plan Note (Signed)
S/p orbitotomy.  Followed by ophthalmology.  Back on prednisone.  Due to see rheumatology to discuss other treatment options as outlined.

## 2018-06-12 NOTE — Assessment & Plan Note (Signed)
Has cut down. Encouraged her to continue to try to quit.  Follow.

## 2018-06-12 NOTE — Assessment & Plan Note (Signed)
Follow vitamin D level.  

## 2018-06-12 NOTE — Assessment & Plan Note (Signed)
Low cholesterol diet and exercise.  Follow lipid panel.   

## 2018-06-18 ENCOUNTER — Other Ambulatory Visit: Payer: Self-pay | Admitting: Internal Medicine

## 2018-06-18 ENCOUNTER — Other Ambulatory Visit (INDEPENDENT_AMBULATORY_CARE_PROVIDER_SITE_OTHER): Payer: 59

## 2018-06-18 DIAGNOSIS — E7800 Pure hypercholesterolemia, unspecified: Secondary | ICD-10-CM | POA: Diagnosis not present

## 2018-06-18 DIAGNOSIS — E559 Vitamin D deficiency, unspecified: Secondary | ICD-10-CM

## 2018-06-18 DIAGNOSIS — R7989 Other specified abnormal findings of blood chemistry: Secondary | ICD-10-CM

## 2018-06-18 LAB — LIPID PANEL
CHOLESTEROL: 183 mg/dL (ref 0–200)
HDL: 56.5 mg/dL (ref >39.00)
NonHDL: 126.9
Total CHOL/HDL Ratio: 3
Triglycerides: 205 mg/dL — ABNORMAL HIGH (ref 0.0–149.0)
VLDL: 41 mg/dL — ABNORMAL HIGH (ref 0.0–40.0)

## 2018-06-18 LAB — CBC WITH DIFFERENTIAL/PLATELET
BASOS ABS: 0.1 10*3/uL (ref 0.0–0.1)
BASOS PCT: 0.4 % (ref 0.0–3.0)
EOS ABS: 0.1 10*3/uL (ref 0.0–0.7)
Eosinophils Relative: 0.6 % (ref 0.0–5.0)
HCT: 47 % — ABNORMAL HIGH (ref 36.0–46.0)
Hemoglobin: 15.4 g/dL — ABNORMAL HIGH (ref 12.0–15.0)
LYMPHS ABS: 4.6 10*3/uL — AB (ref 0.7–4.0)
Lymphocytes Relative: 35.4 % (ref 12.0–46.0)
MCHC: 32.8 g/dL (ref 30.0–36.0)
MCV: 94 fl (ref 78.0–100.0)
Monocytes Absolute: 0.8 10*3/uL (ref 0.1–1.0)
Monocytes Relative: 6.1 % (ref 3.0–12.0)
NEUTROS ABS: 7.5 10*3/uL (ref 1.4–7.7)
NEUTROS PCT: 57.5 % (ref 43.0–77.0)
PLATELETS: 202 10*3/uL (ref 150.0–400.0)
RBC: 5 Mil/uL (ref 3.87–5.11)
RDW: 13.2 % (ref 11.5–15.5)
WBC: 13.1 10*3/uL — ABNORMAL HIGH (ref 4.0–10.5)

## 2018-06-18 LAB — LDL CHOLESTEROL, DIRECT: Direct LDL: 110 mg/dL

## 2018-06-18 LAB — BASIC METABOLIC PANEL
BUN: 12 mg/dL (ref 6–23)
CALCIUM: 9.2 mg/dL (ref 8.4–10.5)
CO2: 32 meq/L (ref 19–32)
Chloride: 103 mEq/L (ref 96–112)
Creatinine, Ser: 1.05 mg/dL (ref 0.40–1.20)
GFR: 56.14 mL/min — AB (ref >60.00)
Glucose, Bld: 94 mg/dL (ref 70–99)
Potassium: 3.8 mEq/L (ref 3.5–5.1)
SODIUM: 143 meq/L (ref 135–145)

## 2018-06-18 LAB — HEPATIC FUNCTION PANEL
ALK PHOS: 59 U/L (ref 39–117)
ALT: 9 U/L (ref 0–35)
AST: 7 U/L (ref 0–37)
Albumin: 3.9 g/dL (ref 3.5–5.2)
BILIRUBIN DIRECT: 0.1 mg/dL (ref 0.0–0.3)
TOTAL PROTEIN: 6.3 g/dL (ref 6.0–8.3)
Total Bilirubin: 0.8 mg/dL (ref 0.2–1.2)

## 2018-06-18 LAB — VITAMIN D 25 HYDROXY (VIT D DEFICIENCY, FRACTURES): VITD: 25.21 ng/mL — ABNORMAL LOW (ref 30.00–100.00)

## 2018-06-18 LAB — TSH: TSH: 3.32 u[IU]/mL (ref 0.35–4.50)

## 2018-06-18 NOTE — Progress Notes (Signed)
Order placed for f/u lab.   

## 2018-06-19 ENCOUNTER — Other Ambulatory Visit: Payer: Self-pay | Admitting: Internal Medicine

## 2018-06-19 DIAGNOSIS — Z1231 Encounter for screening mammogram for malignant neoplasm of breast: Secondary | ICD-10-CM

## 2018-06-20 DIAGNOSIS — H0510 Unspecified chronic inflammatory disorders of orbit: Secondary | ICD-10-CM | POA: Diagnosis not present

## 2018-06-20 DIAGNOSIS — R062 Wheezing: Secondary | ICD-10-CM | POA: Diagnosis not present

## 2018-06-20 DIAGNOSIS — Z87891 Personal history of nicotine dependence: Secondary | ICD-10-CM | POA: Diagnosis not present

## 2018-06-20 DIAGNOSIS — R220 Localized swelling, mass and lump, head: Secondary | ICD-10-CM | POA: Diagnosis not present

## 2018-06-20 DIAGNOSIS — R29818 Other symptoms and signs involving the nervous system: Secondary | ICD-10-CM | POA: Diagnosis not present

## 2018-07-07 ENCOUNTER — Ambulatory Visit
Admission: RE | Admit: 2018-07-07 | Discharge: 2018-07-07 | Disposition: A | Discharge status: Home / Self Care | Payer: 59 | Source: Ambulatory Visit | Attending: Internal Medicine | Admitting: Internal Medicine

## 2018-07-07 DIAGNOSIS — Z1231 Encounter for screening mammogram for malignant neoplasm of breast: Secondary | ICD-10-CM | POA: Insufficient documentation

## 2018-07-11 ENCOUNTER — Other Ambulatory Visit (INDEPENDENT_AMBULATORY_CARE_PROVIDER_SITE_OTHER): Payer: 59

## 2018-07-11 DIAGNOSIS — R7989 Other specified abnormal findings of blood chemistry: Secondary | ICD-10-CM | POA: Diagnosis not present

## 2018-07-11 LAB — BASIC METABOLIC PANEL
BUN: 13 mg/dL (ref 6–23)
CO2: 30 meq/L (ref 19–32)
Calcium: 9.5 mg/dL (ref 8.4–10.5)
Chloride: 101 mEq/L (ref 96–112)
Creatinine, Ser: 1.12 mg/dL (ref 0.40–1.20)
GFR: 52.1 mL/min — ABNORMAL LOW (ref >60.00)
Glucose, Bld: 200 mg/dL — ABNORMAL HIGH (ref 70–99)
Potassium: 4.2 mEq/L (ref 3.5–5.1)
SODIUM: 138 meq/L (ref 135–145)

## 2018-07-24 ENCOUNTER — Telehealth: Payer: Self-pay | Admitting: Radiology

## 2018-07-24 NOTE — Telephone Encounter (Signed)
Pt coming in for labs Tuesday, please place future orders. Thank you.

## 2018-07-25 ENCOUNTER — Other Ambulatory Visit: Payer: Self-pay | Admitting: Internal Medicine

## 2018-07-25 DIAGNOSIS — R739 Hyperglycemia, unspecified: Secondary | ICD-10-CM

## 2018-07-25 DIAGNOSIS — R944 Abnormal results of kidney function studies: Secondary | ICD-10-CM

## 2018-07-25 NOTE — Progress Notes (Signed)
Order placed for f/u labs.  

## 2018-07-25 NOTE — Telephone Encounter (Signed)
Orders placed for labs

## 2018-07-29 ENCOUNTER — Other Ambulatory Visit (INDEPENDENT_AMBULATORY_CARE_PROVIDER_SITE_OTHER): Payer: 59

## 2018-07-29 DIAGNOSIS — R944 Abnormal results of kidney function studies: Secondary | ICD-10-CM

## 2018-07-29 DIAGNOSIS — R739 Hyperglycemia, unspecified: Secondary | ICD-10-CM

## 2018-07-29 LAB — URINALYSIS, ROUTINE W REFLEX MICROSCOPIC
Bilirubin Urine: NEGATIVE
HGB URINE DIPSTICK: NEGATIVE
Ketones, ur: NEGATIVE
NITRITE: NEGATIVE
RBC / HPF: NONE SEEN (ref 0–?)
Specific Gravity, Urine: >=1.03 — AB (ref 1.000–1.030)
Total Protein, Urine: NEGATIVE
Urine Glucose: NEGATIVE
Urobilinogen, UA: 0.2 (ref 0.0–1.0)
pH: 5.5 (ref 5.0–8.0)

## 2018-07-29 LAB — BASIC METABOLIC PANEL
BUN: 7 mg/dL (ref 6–23)
CHLORIDE: 107 meq/L (ref 96–112)
CO2: 27 mEq/L (ref 19–32)
Calcium: 9 mg/dL (ref 8.4–10.5)
Creatinine, Ser: 0.83 mg/dL (ref 0.40–1.20)
GFR: 73.62 mL/min (ref >60.00)
Glucose, Bld: 98 mg/dL (ref 70–99)
POTASSIUM: 3.7 meq/L (ref 3.5–5.1)
Sodium: 143 mEq/L (ref 135–145)

## 2018-07-29 LAB — HEMOGLOBIN A1C: Hgb A1c MFr Bld: 7.5 % — ABNORMAL HIGH (ref 4.6–6.5)

## 2018-08-05 ENCOUNTER — Encounter: Payer: Self-pay | Admitting: Internal Medicine

## 2018-08-05 ENCOUNTER — Ambulatory Visit: Payer: 59 | Admitting: Internal Medicine

## 2018-08-05 VITALS — BP 138/82 | HR 83 | Temp 98.1°F | Resp 18 | Wt 195.6 lb

## 2018-08-05 DIAGNOSIS — H0589 Other disorders of orbit: Secondary | ICD-10-CM

## 2018-08-05 DIAGNOSIS — D72829 Elevated white blood cell count, unspecified: Secondary | ICD-10-CM | POA: Diagnosis not present

## 2018-08-05 DIAGNOSIS — E119 Type 2 diabetes mellitus without complications: Secondary | ICD-10-CM

## 2018-08-05 DIAGNOSIS — Z720 Tobacco use: Secondary | ICD-10-CM

## 2018-08-05 DIAGNOSIS — E559 Vitamin D deficiency, unspecified: Secondary | ICD-10-CM

## 2018-08-05 DIAGNOSIS — E7800 Pure hypercholesterolemia, unspecified: Secondary | ICD-10-CM | POA: Diagnosis not present

## 2018-08-05 DIAGNOSIS — I10 Essential (primary) hypertension: Secondary | ICD-10-CM

## 2018-08-05 MED ORDER — METFORMIN HCL 500 MG PO TABS: 500.0000 mg | ORAL_TABLET | Freq: Every day | ORAL | 3 refills | Status: DC

## 2018-08-05 NOTE — Progress Notes (Signed)
Patient ID: Carrie Wilson, female   DOB: [birth year], 62 y.o.   MRN: [id number]   Subjective:    Patient ID: Carrie Wilson, female    DOB: 01-Apr-1956, 62 y.o.   MRN: [id number]  HPI  Patient here as a work in to discussed her elevated a1c.  She has been followed by ophthalmology (and recently saw rheumatology) for right orbital inflammatory syndrome.  Started on MTX.  Was on prednisone.  Off now.  a1c on 07/1718 - 7.5.  Discussed diet adjustment.  Discussed exercise.  Also discussed treatment options.  She is in agreement to start metformin.  Eating. No nausea or vomiting.  No chest pain.  Breathing stable.  No abdominal pain.  Bowels moving.      Past Medical History:  Diagnosis Date  . GERD (gastroesophageal reflux disease)    neg H. pylori  . Hyperlipidemia   . Hypertension   . Vitamin D deficiency    Past Surgical History:  Procedure Laterality Date  . CHOLECYSTECTOMY  1994  . rectal fissure repair  2002  . TUBAL LIGATION  1995   Family History  Problem Relation Age of Onset  . Hypertension Mother   . Heart disease Mother   . Hypercholesterolemia Brother   . Colon cancer Neg Hx   . Breast cancer Neg Hx    Social History   Socioeconomic History  . Marital status: Married    Spouse name: Not on file  . Number of children: 1  . Years of education: Not on file  . Highest education level: Not on file  Occupational History  . Not on file  Social Needs  . Financial resource strain: Not on file  . Food insecurity:    Worry: Not on file    Inability: Not on file  . Transportation needs:    Medical: Not on file    Non-medical: Not on file  Tobacco Use  . Smoking status: Current Every Day Smoker    Packs/day: 0.50    Years: 30.00    Pack years: 15.00    Types: Cigarettes  . Smokeless tobacco: Never Used  Substance and Sexual Activity  . Alcohol use: No    Alcohol/week: 0.0 standard drinks  . Drug use: No  . Sexual activity: Not on file  Lifestyle   . Physical activity:    Days per week: Not on file    Minutes per session: Not on file  . Stress: Not on file  Relationships  . Social connections:    Talks on phone: Not on file    Gets together: Not on file    Attends religious service: Not on file    Active member of club or organization: Not on file    Attends meetings of clubs or organizations: Not on file    Relationship status: Not on file  Other Topics Concern  . Not on file  Social History Narrative  . Not on file    Outpatient Encounter Medications as of 08/05/2018  Medication Sig  . cholecalciferol (VITAMIN D) 1000 units tablet Take 1,000 Units by mouth daily.  . folic acid (FOLVITE) 1 MG tablet Take 1 mg by mouth daily.  . metFORMIN (GLUCOPHAGE) 500 MG tablet Take 1 tablet (500 mg total) by mouth daily.  . methotrexate 2.5 MG tablet START WITH 6 TABLETS BY MOUTH ONCE A WEEK FOR FIRST WEEK THEN INCREASE TO 7 TABLETS ONCE A WEEK THEN 8 TABLETS WEEKLY  . [DISCONTINUED] predniSONE (DELTASONE) 20  MG tablet 40mg  daily for two weeks, then 30 mg daily for 2 weeks, then 20mg  daily for 1 week, then 10mg  daily thereafter   No facility-administered encounter medications on file as of 08/05/2018.     Review of Systems  Constitutional: Negative for appetite change and unexpected weight change.  HENT: Negative for congestion and sinus pressure.   Respiratory: Negative for cough, chest tightness and shortness of breath.   Cardiovascular: Negative for chest pain, palpitations and leg swelling.  Gastrointestinal: Negative for abdominal pain, diarrhea, nausea and vomiting.  Genitourinary: Negative for difficulty urinating and dysuria.  Musculoskeletal: Negative for joint swelling and myalgias.  Skin: Negative for color change and rash.  Neurological: Negative for dizziness, light-headedness and headaches.  Psychiatric/Behavioral: Negative for agitation and dysphoric mood.       Objective:    Physical Exam  Constitutional: She  appears well-developed and well-nourished. No distress.  HENT:  Nose: Nose normal.  Mouth/Throat: Oropharynx is clear and moist.  Neck: Neck supple. No thyromegaly present.  Cardiovascular: Normal rate and regular rhythm.  Pulmonary/Chest: Breath sounds normal. No respiratory distress. She has no wheezes.  Abdominal: Soft. Bowel sounds are normal. There is no tenderness.  Musculoskeletal: She exhibits no edema or tenderness.  Lymphadenopathy:    She has no cervical adenopathy.  Skin: No rash noted. No erythema.  Psychiatric: She has a normal mood and affect. Her behavior is normal.    BP 138/82 (BP Location: Left Arm, Patient Position: Sitting, Cuff Size: Normal)   Pulse 83   Temp 98.1 F (36.7 C) (Oral)   Resp 18   Wt 195 lb 9.6 oz (88.7 kg)   LMP 12/08/1997   SpO2 98%   BMI 29.74 kg/m  Wt Readings from Last 3 Encounters:  08/05/18 195 lb 9.6 oz (88.7 kg)  06/09/18 196 lb (88.9 kg)  09/10/17 191 lb 6.4 oz (86.8 kg)     Lab Results  Component Value Date   WBC 13.1 (H) 06/18/2018   HGB 15.4 (H) 06/18/2018   HCT 47.0 (H) 06/18/2018   PLT 202.0 06/18/2018   GLUCOSE 98 07/29/2018   CHOL 183 06/18/2018   TRIG 205.0 (H) 06/18/2018   HDL 56.50 06/18/2018   LDLDIRECT 110.0 06/18/2018   LDLCALC 130 (H) 09/10/2017   ALT 9 06/18/2018   AST 7 06/18/2018   NA 143 07/29/2018   K 3.7 07/29/2018   CL 107 07/29/2018   CREATININE 0.83 07/29/2018   BUN 7 07/29/2018   CO2 27 07/29/2018   TSH 3.32 06/18/2018   HGBA1C 7.5 (H) 07/29/2018    Mm 3d Screen Breast Bilateral  Result Date: 07/08/2018 CLINICAL DATA:  Screening. EXAM: DIGITAL SCREENING BILATERAL MAMMOGRAM WITH TOMO AND CAD COMPARISON:  Previous exam(s). ACR Breast Density Category b: There are scattered areas of fibroglandular density. FINDINGS: There are no findings suspicious for malignancy. Images were processed with CAD. IMPRESSION: No mammographic evidence of malignancy. A result letter of this screening mammogram  will be mailed directly to the patient. RECOMMENDATION: Screening mammogram in one year. (Code:SM-B-01Y) BI-RADS CATEGORY  1: Negative. Electronically Signed   By: Britta Mccreedy M.D.   On: 07/08/2018 12:26       Assessment & Plan:   Problem List Items Addressed This Visit    Diabetes mellitus without complication (HCC)    Low carb diet and exercise.  Off prednisone now.  Start metformin.  Follow met b and a1c.        Relevant Medications   metFORMIN (  GLUCOPHAGE) 500 MG tablet   Other Relevant Orders   Hemoglobin A1c   Microalbumin / creatinine urine ratio   Hypercholesterolemia    Low cholesterol diet and exercise.  Follow lipid panel.        Relevant Orders   Hepatic function panel   Lipid panel   Hypertension    Blood pressure as outlined.  Have her spot check her pressure. On no medication.  Follow metabolic panel.        Relevant Orders   Basic metabolic panel   Orbital mass    S/p orbitomy.  Followed by ophthalmology.  Off prednisone now.  Saw rheumatology.  On MTX.        Tobacco use    Discussed the need to quit smoking.        Vitamin D deficiency    Recent vitamin D level decreased.  On vitamin D supplements.         Other Visit Diagnoses    Leukocytosis, unspecified type    -  Primary   Relevant Orders   CBC with Differential/Platelet       Dale [place], MD

## 2018-08-10 ENCOUNTER — Encounter: Payer: Self-pay | Admitting: Internal Medicine

## 2018-08-10 DIAGNOSIS — E119 Type 2 diabetes mellitus without complications: Secondary | ICD-10-CM | POA: Insufficient documentation

## 2018-08-10 DIAGNOSIS — E1159 Type 2 diabetes mellitus with other circulatory complications: Secondary | ICD-10-CM | POA: Insufficient documentation

## 2018-08-10 NOTE — Assessment & Plan Note (Signed)
Low cholesterol diet and exercise.  Follow lipid panel.   

## 2018-08-10 NOTE — Assessment & Plan Note (Signed)
Recent vitamin D level decreased.  On vitamin D supplements.

## 2018-08-10 NOTE — Assessment & Plan Note (Signed)
Discussed the need to quit smoking.   

## 2018-08-10 NOTE — Assessment & Plan Note (Signed)
S/p orbitomy.  Followed by ophthalmology.  Off prednisone now.  Saw rheumatology.  On MTX.

## 2018-08-10 NOTE — Assessment & Plan Note (Signed)
Blood pressure as outlined.  Have her spot check her pressure. On no medication.  Follow metabolic panel.

## 2018-08-10 NOTE — Assessment & Plan Note (Signed)
Low carb diet and exercise.  Off prednisone now.  Start metformin.  Follow met b and a1c.

## 2018-08-19 DIAGNOSIS — Z79899 Other long term (current) drug therapy: Secondary | ICD-10-CM | POA: Diagnosis not present

## 2018-08-19 DIAGNOSIS — Z5181 Encounter for therapeutic drug level monitoring: Secondary | ICD-10-CM | POA: Diagnosis not present

## 2018-08-19 DIAGNOSIS — H0510 Unspecified chronic inflammatory disorders of orbit: Secondary | ICD-10-CM | POA: Diagnosis not present

## 2018-09-12 ENCOUNTER — Telehealth: Payer: Self-pay | Admitting: Internal Medicine

## 2018-09-12 NOTE — Telephone Encounter (Signed)
Copied from [place] [id number]. Topic: General - Other >> Sep 12, 2018  4:30 PM Wynetta Emery, [place] C wrote: Reason for CRM: pt would like to confirm receipt of forms faxed. Pt says that she faxed over current BP readings and also her Rheumatologist is requesting 4 labs to have completed at her ov with PCP.   Please advise pt if received.  [phone]

## 2018-09-16 NOTE — Telephone Encounter (Signed)
Pt is aware that we have received the orders and blood sugar readings. Advised to call back if needed

## 2018-10-31 ENCOUNTER — Other Ambulatory Visit (INDEPENDENT_AMBULATORY_CARE_PROVIDER_SITE_OTHER): Payer: 59

## 2018-10-31 DIAGNOSIS — I10 Essential (primary) hypertension: Secondary | ICD-10-CM

## 2018-10-31 DIAGNOSIS — E7800 Pure hypercholesterolemia, unspecified: Secondary | ICD-10-CM

## 2018-10-31 DIAGNOSIS — E119 Type 2 diabetes mellitus without complications: Secondary | ICD-10-CM | POA: Diagnosis not present

## 2018-10-31 DIAGNOSIS — D72829 Elevated white blood cell count, unspecified: Secondary | ICD-10-CM | POA: Diagnosis not present

## 2018-10-31 LAB — CBC WITH DIFFERENTIAL/PLATELET
BASOS PCT: 0.7 % (ref 0.0–3.0)
Basophils Absolute: 0.1 10*3/uL (ref 0.0–0.1)
EOS ABS: 0.1 10*3/uL (ref 0.0–0.7)
Eosinophils Relative: 1.1 % (ref 0.0–5.0)
HEMATOCRIT: 44.7 % (ref 36.0–46.0)
Hemoglobin: 15 g/dL (ref 12.0–15.0)
Lymphocytes Relative: 25.7 % (ref 12.0–46.0)
Lymphs Abs: 1.9 10*3/uL (ref 0.7–4.0)
MCHC: 33.6 g/dL (ref 30.0–36.0)
MCV: 96.2 fl (ref 78.0–100.0)
Monocytes Absolute: 0.7 10*3/uL (ref 0.1–1.0)
Monocytes Relative: 8.7 % (ref 3.0–12.0)
NEUTROS ABS: 4.8 10*3/uL (ref 1.4–7.7)
Neutrophils Relative %: 63.8 % (ref 43.0–77.0)
Platelets: 198 10*3/uL (ref 150.0–400.0)
RBC: 4.64 Mil/uL (ref 3.87–5.11)
RDW: 13.7 % (ref 11.5–15.5)
WBC: 7.6 10*3/uL (ref 4.0–10.5)

## 2018-10-31 LAB — HEPATIC FUNCTION PANEL
ALT: 14 U/L (ref 0–35)
AST: 14 U/L (ref 0–37)
Albumin: 4.2 g/dL (ref 3.5–5.2)
Alkaline Phosphatase: 81 U/L (ref 39–117)
BILIRUBIN DIRECT: 0.1 mg/dL (ref 0.0–0.3)
Total Bilirubin: 0.5 mg/dL (ref 0.2–1.2)
Total Protein: 6.4 g/dL (ref 6.0–8.3)

## 2018-10-31 LAB — BASIC METABOLIC PANEL
BUN: 8 mg/dL (ref 6–23)
CHLORIDE: 104 meq/L (ref 96–112)
CO2: 31 mEq/L (ref 19–32)
Calcium: 9.2 mg/dL (ref 8.4–10.5)
Creatinine, Ser: 0.79 mg/dL (ref 0.40–1.20)
GFR: 77.87 mL/min (ref >60.00)
Glucose, Bld: 100 mg/dL — ABNORMAL HIGH (ref 70–99)
POTASSIUM: 4 meq/L (ref 3.5–5.1)
Sodium: 142 mEq/L (ref 135–145)

## 2018-10-31 LAB — MICROALBUMIN / CREATININE URINE RATIO
CREATININE, U: 228.3 mg/dL
MICROALB/CREAT RATIO: 0.6 mg/g (ref 0.0–30.0)
Microalb, Ur: 1.5 mg/dL (ref 0.0–1.9)

## 2018-10-31 LAB — LIPID PANEL
Cholesterol: 177 mg/dL (ref 0–200)
HDL: 35 mg/dL — ABNORMAL LOW (ref >39.00)
LDL Cholesterol: 103 mg/dL — ABNORMAL HIGH (ref 0–99)
NonHDL: 141.56
TRIGLYCERIDES: 193 mg/dL — AB (ref 0.0–149.0)
Total CHOL/HDL Ratio: 5
VLDL: 38.6 mg/dL (ref 0.0–40.0)

## 2018-10-31 LAB — HEMOGLOBIN A1C: Hgb A1c MFr Bld: 6.1 % (ref 4.6–6.5)

## 2018-11-03 ENCOUNTER — Encounter: Payer: Self-pay | Admitting: Internal Medicine

## 2018-11-03 ENCOUNTER — Ambulatory Visit (INDEPENDENT_AMBULATORY_CARE_PROVIDER_SITE_OTHER): Payer: 59 | Admitting: Internal Medicine

## 2018-11-03 DIAGNOSIS — E7800 Pure hypercholesterolemia, unspecified: Secondary | ICD-10-CM

## 2018-11-03 DIAGNOSIS — E559 Vitamin D deficiency, unspecified: Secondary | ICD-10-CM

## 2018-11-03 DIAGNOSIS — E119 Type 2 diabetes mellitus without complications: Secondary | ICD-10-CM

## 2018-11-03 DIAGNOSIS — Z720 Tobacco use: Secondary | ICD-10-CM

## 2018-11-03 IMAGING — MG MM DIGITAL SCREENING BILAT W/ TOMO W/ CAD
8 series · 9 of 24 positions shown · non-contrast
Comparison: Previous exam(s).

CLINICAL DATA: Screening.

EXAM:
DIGITAL SCREENING BILATERAL MAMMOGRAM WITH TOMO AND CAD

[R CC synth-2D]
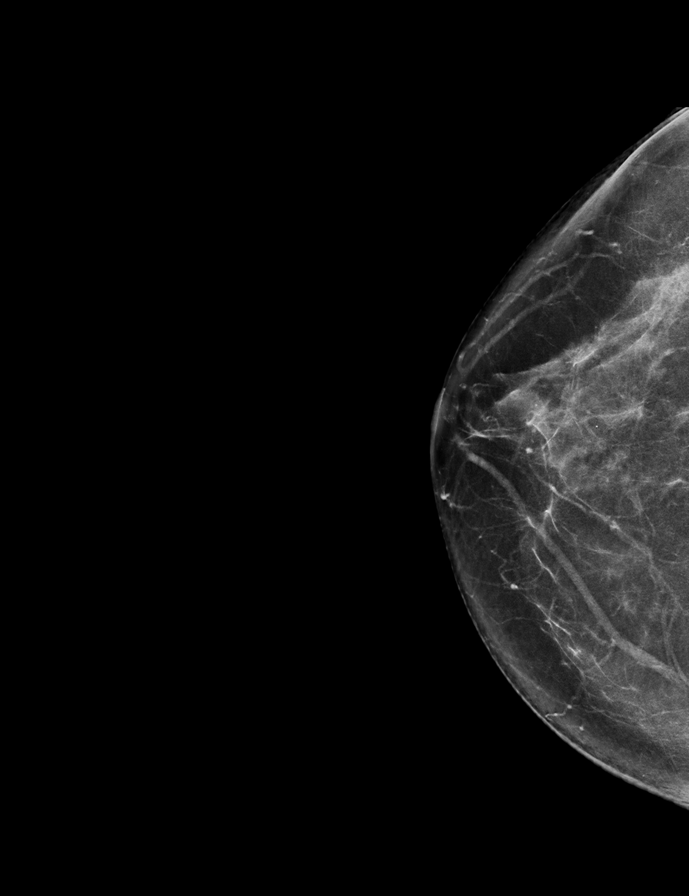

[R MLO synth-2D]
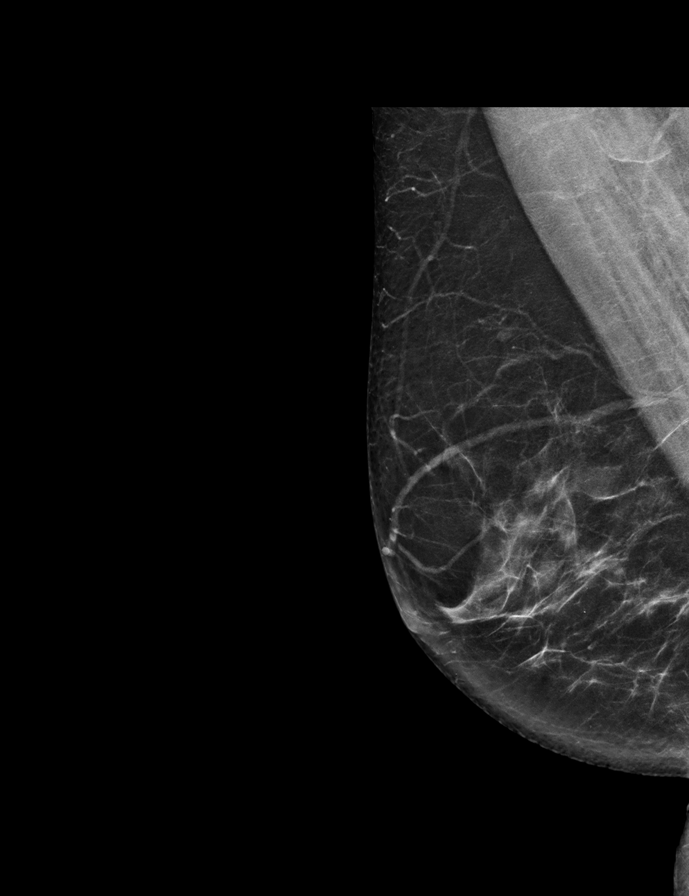

[L MLO synth-2D]
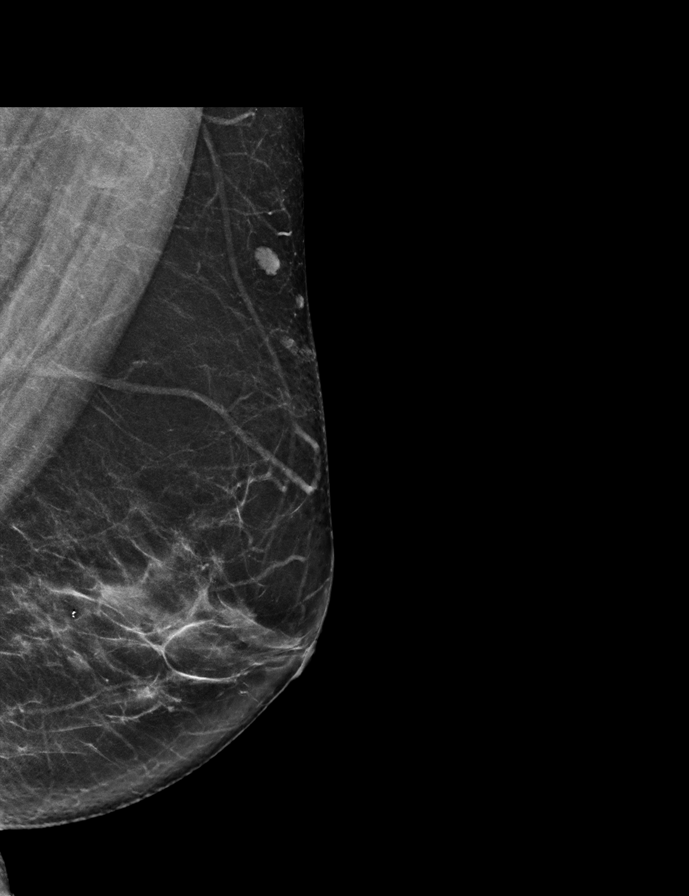

[L CC synth-2D]
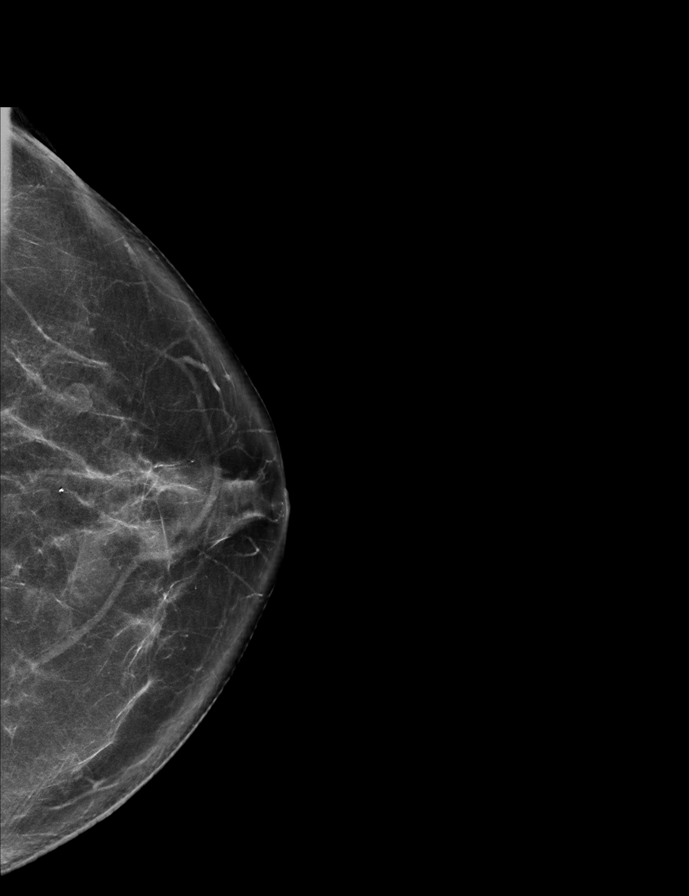

[L CC tomo · 2 of 73 frames shown]
[frame 24/73]
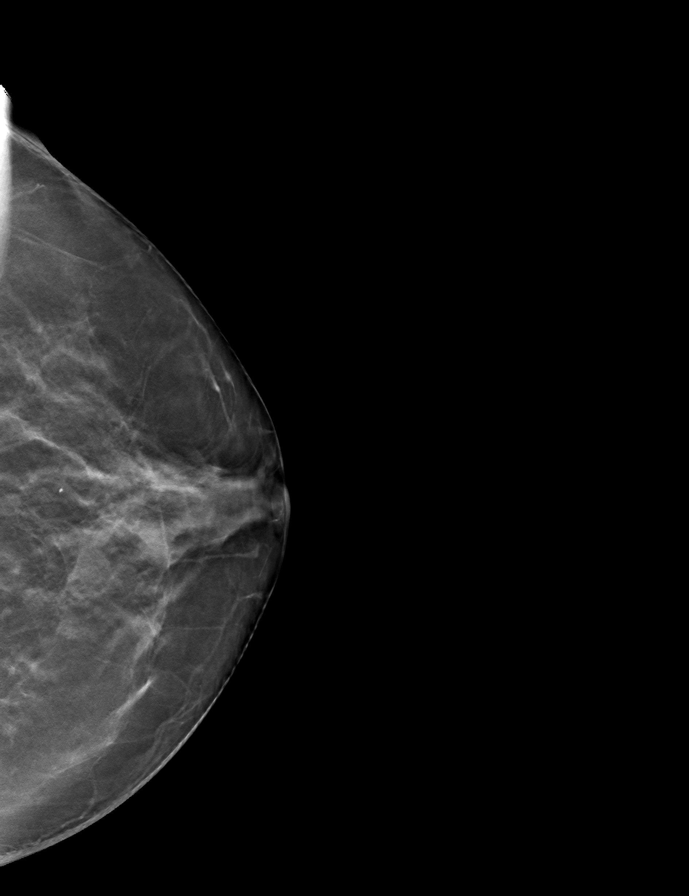
[frame 37/73]
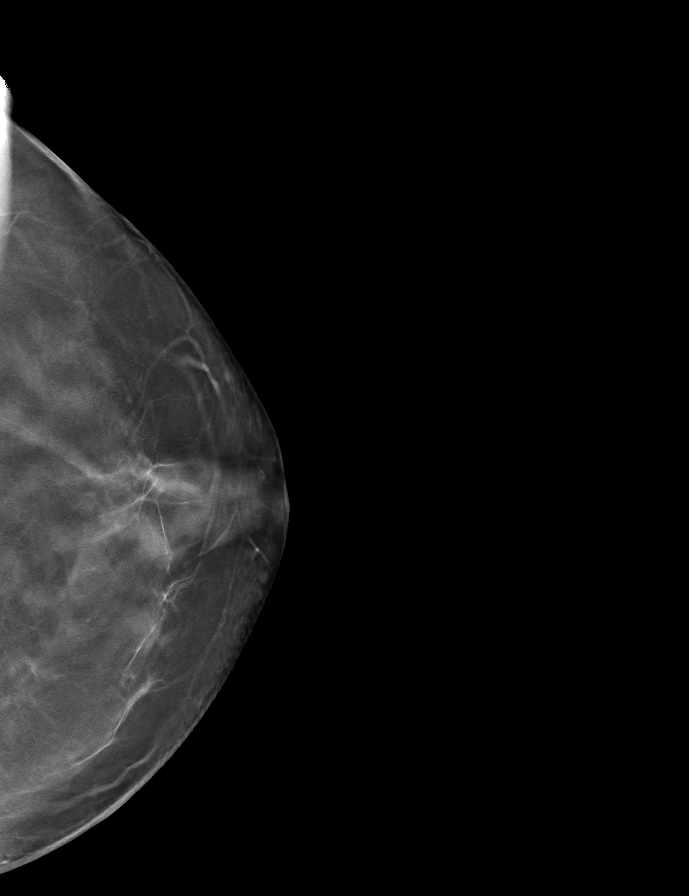

[R MLO tomo · tomo slice 37/74.0]
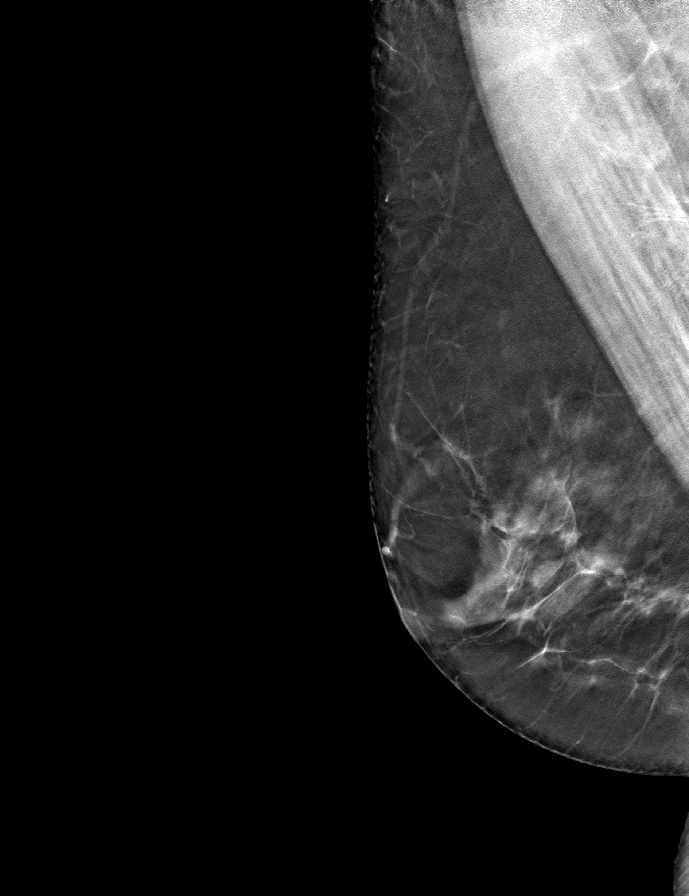

[L MLO tomo · tomo slice 35/69.0]
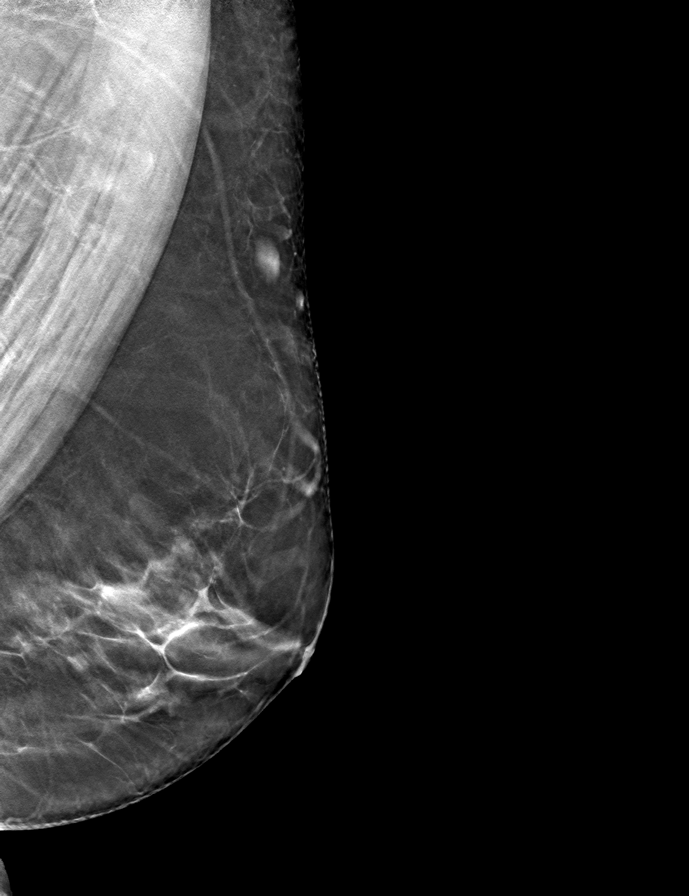

[R CC tomo · tomo slice 37/74.0]
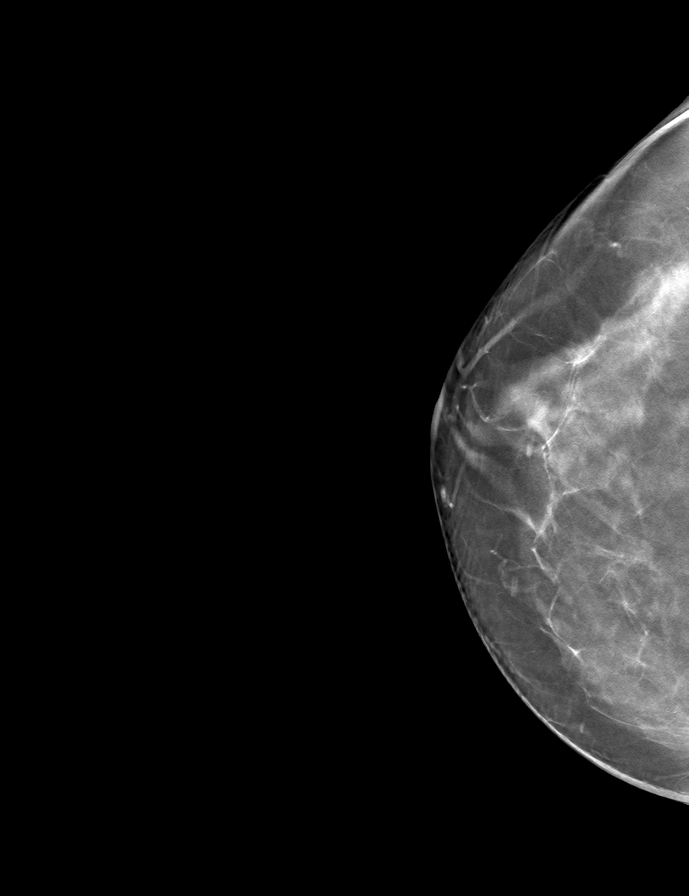

[9 of 24 positions shown; findings below may reference images not displayed]

ACR Breast Density Category b: There are scattered areas of
fibroglandular density.
FINDINGS: There are no findings suspicious for malignancy. Images were
processed with CAD.
IMPRESSION: No mammographic evidence of malignancy. A result letter of this
screening mammogram will be mailed directly to the patient.

RECOMMENDATION:
Screening mammogram in one year. (Code:CN-U-775)

BI-RADS CATEGORY  1: Negative.

## 2018-11-03 NOTE — Progress Notes (Signed)
Patient ID: Carrie Wilson, female   DOB: 11/13/1955, 62 y.o.   MRN: [id number]   Subjective:    Patient ID: Carrie Wilson, female    DOB: 05-25-1956, 62 y.o.   MRN: [id number]  HPI  Patient here for a scheduled follow up.  She is being followed by rheumatology for idiopathic orbital inflammatory syndrome.  On MTX.  Off prednisone.  Stable. Recommended f/u in 01/2019.  She has adjusted her diet.  Lost weight.  Feels good.  Discussed continued diet and exercise.  No chest pain.  No sob.  No acid reflux.  No abdominal pain.  Bowels moving.  Sugars better.  Discussed recent labs.  a1c 6.1.     Past Medical History:  Diagnosis Date  . GERD (gastroesophageal reflux disease)    neg H. pylori  . Hyperlipidemia   . Hypertension   . Vitamin D deficiency    Past Surgical History:  Procedure Laterality Date  . CHOLECYSTECTOMY  1994  . rectal fissure repair  2002  . TUBAL LIGATION  1995   Family History  Problem Relation Age of Onset  . Hypertension Mother   . Heart disease Mother   . Hypercholesterolemia Brother   . Colon cancer Neg Hx   . Breast cancer Neg Hx    Social History   Socioeconomic History  . Marital status: Married    Spouse name: Not on file  . Number of children: 1  . Years of education: Not on file  . Highest education level: Not on file  Occupational History  . Not on file  Social Needs  . Financial resource strain: Not on file  . Food insecurity:    Worry: Not on file    Inability: Not on file  . Transportation needs:    Medical: Not on file    Non-medical: Not on file  Tobacco Use  . Smoking status: Current Every Day Smoker    Packs/day: 0.50    Years: 30.00    Pack years: 15.00    Types: Cigarettes  . Smokeless tobacco: Never Used  Substance and Sexual Activity  . Alcohol use: No    Alcohol/week: 0.0 standard drinks  . Drug use: No  . Sexual activity: Not on file  Lifestyle  . Physical activity:    Days per week: Not on file   Minutes per session: Not on file  . Stress: Not on file  Relationships  . Social connections:    Talks on phone: Not on file    Gets together: Not on file    Attends religious service: Not on file    Active member of club or organization: Not on file    Attends meetings of clubs or organizations: Not on file    Relationship status: Not on file  Other Topics Concern  . Not on file  Social History Narrative  . Not on file    Outpatient Encounter Medications as of 11/03/2018  Medication Sig  . cholecalciferol (VITAMIN D) 1000 units tablet Take 1,000 Units by mouth daily.  . folic acid (FOLVITE) 1 MG tablet Take 1 mg by mouth daily.  . metFORMIN (GLUCOPHAGE) 500 MG tablet Take 1 tablet (500 mg total) by mouth daily.  . methotrexate 2.5 MG tablet START WITH 6 TABLETS BY MOUTH ONCE A WEEK FOR FIRST WEEK THEN INCREASE TO 7 TABLETS ONCE A WEEK THEN 8 TABLETS WEEKLY   No facility-administered encounter medications on file as of 11/03/2018.     Review  of Systems  Constitutional: Negative for appetite change.       Has adjusted diet.  Lost weight.    HENT: Negative for congestion and sinus pressure.   Respiratory: Negative for cough, chest tightness and shortness of breath.   Cardiovascular: Negative for chest pain, palpitations and leg swelling.  Gastrointestinal: Negative for abdominal pain, diarrhea, nausea and vomiting.  Genitourinary: Negative for difficulty urinating and dysuria.  Musculoskeletal: Negative for joint swelling and myalgias.  Skin: Negative for color change and rash.  Neurological: Negative for dizziness, light-headedness and headaches.  Psychiatric/Behavioral: Negative for agitation and dysphoric mood.       Objective:    Physical Exam Constitutional:      General: She is not in acute distress.    Appearance: Normal appearance.  HENT:     Nose: Nose normal. No congestion.     Mouth/Throat:     Pharynx: No oropharyngeal exudate or posterior oropharyngeal  erythema.  Neck:     Musculoskeletal: Neck supple. No muscular tenderness.     Thyroid: No thyromegaly.  Cardiovascular:     Rate and Rhythm: Normal rate and regular rhythm.  Pulmonary:     Effort: No respiratory distress.     Breath sounds: Normal breath sounds. No wheezing.  Abdominal:     General: Bowel sounds are normal.     Palpations: Abdomen is soft.     Tenderness: There is no abdominal tenderness.  Musculoskeletal:        General: No swelling or tenderness.  Lymphadenopathy:     Cervical: No cervical adenopathy.  Skin:    Findings: No erythema or rash.  Neurological:     Mental Status: She is alert.  Psychiatric:        Mood and Affect: Mood normal.        Thought Content: Thought content normal.     BP 118/62 (BP Location: Left Arm, Patient Position: Sitting, Cuff Size: Large)   Pulse 96   Temp 97.7 F (36.5 C)   Ht 5\' 8"  (1.727 m)   Wt 185 lb 9.6 oz (84.2 kg)   LMP 12/08/1997   SpO2 98%   BMI 28.22 kg/m  Wt Readings from Last 3 Encounters:  11/03/18 185 lb 9.6 oz (84.2 kg)  08/05/18 195 lb 9.6 oz (88.7 kg)  06/09/18 196 lb (88.9 kg)     Lab Results  Component Value Date   WBC 7.6 10/31/2018   HGB 15.0 10/31/2018   HCT 44.7 10/31/2018   PLT 198.0 10/31/2018   GLUCOSE 100 (H) 10/31/2018   CHOL 177 10/31/2018   TRIG 193.0 (H) 10/31/2018   HDL 35.00 (L) 10/31/2018   LDLDIRECT 110.0 06/18/2018   LDLCALC 103 (H) 10/31/2018   ALT 14 10/31/2018   AST 14 10/31/2018   NA 142 10/31/2018   K 4.0 10/31/2018   CL 104 10/31/2018   CREATININE 0.79 10/31/2018   BUN 8 10/31/2018   CO2 31 10/31/2018   TSH 3.32 06/18/2018   HGBA1C 6.1 10/31/2018   MICROALBUR 1.5 10/31/2018    Mm 3d Screen Breast Bilateral  Result Date: 07/08/2018 CLINICAL DATA:  Screening. EXAM: DIGITAL SCREENING BILATERAL MAMMOGRAM WITH TOMO AND CAD COMPARISON:  Previous exam(s). ACR Breast Density Category b: There are scattered areas of fibroglandular density. FINDINGS: There are no  findings suspicious for malignancy. Images were processed with CAD. IMPRESSION: No mammographic evidence of malignancy. A result letter of this screening mammogram will be mailed directly to the patient. RECOMMENDATION: Screening mammogram in  one year. (Code:SM-B-01Y) BI-RADS CATEGORY  1: Negative. Electronically Signed   By: Britta Mccreedy M.D.   On: 07/08/2018 12:26       Assessment & Plan:   Problem List Items Addressed This Visit    Diabetes mellitus without complication (HCC)    Has adjusted diet.  Lost weight.  Feels better.  Sugars better.  a1c 6.1.  Follow met b and a1c.        Relevant Orders   Hemoglobin A1c   Basic metabolic panel   Hypercholesterolemia    Low cholesterol diet and exercise.  Follow lipid panel.      Relevant Orders   Hepatic function panel   Lipid panel   Tobacco use    Discussed the need to quit smoking.  Declines to quit.  Follow.        Vitamin D deficiency    Follow vitamin D level.  Continue vitamin D supplements.            Dale [place], MD

## 2018-11-09 ENCOUNTER — Encounter: Payer: Self-pay | Admitting: Internal Medicine

## 2018-11-09 NOTE — Assessment & Plan Note (Signed)
Follow vitamin D level.  Continue vitamin D supplements.

## 2018-11-09 NOTE — Assessment & Plan Note (Signed)
Low cholesterol diet and exercise.  Follow lipid panel.   

## 2018-11-09 NOTE — Assessment & Plan Note (Signed)
Has adjusted diet.  Lost weight.  Feels better.  Sugars better.  a1c 6.1.  Follow met b and a1c.

## 2018-11-09 NOTE — Assessment & Plan Note (Signed)
Discussed the need to quit smoking.  Declines to quit.  Follow.   

## 2018-12-15 ENCOUNTER — Encounter: Payer: Self-pay | Admitting: Internal Medicine

## 2018-12-15 ENCOUNTER — Ambulatory Visit: Payer: 59 | Admitting: Internal Medicine

## 2018-12-15 DIAGNOSIS — E119 Type 2 diabetes mellitus without complications: Secondary | ICD-10-CM | POA: Diagnosis not present

## 2018-12-15 DIAGNOSIS — E7800 Pure hypercholesterolemia, unspecified: Secondary | ICD-10-CM | POA: Diagnosis not present

## 2018-12-15 DIAGNOSIS — H0589 Other disorders of orbit: Secondary | ICD-10-CM

## 2018-12-15 DIAGNOSIS — Z720 Tobacco use: Secondary | ICD-10-CM | POA: Diagnosis not present

## 2018-12-15 DIAGNOSIS — E559 Vitamin D deficiency, unspecified: Secondary | ICD-10-CM | POA: Diagnosis not present

## 2018-12-15 NOTE — Assessment & Plan Note (Signed)
Discussed the need to quit smoking.  She declines to quit at this time.  Did not qualify for screening CT.

## 2018-12-15 NOTE — Assessment & Plan Note (Signed)
S/p orbitomy.  Followed by ophthalmology and rheumatology.  On MTX.  Has f/u planned next month.  Stable.

## 2018-12-15 NOTE — Assessment & Plan Note (Signed)
Has adjusted her diet.  Lost weight.  Follow met b and a1c.  Of prednisone.

## 2018-12-15 NOTE — Assessment & Plan Note (Signed)
Follow vitamin D level.  

## 2018-12-15 NOTE — Progress Notes (Signed)
Patient ID: Carrie Wilson, female   DOB: 01/11/1956, 63 y.o.   MRN: [id number]   Subjective:    Patient ID: Carrie Wilson, female    DOB: [birth year], 63 y.o.   MRN: [id number]  HPI  Patient here for a scheduled follow up.  Being followed by rheumatology for idiopathic orbital inflammatory syndrome.  On MTX.  Stable.  Recommended f/u in 01/2019.  She feels things are stable.  Overall she feels she is doing relatively well.  No chest pain.  No sob.  No acid reflux.  No abdominal pain.  Bowels moving.  No urine change.  Not checking sugars now.  Still watching her diet.  Has lost weight.  Taste has changed some.  She reports started with starting the MTX.  Mainly just affects eating beef.  States still has a good appetite and is eating regular meals.  Still smoking.  Discussed the need to quit.     Past Medical History:  Diagnosis Date  . GERD (gastroesophageal reflux disease)    neg H. pylori  . Hyperlipidemia   . Hypertension   . Vitamin D deficiency    Past Surgical History:  Procedure Laterality Date  . CHOLECYSTECTOMY  1994  . rectal fissure repair  2002  . TUBAL LIGATION  1995   Family History  Problem Relation Age of Onset  . Hypertension Mother   . Heart disease Mother   . Hypercholesterolemia Brother   . Colon cancer Neg Hx   . Breast cancer Neg Hx    Social History   Socioeconomic History  . Marital status: Married    Spouse name: Not on file  . Number of children: 1  . Years of education: Not on file  . Highest education level: Not on file  Occupational History  . Not on file  Social Needs  . Financial resource strain: Not on file  . Food insecurity:    Worry: Not on file    Inability: Not on file  . Transportation needs:    Medical: Not on file    Non-medical: Not on file  Tobacco Use  . Smoking status: Current Every Day Smoker    Packs/day: 0.50    Years: 30.00    Pack years: 15.00    Types: Cigarettes  . Smokeless tobacco: Never Used   Substance and Sexual Activity  . Alcohol use: No    Alcohol/week: 0.0 standard drinks  . Drug use: No  . Sexual activity: Not on file  Lifestyle  . Physical activity:    Days per week: Not on file    Minutes per session: Not on file  . Stress: Not on file  Relationships  . Social connections:    Talks on phone: Not on file    Gets together: Not on file    Attends religious service: Not on file    Active member of club or organization: Not on file    Attends meetings of clubs or organizations: Not on file    Relationship status: Not on file  Other Topics Concern  . Not on file  Social History Narrative  . Not on file    Outpatient Encounter Medications as of 12/15/2018  Medication Sig  . cholecalciferol (VITAMIN D) 1000 units tablet Take 1,000 Units by mouth daily.  . folic acid (FOLVITE) 1 MG tablet Take 1 mg by mouth daily.  . methotrexate 2.5 MG tablet START WITH 6 TABLETS BY MOUTH ONCE A WEEK FOR FIRST WEEK THEN  INCREASE TO 7 TABLETS ONCE A WEEK THEN 8 TABLETS WEEKLY  . [DISCONTINUED] metFORMIN (GLUCOPHAGE) 500 MG tablet Take 1 tablet (500 mg total) by mouth daily.   No facility-administered encounter medications on file as of 12/15/2018.     Review of Systems  Constitutional: Negative for appetite change and unexpected weight change.       Has adjusted her diet and lost weight.    HENT: Negative for congestion and sinus pressure.   Respiratory: Negative for cough, chest tightness and shortness of breath.   Cardiovascular: Negative for chest pain, palpitations and leg swelling.  Gastrointestinal: Negative for abdominal pain, diarrhea, nausea and vomiting.  Genitourinary: Negative for difficulty urinating and dysuria.  Musculoskeletal: Negative for joint swelling and myalgias.  Skin: Negative for color change and rash.  Neurological: Negative for dizziness, light-headedness and headaches.  Psychiatric/Behavioral: Negative for agitation and dysphoric mood.         Objective:    Physical Exam Constitutional:      General: She is not in acute distress.    Appearance: Normal appearance.  HENT:     Nose: Nose normal. No congestion.     Mouth/Throat:     Pharynx: No oropharyngeal exudate or posterior oropharyngeal erythema.  Neck:     Musculoskeletal: Neck supple. No muscular tenderness.     Thyroid: No thyromegaly.  Cardiovascular:     Rate and Rhythm: Normal rate and regular rhythm.  Pulmonary:     Effort: No respiratory distress.     Breath sounds: Normal breath sounds. No wheezing.  Abdominal:     General: Bowel sounds are normal.     Palpations: Abdomen is soft.     Tenderness: There is no abdominal tenderness.  Musculoskeletal:        General: No swelling or tenderness.  Lymphadenopathy:     Cervical: No cervical adenopathy.  Skin:    Findings: No erythema or rash.  Neurological:     Mental Status: She is alert.  Psychiatric:        Mood and Affect: Mood normal.        Behavior: Behavior normal.     BP 118/78 (BP Location: Left Arm, Patient Position: Sitting, Cuff Size: Normal)   Pulse 85   Temp 97.9 F (36.6 C) (Oral)   Resp 16   Wt 181 lb 12.8 oz (82.5 kg)   LMP 12/08/1997   SpO2 96%   BMI 27.64 kg/m  Wt Readings from Last 3 Encounters:  12/15/18 181 lb 12.8 oz (82.5 kg)  11/03/18 185 lb 9.6 oz (84.2 kg)  08/05/18 195 lb 9.6 oz (88.7 kg)     Lab Results  Component Value Date   WBC 7.6 10/31/2018   HGB 15.0 10/31/2018   HCT 44.7 10/31/2018   PLT 198.0 10/31/2018   GLUCOSE 100 (H) 10/31/2018   CHOL 177 10/31/2018   TRIG 193.0 (H) 10/31/2018   HDL 35.00 (L) 10/31/2018   LDLDIRECT 110.0 06/18/2018   LDLCALC 103 (H) 10/31/2018   ALT 14 10/31/2018   AST 14 10/31/2018   NA 142 10/31/2018   K 4.0 10/31/2018   CL 104 10/31/2018   CREATININE 0.79 10/31/2018   BUN 8 10/31/2018   CO2 31 10/31/2018   TSH 3.32 06/18/2018   HGBA1C 6.1 10/31/2018   MICROALBUR 1.5 10/31/2018    Mm 3d Screen Breast  Bilateral  Result Date: 07/08/2018 CLINICAL DATA:  Screening. EXAM: DIGITAL SCREENING BILATERAL MAMMOGRAM WITH TOMO AND CAD COMPARISON:  Previous exam(s). ACR  Breast Density Category b: There are scattered areas of fibroglandular density. FINDINGS: There are no findings suspicious for malignancy. Images were processed with CAD. IMPRESSION: No mammographic evidence of malignancy. A result letter of this screening mammogram will be mailed directly to the patient. RECOMMENDATION: Screening mammogram in one year. (Code:SM-B-01Y) BI-RADS CATEGORY  1: Negative. Electronically Signed   By: Britta Mccreedy M.D.   On: 07/08/2018 12:26       Assessment & Plan:   Problem List Items Addressed This Visit    Diabetes mellitus without complication (HCC)    Has adjusted her diet.  Lost weight.  Follow met b and a1c.  Of prednisone.        Hypercholesterolemia    Low cholesterol diet and exercise.  Follow lipid panel.       Orbital mass    S/p orbitomy.  Followed by ophthalmology and rheumatology.  On MTX.  Has f/u planned next month.  Stable.        Tobacco use    Discussed the need to quit smoking.  She declines to quit at this time.  Did not qualify for screening CT.        Vitamin D deficiency    Follow vitamin D level.            Dale [place], MD

## 2018-12-15 NOTE — Assessment & Plan Note (Signed)
Low cholesterol diet and exercise.  Follow lipid panel.   

## 2019-01-12 DIAGNOSIS — Z8619 Personal history of other infectious and parasitic diseases: Secondary | ICD-10-CM | POA: Diagnosis not present

## 2019-01-12 DIAGNOSIS — Z8709 Personal history of other diseases of the respiratory system: Secondary | ICD-10-CM | POA: Diagnosis not present

## 2019-01-12 DIAGNOSIS — H0510 Unspecified chronic inflammatory disorders of orbit: Secondary | ICD-10-CM | POA: Diagnosis not present

## 2019-03-18 ENCOUNTER — Other Ambulatory Visit: Payer: Self-pay

## 2019-03-18 ENCOUNTER — Other Ambulatory Visit (INDEPENDENT_AMBULATORY_CARE_PROVIDER_SITE_OTHER): Payer: 59

## 2019-03-18 DIAGNOSIS — E119 Type 2 diabetes mellitus without complications: Secondary | ICD-10-CM

## 2019-03-18 DIAGNOSIS — E7800 Pure hypercholesterolemia, unspecified: Secondary | ICD-10-CM

## 2019-03-18 LAB — BASIC METABOLIC PANEL
BUN: 8 mg/dL (ref 6–23)
CO2: 29 mEq/L (ref 19–32)
Calcium: 9 mg/dL (ref 8.4–10.5)
Chloride: 104 mEq/L (ref 96–112)
Creatinine, Ser: 0.84 mg/dL (ref 0.40–1.20)
GFR: 68.18 mL/min (ref >60.00)
Glucose, Bld: 95 mg/dL (ref 70–99)
Potassium: 4 mEq/L (ref 3.5–5.1)
Sodium: 141 mEq/L (ref 135–145)

## 2019-03-18 LAB — LIPID PANEL
Cholesterol: 182 mg/dL (ref 0–200)
HDL: 33.2 mg/dL — ABNORMAL LOW (ref >39.00)
LDL Cholesterol: 116 mg/dL — ABNORMAL HIGH (ref 0–99)
NonHDL: 148.76
Total CHOL/HDL Ratio: 5
Triglycerides: 165 mg/dL — ABNORMAL HIGH (ref 0.0–149.0)
VLDL: 33 mg/dL (ref 0.0–40.0)

## 2019-03-18 LAB — HEPATIC FUNCTION PANEL
ALT: 11 U/L (ref 0–35)
AST: 13 U/L (ref 0–37)
Albumin: 4.2 g/dL (ref 3.5–5.2)
Alkaline Phosphatase: 85 U/L (ref 39–117)
Bilirubin, Direct: 0.1 mg/dL (ref 0.0–0.3)
Total Bilirubin: 0.8 mg/dL (ref 0.2–1.2)
Total Protein: 6.7 g/dL (ref 6.0–8.3)

## 2019-03-18 LAB — HEMOGLOBIN A1C: Hgb A1c MFr Bld: 6.2 % (ref 4.6–6.5)

## 2019-03-20 ENCOUNTER — Encounter: Payer: Self-pay | Admitting: Internal Medicine

## 2019-03-20 ENCOUNTER — Other Ambulatory Visit: Payer: Self-pay

## 2019-03-20 ENCOUNTER — Ambulatory Visit (INDEPENDENT_AMBULATORY_CARE_PROVIDER_SITE_OTHER): Payer: 59 | Admitting: Internal Medicine

## 2019-03-20 DIAGNOSIS — E119 Type 2 diabetes mellitus without complications: Secondary | ICD-10-CM

## 2019-03-20 DIAGNOSIS — E559 Vitamin D deficiency, unspecified: Secondary | ICD-10-CM

## 2019-03-20 DIAGNOSIS — E7800 Pure hypercholesterolemia, unspecified: Secondary | ICD-10-CM | POA: Diagnosis not present

## 2019-03-20 DIAGNOSIS — Z720 Tobacco use: Secondary | ICD-10-CM | POA: Diagnosis not present

## 2019-03-20 DIAGNOSIS — H0510 Unspecified chronic inflammatory disorders of orbit: Secondary | ICD-10-CM

## 2019-03-20 NOTE — Progress Notes (Signed)
Patient ID: Carrie Wilson, female   DOB: Mar 13, 1956, 63 y.o.   MRN: [id number]   Virtual Visit via Telephone Note  This visit type was conducted due to national recommendations for restrictions regarding the COVID-19 pandemic (e.g. social distancing).  This format is felt to be most appropriate for this patient at this time.  All issues noted in this document were discussed and addressed.  No physical exam was performed (except for noted visual exam findings with Video Visits).   I connected with Yates Decamp by telephone and verified that I am speaking with the correct person using two identifiers. Location patient: home Location provider: work Persons participating in the telephone visit: patient, provider  I discussed the limitations, risks, security and privacy concerns of performing an evaluation and management service by telephone and the availability of in person appointments.  The patient expressed understanding and agreed to proceed.   Reason for visit: scheduled follow up.   HPI: She reports she is doing relatively well.  Being followed by rheumatology for idiopathic orbital inflammatory syndrome.  On MTX.  Tolerating.  Last evaluated 01/12/19.  Stable.  Recommended f/u in 6 months.  Has f/u appt 08/09/19.  She is staying physically active.  No chest pain.  No sob.  No fever, cough or congestion.  No known COVID exposure.  No acid reflux.  No abdominal pain.  Bowels moving.  Discussed labs. a1c 6.2.  Discussed calculated cholesterol risk and recommendation to start cholesterol medication.  She declines.  Wants to continue diet and exercise.  Discussed overdue colonoscopy.  She is agreeable.     ROS: See pertinent positives and negatives per HPI.  Past Medical History:  Diagnosis Date  . GERD (gastroesophageal reflux disease)    neg H. pylori  . Hyperlipidemia   . Hypertension   . Vitamin D deficiency     Past Surgical History:  Procedure Laterality Date  .  CHOLECYSTECTOMY  1994  . rectal fissure repair  2002  . TUBAL LIGATION  1995    Family History  Problem Relation Age of Onset  . Hypertension Mother   . Heart disease Mother   . Hypercholesterolemia Brother   . Colon cancer Neg Hx   . Breast cancer Neg Hx     SOCIAL HX: reviewed.    Current Outpatient Medications:  .  cholecalciferol (VITAMIN D) 1000 units tablet, Take 1,000 Units by mouth daily., Disp: , Rfl:  .  folic acid (FOLVITE) 1 MG tablet, Take 1 mg by mouth daily., Disp: , Rfl: 3 .  methotrexate 2.5 MG tablet, START WITH 6 TABLETS BY MOUTH ONCE A WEEK FOR FIRST WEEK THEN INCREASE TO 7 TABLETS ONCE A WEEK THEN 8 TABLETS WEEKLY, Disp: , Rfl: 0  EXAM:  VITALS per patient if applicable: weight 175 pounds, blood pressure 128/70  PSYCH/NEURO: pleasant and cooperative, no obvious depression or anxiety, speech and thought processing grossly intact  ASSESSMENT AND PLAN:  Discussed the following assessment and plan:  Diabetes mellitus without complication (HCC) - Plan: TSH, Hemoglobin A1c, Basic metabolic panel  Hypercholesterolemia - Plan: Lipid panel, Hepatic function panel  Tobacco use  Vitamin D deficiency  Idiopathic orbital inflammatory syndrome  Diabetes mellitus without complication (HCC) She has adjusted her diet.  Lost weight.  Feels better.  Low carb diet and exercise.  Follow met b and a1c.    Hypercholesterolemia Discussed calculated cholesterol risk.  Discussed recommendation to start a cholesterol medication.  She declines.  Low cholesterol diet and  exercise.  Follow lipid panel.    Tobacco use Discussed the need to quit smoking.  She declines.  Follow.    Vitamin D deficiency Follow vitamin D level.   Idiopathic orbital inflammatory syndrome On MTX.  Followed by rheumatology.      I discussed the assessment and treatment plan with the patient. The patient was provided an opportunity to ask questions and all were answered. The patient agreed  with the plan and demonstrated an understanding of the instructions.   The patient was advised to call back or seek an in-person evaluation if the symptoms worsen or if the condition fails to improve as anticipated.  I provided 15 minutes of non-face-to-face time during this encounter.   Dale [place], MD

## 2019-03-22 ENCOUNTER — Encounter: Payer: Self-pay | Admitting: Internal Medicine

## 2019-03-22 DIAGNOSIS — H0510 Unspecified chronic inflammatory disorders of orbit: Secondary | ICD-10-CM | POA: Insufficient documentation

## 2019-03-22 NOTE — Assessment & Plan Note (Signed)
Follow vitamin D level.  

## 2019-03-22 NOTE — Assessment & Plan Note (Signed)
On MTX.  Followed by rheumatology.  

## 2019-03-22 NOTE — Assessment & Plan Note (Signed)
Discussed calculated cholesterol risk.  Discussed recommendation to start a cholesterol medication.  She declines.  Low cholesterol diet and exercise.  Follow lipid panel.

## 2019-03-22 NOTE — Assessment & Plan Note (Signed)
She has adjusted her diet.  Lost weight.  Feels better.  Low carb diet and exercise.  Follow met b and a1c.

## 2019-03-22 NOTE — Assessment & Plan Note (Signed)
Discussed the need to quit smoking.  She declines.  Follow.  

## 2019-03-23 ENCOUNTER — Ambulatory Visit: Payer: 59 | Admitting: Internal Medicine

## 2019-06-03 ENCOUNTER — Other Ambulatory Visit: Payer: Self-pay | Admitting: Internal Medicine

## 2019-06-03 DIAGNOSIS — Z1231 Encounter for screening mammogram for malignant neoplasm of breast: Secondary | ICD-10-CM

## 2019-07-02 ENCOUNTER — Telehealth: Payer: Self-pay | Admitting: Internal Medicine

## 2019-07-02 DIAGNOSIS — Z5181 Encounter for therapeutic drug level monitoring: Secondary | ICD-10-CM | POA: Diagnosis not present

## 2019-07-02 DIAGNOSIS — Z79899 Other long term (current) drug therapy: Secondary | ICD-10-CM | POA: Diagnosis not present

## 2019-07-02 NOTE — Telephone Encounter (Signed)
Faxed and patient aware. 

## 2019-07-02 NOTE — Telephone Encounter (Signed)
Pt called and states that she needs all lab work from 03/2019 faxed over to [facility]/ Dr Sheppard Penton so that she can get medication refilled.   Fax#[phone]   Pt would like a call when faxed.

## 2019-07-14 ENCOUNTER — Ambulatory Visit
Admission: RE | Admit: 2019-07-14 | Discharge: 2019-07-14 | Disposition: A | Discharge status: Home / Self Care | Payer: BC Managed Care – PPO | Source: Ambulatory Visit | Attending: Internal Medicine | Admitting: Internal Medicine

## 2019-07-14 DIAGNOSIS — Z1231 Encounter for screening mammogram for malignant neoplasm of breast: Secondary | ICD-10-CM | POA: Diagnosis not present

## 2019-07-27 DIAGNOSIS — E559 Vitamin D deficiency, unspecified: Secondary | ICD-10-CM | POA: Diagnosis not present

## 2019-07-27 DIAGNOSIS — H0510 Unspecified chronic inflammatory disorders of orbit: Secondary | ICD-10-CM | POA: Diagnosis not present

## 2019-07-27 DIAGNOSIS — Z5181 Encounter for therapeutic drug level monitoring: Secondary | ICD-10-CM | POA: Diagnosis not present

## 2019-07-27 DIAGNOSIS — Z79899 Other long term (current) drug therapy: Secondary | ICD-10-CM | POA: Diagnosis not present

## 2019-08-06 ENCOUNTER — Encounter: Payer: 59 | Admitting: Internal Medicine

## 2019-08-13 ENCOUNTER — Other Ambulatory Visit: Payer: Self-pay

## 2019-08-13 ENCOUNTER — Other Ambulatory Visit (INDEPENDENT_AMBULATORY_CARE_PROVIDER_SITE_OTHER): Payer: BC Managed Care – PPO

## 2019-08-13 DIAGNOSIS — E7800 Pure hypercholesterolemia, unspecified: Secondary | ICD-10-CM | POA: Diagnosis not present

## 2019-08-13 DIAGNOSIS — E119 Type 2 diabetes mellitus without complications: Secondary | ICD-10-CM | POA: Diagnosis not present

## 2019-08-13 LAB — HEMOGLOBIN A1C: Hgb A1c MFr Bld: 6.4 % (ref 4.6–6.5)

## 2019-08-13 LAB — BASIC METABOLIC PANEL
BUN: 9 mg/dL (ref 6–23)
CO2: 29 mEq/L (ref 19–32)
Calcium: 9.3 mg/dL (ref 8.4–10.5)
Chloride: 105 mEq/L (ref 96–112)
Creatinine, Ser: 0.79 mg/dL (ref 0.40–1.20)
GFR: 73.09 mL/min (ref >60.00)
Glucose, Bld: 97 mg/dL (ref 70–99)
Potassium: 4.2 mEq/L (ref 3.5–5.1)
Sodium: 143 mEq/L (ref 135–145)

## 2019-08-13 LAB — HEPATIC FUNCTION PANEL
ALT: 13 U/L (ref 0–35)
AST: 11 U/L (ref 0–37)
Albumin: 4.2 g/dL (ref 3.5–5.2)
Alkaline Phosphatase: 82 U/L (ref 39–117)
Bilirubin, Direct: 0.1 mg/dL (ref 0.0–0.3)
Total Bilirubin: 0.5 mg/dL (ref 0.2–1.2)
Total Protein: 6.4 g/dL (ref 6.0–8.3)

## 2019-08-13 LAB — LIPID PANEL
Cholesterol: 191 mg/dL (ref 0–200)
HDL: 42 mg/dL (ref >39.00)
LDL Cholesterol: 124 mg/dL — ABNORMAL HIGH (ref 0–99)
NonHDL: 149.13
Total CHOL/HDL Ratio: 5
Triglycerides: 124 mg/dL (ref 0.0–149.0)
VLDL: 24.8 mg/dL (ref 0.0–40.0)

## 2019-08-13 LAB — TSH: TSH: 2.23 u[IU]/mL (ref 0.35–4.50)

## 2019-08-17 ENCOUNTER — Other Ambulatory Visit: Payer: Self-pay

## 2019-08-19 ENCOUNTER — Ambulatory Visit (INDEPENDENT_AMBULATORY_CARE_PROVIDER_SITE_OTHER): Payer: BC Managed Care – PPO | Admitting: Internal Medicine

## 2019-08-19 ENCOUNTER — Other Ambulatory Visit: Payer: Self-pay

## 2019-08-19 ENCOUNTER — Other Ambulatory Visit (HOSPITAL_COMMUNITY)
Admission: RE | Admit: 2019-08-19 | Discharge: 2019-08-19 | Disposition: A | Discharge status: Home / Self Care | Payer: BC Managed Care – PPO | Source: Ambulatory Visit | Attending: Internal Medicine | Admitting: Internal Medicine

## 2019-08-19 VITALS — BP 116/72 | HR 83 | Temp 95.9°F | Resp 16 | Ht 68.0 in | Wt 174.0 lb

## 2019-08-19 DIAGNOSIS — D582 Other hemoglobinopathies: Secondary | ICD-10-CM | POA: Diagnosis not present

## 2019-08-19 DIAGNOSIS — I10 Essential (primary) hypertension: Secondary | ICD-10-CM

## 2019-08-19 DIAGNOSIS — H0510 Unspecified chronic inflammatory disorders of orbit: Secondary | ICD-10-CM

## 2019-08-19 DIAGNOSIS — Z0000 Encounter for general adult medical examination without abnormal findings: Secondary | ICD-10-CM | POA: Diagnosis not present

## 2019-08-19 DIAGNOSIS — Z124 Encounter for screening for malignant neoplasm of cervix: Secondary | ICD-10-CM

## 2019-08-19 DIAGNOSIS — E119 Type 2 diabetes mellitus without complications: Secondary | ICD-10-CM

## 2019-08-19 DIAGNOSIS — Z720 Tobacco use: Secondary | ICD-10-CM

## 2019-08-19 DIAGNOSIS — E7800 Pure hypercholesterolemia, unspecified: Secondary | ICD-10-CM

## 2019-08-19 DIAGNOSIS — E559 Vitamin D deficiency, unspecified: Secondary | ICD-10-CM

## 2019-08-19 LAB — HM DIABETES FOOT EXAM

## 2019-08-19 NOTE — Progress Notes (Signed)
Patient ID: Carrie Wilson, female   DOB: 1955/12/21, 63 y.o.   MRN: [id number]   Subjective:    Patient ID: Carrie Wilson, female    DOB: 24-Jan-1956, 63 y.o.   MRN: [id number]  HPI  Patient here for her physical exam.  She reports she is doing well.  Seeing ophthalmology.  On MTX.  Stable.  Had recent labs.  a1c 6.4.  Discussed low carb diet and exercise.  No chest pain.  No sob.  No acid reflux.  No abdominal pain.  Bowels moving.  Overall feels good.  Discussed recommendation to start cholesterol medication.  She declines.  Discussed the need to quit smoking.  She declines.  Does not meet criteria for screening CT chest.     Past Medical History:  Diagnosis Date  . GERD (gastroesophageal reflux disease)    neg H. pylori  . Hyperlipidemia   . Hypertension   . Vitamin D deficiency    Past Surgical History:  Procedure Laterality Date  . CHOLECYSTECTOMY  1994  . rectal fissure repair  2002  . TUBAL LIGATION  1995   Family History  Problem Relation Age of Onset  . Hypertension Mother   . Heart disease Mother   . Hypercholesterolemia Brother   . Colon cancer Neg Hx   . Breast cancer Neg Hx    Social History   Socioeconomic History  . Marital status: Married    Spouse name: Not on file  . Number of children: 1  . Years of education: Not on file  . Highest education level: Not on file  Occupational History  . Not on file  Social Needs  . Financial resource strain: Not on file  . Food insecurity    Worry: Not on file    Inability: Not on file  . Transportation needs    Medical: Not on file    Non-medical: Not on file  Tobacco Use  . Smoking status: Current Every Day Smoker    Packs/day: 0.50    Years: 30.00    Pack years: 15.00    Types: Cigarettes  . Smokeless tobacco: Never Used  Substance and Sexual Activity  . Alcohol use: No    Alcohol/week: 0.0 standard drinks  . Drug use: No  . Sexual activity: Not on file  Lifestyle  . Physical  activity    Days per week: Not on file    Minutes per session: Not on file  . Stress: Not on file  Relationships  . Social Musician on phone: Not on file    Gets together: Not on file    Attends religious service: Not on file    Active member of club or organization: Not on file    Attends meetings of clubs or organizations: Not on file    Relationship status: Not on file  Other Topics Concern  . Not on file  Social History Narrative  . Not on file    Outpatient Encounter Medications as of 08/19/2019  Medication Sig  . cholecalciferol (VITAMIN D) 1000 units tablet Take 1,000 Units by mouth daily.  . folic acid (FOLVITE) 1 MG tablet Take 1 mg by mouth daily.  . methotrexate 2.5 MG tablet START WITH 6 TABLETS BY MOUTH ONCE A WEEK FOR FIRST WEEK THEN INCREASE TO 7 TABLETS ONCE A WEEK THEN 8 TABLETS WEEKLY   No facility-administered encounter medications on file as of 08/19/2019.    Review of Systems  Constitutional: Negative for  appetite change and unexpected weight change.  HENT: Negative for congestion and sinus pressure.   Eyes: Negative for pain and visual disturbance.  Respiratory: Negative for cough, chest tightness and shortness of breath.   Cardiovascular: Negative for chest pain, palpitations and leg swelling.  Gastrointestinal: Negative for abdominal pain, diarrhea, nausea and vomiting.  Genitourinary: Negative for difficulty urinating and dysuria.  Musculoskeletal: Negative for joint swelling and myalgias.  Skin: Negative for color change and rash.  Neurological: Negative for dizziness, light-headedness and headaches.  Hematological: Negative for adenopathy. Does not bruise/bleed easily.  Psychiatric/Behavioral: Negative for agitation and dysphoric mood.       Objective:    Physical Exam Constitutional:      General: She is not in acute distress.    Appearance: Normal appearance. She is well-developed.  HENT:     Head: Normocephalic and atraumatic.      Right Ear: External ear normal.     Left Ear: External ear normal.  Eyes:     General: No scleral icterus.       Right eye: No discharge.        Left eye: No discharge.     Conjunctiva/sclera: Conjunctivae normal.  Neck:     Musculoskeletal: Neck supple. No muscular tenderness.     Thyroid: No thyromegaly.  Cardiovascular:     Rate and Rhythm: Normal rate and regular rhythm.  Pulmonary:     Effort: No tachypnea, accessory muscle usage or respiratory distress.     Breath sounds: Normal breath sounds. No decreased breath sounds or wheezing.  Chest:     Breasts:        Right: No inverted nipple, mass, nipple discharge or tenderness (no axillary adenopathy).        Left: No inverted nipple, mass, nipple discharge or tenderness (no axilarry adenopathy).  Abdominal:     General: Bowel sounds are normal.     Palpations: Abdomen is soft.     Tenderness: There is no abdominal tenderness.  Genitourinary:    Comments: Normal external genitalia.  Vaginal vault without lesions.  Cervix identified.  Pap smear performed.  Could not appreciate any adnexal masses or tenderness.   Musculoskeletal:        General: No swelling or tenderness.  Lymphadenopathy:     Cervical: No cervical adenopathy.  Skin:    General: Skin is warm.     Findings: No erythema or rash.  Neurological:     Mental Status: She is alert and oriented to person, place, and time.  Psychiatric:        Mood and Affect: Mood normal.        Behavior: Behavior normal.     BP 116/72   Pulse 83   Temp (!) 95.9 F (35.5 C)   Resp 16   Ht 5\' 8"  (1.727 m)   Wt 174 lb (78.9 kg)   LMP 12/08/1997   SpO2 97%   BMI 26.46 kg/m  Wt Readings from Last 3 Encounters:  08/19/19 174 lb (78.9 kg)  12/15/18 181 lb 12.8 oz (82.5 kg)  11/03/18 185 lb 9.6 oz (84.2 kg)     Lab Results  Component Value Date   WBC 7.6 10/31/2018   HGB 15.0 10/31/2018   HCT 44.7 10/31/2018   PLT 198.0 10/31/2018   GLUCOSE 97 08/13/2019   CHOL  191 08/13/2019   TRIG 124.0 08/13/2019   HDL 42.00 08/13/2019   LDLDIRECT 110.0 06/18/2018   LDLCALC 124 (H) 08/13/2019   ALT  13 08/13/2019   AST 11 08/13/2019   NA 143 08/13/2019   K 4.2 08/13/2019   CL 105 08/13/2019   CREATININE 0.79 08/13/2019   BUN 9 08/13/2019   CO2 29 08/13/2019   TSH 2.23 08/13/2019   HGBA1C 6.4 08/13/2019   MICROALBUR 1.5 10/31/2018    Mm 3d Screen Breast Bilateral  Result Date: 07/15/2019 CLINICAL DATA:  Screening. EXAM: DIGITAL SCREENING BILATERAL MAMMOGRAM WITH TOMO AND CAD COMPARISON:  Previous exam(s). ACR Breast Density Category b: There are scattered areas of fibroglandular density. FINDINGS: There are no findings suspicious for malignancy. Images were processed with CAD. IMPRESSION: No mammographic evidence of malignancy. A result letter of this screening mammogram will be mailed directly to the patient. RECOMMENDATION: Screening mammogram in one year. (Code:SM-B-01Y) BI-RADS CATEGORY  1: Negative. Electronically Signed   By: Britta Mccreedy M.D.   On: 07/15/2019 12:38       Assessment & Plan:   Problem List Items Addressed This Visit    Diabetes mellitus without complication (HCC)    Low carb diet and exercise.  Follow met b and a1c.  Recent a1c 6.4.        Relevant Orders   Hemoglobin A1c   Basic metabolic panel   Microalbumin / creatinine urine ratio   Elevated hemoglobin (HCC)    Last hgb check wnl.  Follow.  Discussed the need to quit smoking.        Relevant Orders   CBC with Differential/Platelet   Health care maintenance    Physical today 08/19/19.  PAP 08/19/19.  Mammogram 07/15/19 - Birads I.  Colonoscopy due.  Will notify me when agreeable.        Hypercholesterolemia    Discussed the need to start cholesterol medication.  She declines.  Will notify me if changes her mind.        Relevant Orders   Hepatic function panel   Lipid panel   Hypertension    Blood pressure under good control.  On no medication.  Follow pressures.   Follow metabolic panel.        Idiopathic orbital inflammatory syndrome    On MTX.  Followed by rheumatology and ophthalmology.       Tobacco use    Discussed the need to quit smoking.  She declines to quit.  Follow.        Vitamin D deficiency    Follow vitamin D level.         Other Visit Diagnoses    Cervical cancer screening    -  Primary   Relevant Orders   Cytology - PAP( [facility])       Dale , MD

## 2019-08-23 ENCOUNTER — Encounter: Payer: Self-pay | Admitting: Internal Medicine

## 2019-08-23 DIAGNOSIS — D582 Other hemoglobinopathies: Secondary | ICD-10-CM | POA: Insufficient documentation

## 2019-08-23 NOTE — Assessment & Plan Note (Addendum)
Blood pressure under good control.  On no medication.  Follow pressures.  Follow metabolic panel.   

## 2019-08-23 NOTE — Assessment & Plan Note (Signed)
Discussed the need to start cholesterol medication.  She declines.  Will notify me if changes her mind.

## 2019-08-23 NOTE — Assessment & Plan Note (Signed)
Last hgb check wnl.  Follow.  Discussed the need to quit smoking.

## 2019-08-23 NOTE — Assessment & Plan Note (Signed)
Low carb diet and exercise.  Follow met b and a1c.  Recent a1c 6.4.

## 2019-08-23 NOTE — Assessment & Plan Note (Signed)
Physical today 08/19/19.  PAP 08/19/19.  Mammogram 07/15/19 - Birads I.  Colonoscopy due.  Will notify me when agreeable.

## 2019-08-23 NOTE — Assessment & Plan Note (Signed)
Discussed the need to quit smoking.  She declines to quit.  Follow.   

## 2019-08-23 NOTE — Assessment & Plan Note (Signed)
Follow vitamin D level.  

## 2019-08-23 NOTE — Assessment & Plan Note (Signed)
On MTX.  Followed by rheumatology and ophthalmology.

## 2019-08-27 LAB — CYTOLOGY - PAP
Comment: NEGATIVE
Diagnosis: NEGATIVE
High risk HPV: NEGATIVE

## 2019-10-05 DIAGNOSIS — E559 Vitamin D deficiency, unspecified: Secondary | ICD-10-CM | POA: Diagnosis not present

## 2019-10-05 DIAGNOSIS — H0510 Unspecified chronic inflammatory disorders of orbit: Secondary | ICD-10-CM | POA: Diagnosis not present

## 2019-10-05 DIAGNOSIS — Z5181 Encounter for therapeutic drug level monitoring: Secondary | ICD-10-CM | POA: Diagnosis not present

## 2019-10-05 DIAGNOSIS — Z79899 Other long term (current) drug therapy: Secondary | ICD-10-CM | POA: Diagnosis not present

## 2019-11-19 ENCOUNTER — Ambulatory Visit: Payer: Medicare Other | Attending: Internal Medicine

## 2019-11-19 DIAGNOSIS — Z20822 Contact with and (suspected) exposure to covid-19: Secondary | ICD-10-CM

## 2019-11-20 ENCOUNTER — Telehealth: Payer: Self-pay

## 2019-11-20 NOTE — Telephone Encounter (Signed)
Patient called in requesting [facility] lab results  - DOB/Address verified - results pending. Reviewed testing process with patient, no further questions.

## 2019-11-21 LAB — NOVEL CORONAVIRUS, NAA: SARS-CoV-2, NAA: NOT DETECTED

## 2019-11-21 NOTE — Telephone Encounter (Signed)
Pt aware covid lab test negative, not detected °

## 2020-02-17 ENCOUNTER — Other Ambulatory Visit: Payer: Self-pay

## 2020-02-17 ENCOUNTER — Encounter: Payer: Self-pay | Admitting: Internal Medicine

## 2020-02-17 ENCOUNTER — Ambulatory Visit (INDEPENDENT_AMBULATORY_CARE_PROVIDER_SITE_OTHER): Payer: Medicare Other | Admitting: Internal Medicine

## 2020-02-17 VITALS — BP 112/70 | HR 78 | Temp 97.3°F | Resp 16 | Ht 68.0 in | Wt 161.3 lb

## 2020-02-17 DIAGNOSIS — Z720 Tobacco use: Secondary | ICD-10-CM

## 2020-02-17 DIAGNOSIS — E559 Vitamin D deficiency, unspecified: Secondary | ICD-10-CM

## 2020-02-17 DIAGNOSIS — D582 Other hemoglobinopathies: Secondary | ICD-10-CM | POA: Diagnosis not present

## 2020-02-17 DIAGNOSIS — E7800 Pure hypercholesterolemia, unspecified: Secondary | ICD-10-CM

## 2020-02-17 DIAGNOSIS — H0510 Unspecified chronic inflammatory disorders of orbit: Secondary | ICD-10-CM

## 2020-02-17 DIAGNOSIS — R634 Abnormal weight loss: Secondary | ICD-10-CM | POA: Diagnosis not present

## 2020-02-17 DIAGNOSIS — E119 Type 2 diabetes mellitus without complications: Secondary | ICD-10-CM | POA: Diagnosis not present

## 2020-02-17 DIAGNOSIS — I10 Essential (primary) hypertension: Secondary | ICD-10-CM | POA: Diagnosis not present

## 2020-02-17 LAB — BASIC METABOLIC PANEL
BUN: 9 mg/dL (ref 6–23)
CO2: 29 mEq/L (ref 19–32)
Calcium: 9 mg/dL (ref 8.4–10.5)
Chloride: 108 mEq/L (ref 96–112)
Creatinine, Ser: 0.82 mg/dL (ref 0.40–1.20)
GFR: 69.9 mL/min (ref >60.00)
Glucose, Bld: 100 mg/dL — ABNORMAL HIGH (ref 70–99)
Potassium: 4.1 mEq/L (ref 3.5–5.1)
Sodium: 141 mEq/L (ref 135–145)

## 2020-02-17 LAB — LIPID PANEL
Cholesterol: 185 mg/dL (ref 0–200)
HDL: 35.5 mg/dL — ABNORMAL LOW (ref >39.00)
LDL Cholesterol: 123 mg/dL — ABNORMAL HIGH (ref 0–99)
NonHDL: 149.39
Total CHOL/HDL Ratio: 5
Triglycerides: 130 mg/dL (ref 0.0–149.0)
VLDL: 26 mg/dL (ref 0.0–40.0)

## 2020-02-17 LAB — HEMOGLOBIN A1C: Hgb A1c MFr Bld: 6 % (ref 4.6–6.5)

## 2020-02-17 LAB — CBC WITH DIFFERENTIAL/PLATELET
Basophils Absolute: 0.1 10*3/uL (ref 0.0–0.1)
Basophils Relative: 0.8 % (ref 0.0–3.0)
Eosinophils Absolute: 0.1 10*3/uL (ref 0.0–0.7)
Eosinophils Relative: 0.8 % (ref 0.0–5.0)
HCT: 42.7 % (ref 36.0–46.0)
Hemoglobin: 14.4 g/dL (ref 12.0–15.0)
Lymphocytes Relative: 25.6 % (ref 12.0–46.0)
Lymphs Abs: 1.8 10*3/uL (ref 0.7–4.0)
MCHC: 33.8 g/dL (ref 30.0–36.0)
MCV: 96.4 fl (ref 78.0–100.0)
Monocytes Absolute: 0.5 10*3/uL (ref 0.1–1.0)
Monocytes Relative: 7 % (ref 3.0–12.0)
Neutro Abs: 4.6 10*3/uL (ref 1.4–7.7)
Neutrophils Relative %: 65.8 % (ref 43.0–77.0)
Platelets: 209 10*3/uL (ref 150.0–400.0)
RBC: 4.42 Mil/uL (ref 3.87–5.11)
RDW: 13.8 % (ref 11.5–15.5)
WBC: 7 10*3/uL (ref 4.0–10.5)

## 2020-02-17 LAB — TSH: TSH: 2.13 u[IU]/mL (ref 0.35–4.50)

## 2020-02-17 LAB — MICROALBUMIN / CREATININE URINE RATIO
Creatinine,U: 241.9 mg/dL
Microalb Creat Ratio: 0.5 mg/g (ref 0.0–30.0)
Microalb, Ur: 1.1 mg/dL (ref 0.0–1.9)

## 2020-02-17 LAB — HEPATIC FUNCTION PANEL
ALT: 10 U/L (ref 0–35)
AST: 13 U/L (ref 0–37)
Albumin: 4.1 g/dL (ref 3.5–5.2)
Alkaline Phosphatase: 74 U/L (ref 39–117)
Bilirubin, Direct: 0.2 mg/dL (ref 0.0–0.3)
Total Bilirubin: 0.8 mg/dL (ref 0.2–1.2)
Total Protein: 6.3 g/dL (ref 6.0–8.3)

## 2020-02-17 LAB — VITAMIN D 25 HYDROXY (VIT D DEFICIENCY, FRACTURES): VITD: 21.37 ng/mL — ABNORMAL LOW (ref 30.00–100.00)

## 2020-02-17 NOTE — Progress Notes (Signed)
Patient ID: Carrie Wilson, female   DOB: 01-13-1956, 64 y.o.   MRN: [id number]   Subjective:    Patient ID: Carrie Wilson, female    DOB: [birth year], 64 y.o.   MRN: [id number]  HPI This visit occurred during the SARS-CoV-2 public health emergency.  Safety protocols were in place, including screening questions prior to the visit, additional usage of staff PPE, and extensive cleaning of exam room while observing appropriate contact time as indicated for disinfecting solutions.  Patient here for a scheduled follow up. She is doing well.  Lost weight.  Not eating beef and pork.  States since being on the MTX it has affected her eating.  She is tapering the dose now.  Starting to notice her appetite is coming back.  Does feel better since lost weight.  No chest pain.  No sob.  No acid reflux.  No abdominal pain.  Bowels moving.    Past Medical History:  Diagnosis Date  . GERD (gastroesophageal reflux disease)    neg H. pylori  . Hyperlipidemia   . Hypertension   . Vitamin D deficiency    Past Surgical History:  Procedure Laterality Date  . CHOLECYSTECTOMY  1994  . rectal fissure repair  2002  . TUBAL LIGATION  1995   Family History  Problem Relation Age of Onset  . Hypertension Mother   . Heart disease Mother   . Hypercholesterolemia Brother   . Colon cancer Neg Hx   . Breast cancer Neg Hx    Social History   Socioeconomic History  . Marital status: Married    Spouse name: Not on file  . Number of children: 1  . Years of education: Not on file  . Highest education level: Not on file  Occupational History  . Not on file  Tobacco Use  . Smoking status: Current Every Day Smoker    Packs/day: 0.50    Years: 30.00    Pack years: 15.00    Types: Cigarettes  . Smokeless tobacco: Never Used  Substance and Sexual Activity  . Alcohol use: No    Alcohol/week: 0.0 standard drinks  . Drug use: No  . Sexual activity: Not on file  Other Topics Concern  . Not on  file  Social History Narrative  . Not on file   Social Determinants of Health   Financial Resource Strain:   . Difficulty of Paying Living Expenses:   Food Insecurity:   . Worried About Programme researcher, broadcasting/film/video in the Last Year:   . Barista in the Last Year:   Transportation Needs:   . Freight forwarder (Medical):   Marland Kitchen Lack of Transportation (Non-Medical):   Physical Activity:   . Days of Exercise per Week:   . Minutes of Exercise per Session:   Stress:   . Feeling of Stress :   Social Connections:   . Frequency of Communication with Friends and Family:   . Frequency of Social Gatherings with Friends and Family:   . Attends Religious Services:   . Active Member of Clubs or Organizations:   . Attends Banker Meetings:   Marland Kitchen Marital Status:     Outpatient Encounter Medications as of 02/17/2020  Medication Sig  . cholecalciferol (VITAMIN D) 1000 units tablet Take 1,000 Units by mouth daily.  . folic acid (FOLVITE) 1 MG tablet Take 1 mg by mouth daily.  . methotrexate 2.5 MG tablet START WITH 6 TABLETS BY MOUTH ONCE A  WEEK FOR FIRST WEEK THEN INCREASE TO 7 TABLETS ONCE A WEEK THEN 8 TABLETS WEEKLY   No facility-administered encounter medications on file as of 02/17/2020.    Review of Systems  Constitutional: Negative for appetite change and fever.  HENT: Negative for congestion and sinus pressure.   Respiratory: Negative for cough, chest tightness and shortness of breath.   Cardiovascular: Negative for chest pain, palpitations and leg swelling.  Gastrointestinal: Negative for abdominal pain, diarrhea, nausea and vomiting.  Genitourinary: Negative for difficulty urinating and dysuria.  Musculoskeletal: Negative for joint swelling and myalgias.  Skin: Negative for color change and rash.  Neurological: Negative for dizziness, light-headedness and headaches.  Psychiatric/Behavioral: Negative for agitation and dysphoric mood.       Objective:    Physical  Exam Vitals reviewed.  Constitutional:      General: She is not in acute distress.    Appearance: Normal appearance.  HENT:     Head: Normocephalic and atraumatic.     Right Ear: External ear normal.     Left Ear: External ear normal.  Eyes:     General: No scleral icterus.       Right eye: No discharge.        Left eye: No discharge.  Neck:     Thyroid: No thyromegaly.  Cardiovascular:     Rate and Rhythm: Normal rate and regular rhythm.  Pulmonary:     Effort: No respiratory distress.     Breath sounds: Normal breath sounds. No wheezing.  Abdominal:     General: Bowel sounds are normal.     Palpations: Abdomen is soft.     Tenderness: There is no abdominal tenderness.  Musculoskeletal:        General: No swelling or tenderness.     Cervical back: Neck supple. No tenderness.  Lymphadenopathy:     Cervical: No cervical adenopathy.  Skin:    Findings: No erythema or rash.  Neurological:     Mental Status: She is alert.  Psychiatric:        Mood and Affect: Mood normal.        Behavior: Behavior normal.     BP 112/70   Pulse 78   Temp (!) 97.3 F (36.3 C)   Resp 16   Ht 5\' 8"  (1.727 m)   Wt 161 lb 4.8 oz (73.2 kg)   LMP 12/08/1997   SpO2 98%   BMI 24.53 kg/m  Wt Readings from Last 3 Encounters:  02/17/20 161 lb 4.8 oz (73.2 kg)  08/19/19 174 lb (78.9 kg)  12/15/18 181 lb 12.8 oz (82.5 kg)     Lab Results  Component Value Date   WBC 7.0 02/17/2020   HGB 14.4 02/17/2020   HCT 42.7 02/17/2020   PLT 209.0 02/17/2020   GLUCOSE 100 (H) 02/17/2020   CHOL 185 02/17/2020   TRIG 130.0 02/17/2020   HDL 35.50 (L) 02/17/2020   LDLDIRECT 110.0 06/18/2018   LDLCALC 123 (H) 02/17/2020   ALT 10 02/17/2020   AST 13 02/17/2020   NA 141 02/17/2020   K 4.1 02/17/2020   CL 108 02/17/2020   CREATININE 0.82 02/17/2020   BUN 9 02/17/2020   CO2 29 02/17/2020   TSH 2.13 02/17/2020   HGBA1C 6.0 02/17/2020   MICROALBUR 1.1 02/17/2020       Assessment & Plan:    Problem List Items Addressed This Visit    Diabetes mellitus without complication (HCC) - Primary    She has adjusted  her diet.  Lost weight.  Follow met b and a1c.       Elevated hemoglobin (HCC)    Recheck cbc today.  Has cut down on her smoking.       Hypercholesterolemia    She has declined cholesterol medication.  Has adjusted diet.  Lost weight.  Check lipid panel.        Hypertension   Idiopathic orbital inflammatory syndrome    On MTX.  Tapering dose now.  Followed by rheumatology and ophthalmology.        Tobacco use    Has decreased some the amount she is smoking.  Follow. Previously did not meet criteria for screening CT scan.        Vitamin D deficiency   Relevant Orders   VITAMIN D 25 Hydroxy (Vit-D Deficiency, Fractures) (Completed)   Weight loss    Is eating.  Some decreased po intake since being on MTX.  With the taper, appetite has improved.  She is eating regular meals.  Denies being sick.  No nausea or vomiting.  Follow.        Relevant Orders   TSH (Completed)       Dale Eastland, MD

## 2020-02-18 ENCOUNTER — Telehealth: Payer: Self-pay | Admitting: Internal Medicine

## 2020-02-18 NOTE — Telephone Encounter (Signed)
See result note.  

## 2020-02-18 NOTE — Telephone Encounter (Signed)
Pt is returning a call back about labs

## 2020-02-27 ENCOUNTER — Encounter: Payer: Self-pay | Admitting: Internal Medicine

## 2020-02-27 NOTE — Assessment & Plan Note (Signed)
Recheck cbc today.  Has cut down on her smoking.

## 2020-02-27 NOTE — Assessment & Plan Note (Signed)
On MTX.  Tapering dose now.  Followed by rheumatology and ophthalmology.

## 2020-02-27 NOTE — Assessment & Plan Note (Addendum)
Has decreased some the amount she is smoking.  Follow. Previously did not meet criteria for screening CT scan.

## 2020-02-27 NOTE — Assessment & Plan Note (Signed)
She has adjusted her diet.  Lost weight.  Follow met b and a1c.   

## 2020-02-27 NOTE — Assessment & Plan Note (Signed)
She has declined cholesterol medication.  Has adjusted diet.  Lost weight.  Check lipid panel.

## 2020-02-27 NOTE — Assessment & Plan Note (Signed)
Is eating.  Some decreased po intake since being on MTX.  With the taper, appetite has improved.  She is eating regular meals.  Denies being sick.  No nausea or vomiting.  Follow.

## 2020-05-01 ENCOUNTER — Telehealth: Payer: Self-pay | Admitting: Internal Medicine

## 2020-05-01 NOTE — Telephone Encounter (Signed)
Please call pt and confirm her smoking history.  Please confirm how long she smoked and how many packs smoked.  Also confirm when she quit.  There are new guidelines and I want to see if she qualifies now.  Thanks

## 2020-05-02 NOTE — Telephone Encounter (Signed)
LMTCB

## 2020-05-03 ENCOUNTER — Other Ambulatory Visit: Payer: Self-pay

## 2020-05-03 DIAGNOSIS — Z1231 Encounter for screening mammogram for malignant neoplasm of breast: Secondary | ICD-10-CM

## 2020-05-03 NOTE — Progress Notes (Signed)
Order placed for mammogram.

## 2020-05-03 NOTE — Telephone Encounter (Signed)
Spoke with patient. She smoked about 1/3 to a 1/2 a pack a day. Started smoking when she was about 27. She quit smoking about 3 weeks ago.

## 2020-05-03 NOTE — Telephone Encounter (Signed)
Does not qualify.  Had tried to get CT scan authorized for lung screening

## 2020-07-27 ENCOUNTER — Ambulatory Visit
Admission: RE | Admit: 2020-07-27 | Discharge: 2020-07-27 | Disposition: A | Discharge status: Home / Self Care | Payer: Medicare Other | Source: Ambulatory Visit | Attending: Internal Medicine | Admitting: Internal Medicine

## 2020-07-27 ENCOUNTER — Other Ambulatory Visit: Payer: Self-pay

## 2020-07-27 DIAGNOSIS — Z1231 Encounter for screening mammogram for malignant neoplasm of breast: Secondary | ICD-10-CM | POA: Diagnosis not present

## 2020-08-22 ENCOUNTER — Encounter: Payer: Self-pay | Admitting: Internal Medicine

## 2020-08-22 ENCOUNTER — Ambulatory Visit (INDEPENDENT_AMBULATORY_CARE_PROVIDER_SITE_OTHER): Payer: Medicare Other | Admitting: Internal Medicine

## 2020-08-22 ENCOUNTER — Other Ambulatory Visit: Payer: Self-pay

## 2020-08-22 VITALS — BP 122/70 | HR 70 | Temp 97.8°F | Resp 16 | Ht 68.0 in | Wt 165.2 lb

## 2020-08-22 DIAGNOSIS — E7800 Pure hypercholesterolemia, unspecified: Secondary | ICD-10-CM | POA: Diagnosis not present

## 2020-08-22 DIAGNOSIS — Z0000 Encounter for general adult medical examination without abnormal findings: Secondary | ICD-10-CM

## 2020-08-22 DIAGNOSIS — Z720 Tobacco use: Secondary | ICD-10-CM | POA: Diagnosis not present

## 2020-08-22 DIAGNOSIS — H0510 Unspecified chronic inflammatory disorders of orbit: Secondary | ICD-10-CM

## 2020-08-22 DIAGNOSIS — H0589 Other disorders of orbit: Secondary | ICD-10-CM

## 2020-08-22 DIAGNOSIS — Z1211 Encounter for screening for malignant neoplasm of colon: Secondary | ICD-10-CM

## 2020-08-22 DIAGNOSIS — E119 Type 2 diabetes mellitus without complications: Secondary | ICD-10-CM

## 2020-08-22 DIAGNOSIS — E559 Vitamin D deficiency, unspecified: Secondary | ICD-10-CM | POA: Diagnosis not present

## 2020-08-22 DIAGNOSIS — D582 Other hemoglobinopathies: Secondary | ICD-10-CM

## 2020-08-22 LAB — COMPREHENSIVE METABOLIC PANEL
ALT: 9 U/L (ref 0–35)
AST: 11 U/L (ref 0–37)
Albumin: 4 g/dL (ref 3.5–5.2)
Alkaline Phosphatase: 89 U/L (ref 39–117)
BUN: 10 mg/dL (ref 6–23)
CO2: 29 mEq/L (ref 19–32)
Calcium: 9.2 mg/dL (ref 8.4–10.5)
Chloride: 105 mEq/L (ref 96–112)
Creatinine, Ser: 0.86 mg/dL (ref 0.40–1.20)
GFR: 70.48 mL/min (ref >60.00)
Glucose, Bld: 92 mg/dL (ref 70–99)
Potassium: 4.4 mEq/L (ref 3.5–5.1)
Sodium: 141 mEq/L (ref 135–145)
Total Bilirubin: 0.5 mg/dL (ref 0.2–1.2)
Total Protein: 6.4 g/dL (ref 6.0–8.3)

## 2020-08-22 LAB — LIPID PANEL
Cholesterol: 199 mg/dL (ref 0–200)
HDL: 39 mg/dL — ABNORMAL LOW (ref >39.00)
LDL Cholesterol: 124 mg/dL — ABNORMAL HIGH (ref 0–99)
NonHDL: 159.98
Total CHOL/HDL Ratio: 5
Triglycerides: 178 mg/dL — ABNORMAL HIGH (ref 0.0–149.0)
VLDL: 35.6 mg/dL (ref 0.0–40.0)

## 2020-08-22 LAB — HEMOGLOBIN A1C: Hgb A1c MFr Bld: 6.2 % (ref 4.6–6.5)

## 2020-08-22 NOTE — Progress Notes (Signed)
Patient ID: Carrie Wilson, female   DOB: January 07, 1956, 64 y.o.   MRN: [id number]   Subjective:    Patient ID: Carrie Wilson, female    DOB: Nov 07, 1956, 64 y.o.   MRN: [id number]  HPI This visit occurred during the SARS-CoV-2 public health emergency.  Safety protocols were in place, including screening questions prior to the visit, additional usage of staff PPE, and extensive cleaning of exam room while observing appropriate contact time as indicated for disinfecting solutions.  Patient with past history of idiopathic orbital mass previously treated with MTX, diabetes and elevated cholesterol.  She comes in today to follow up on these issues as well as for a complete physical exam.  Increased stress recently.  Her husband had CABG.  He is doing better now.  She feels she is handling things well.  Work is better and she is only filling in now. Can set her own hours.  No chest pain or sob.  Stays active.  No acid reflux or abdominal pain reported.  Bowels moving.  Agreeable for referral for colonoscopy.  Has only smoked three cigarettes since May.    Past Medical History:  Diagnosis Date  . GERD (gastroesophageal reflux disease)    neg H. pylori  . Hyperlipidemia   . Hypertension   . Vitamin D deficiency    Past Surgical History:  Procedure Laterality Date  . CHOLECYSTECTOMY  1994  . rectal fissure repair  2002  . TUBAL LIGATION  1995   Family History  Problem Relation Age of Onset  . Hypertension Mother   . Heart disease Mother   . Hypercholesterolemia Brother   . Colon cancer Neg Hx   . Breast cancer Neg Hx    Social History   Socioeconomic History  . Marital status: Married    Spouse name: Not on file  . Number of children: 1  . Years of education: Not on file  . Highest education level: Not on file  Occupational History  . Not on file  Tobacco Use  . Smoking status: Current Every Day Smoker    Packs/day: 0.50    Years: 30.00    Pack years: 15.00     Types: Cigarettes  . Smokeless tobacco: Never Used  Vaping Use  . Vaping Use: Never used  Substance and Sexual Activity  . Alcohol use: No    Alcohol/week: 0.0 standard drinks  . Drug use: No  . Sexual activity: Not on file  Other Topics Concern  . Not on file  Social History Narrative  . Not on file   Social Determinants of Health   Financial Resource Strain:   . Difficulty of Paying Living Expenses: Not on file  Food Insecurity:   . Worried About Programme researcher, broadcasting/film/video in the Last Year: Not on file  . Ran Out of Food in the Last Year: Not on file  Transportation Needs:   . Lack of Transportation (Medical): Not on file  . Lack of Transportation (Non-Medical): Not on file  Physical Activity:   . Days of Exercise per Week: Not on file  . Minutes of Exercise per Session: Not on file  Stress:   . Feeling of Stress : Not on file  Social Connections:   . Frequency of Communication with Friends and Family: Not on file  . Frequency of Social Gatherings with Friends and Family: Not on file  . Attends Religious Services: Not on file  . Active Member of Clubs or Organizations: Not on  file  . Attends Banker Meetings: Not on file  . Marital Status: Not on file    Outpatient Encounter Medications as of 08/22/2020  Medication Sig  . cholecalciferol (VITAMIN D) 1000 units tablet Take 1,000 Units by mouth daily.  . [DISCONTINUED] folic acid (FOLVITE) 1 MG tablet Take 1 mg by mouth daily.  . [DISCONTINUED] methotrexate 2.5 MG tablet START WITH 6 TABLETS BY MOUTH ONCE A WEEK FOR FIRST WEEK THEN INCREASE TO 7 TABLETS ONCE A WEEK THEN 8 TABLETS WEEKLY   No facility-administered encounter medications on file as of 08/22/2020.    Review of Systems  Constitutional: Negative for appetite change and unexpected weight change.  HENT: Negative for congestion, sinus pressure and sore throat.   Eyes: Negative for pain and visual disturbance.  Respiratory: Negative for cough, chest  tightness and shortness of breath.   Cardiovascular: Negative for chest pain, palpitations and leg swelling.  Gastrointestinal: Negative for abdominal pain, diarrhea, nausea and vomiting.  Genitourinary: Negative for difficulty urinating and dysuria.  Musculoskeletal: Negative for joint swelling and myalgias.  Skin: Negative for color change and rash.  Neurological: Negative for dizziness, light-headedness and headaches.  Hematological: Negative for adenopathy. Does not bruise/bleed easily.  Psychiatric/Behavioral: Negative for agitation and dysphoric mood.       Objective:    Physical Exam Vitals reviewed.  Constitutional:      General: She is not in acute distress.    Appearance: Normal appearance. She is well-developed.  HENT:     Head: Normocephalic and atraumatic.     Right Ear: External ear normal.     Left Ear: External ear normal.  Eyes:     General: No scleral icterus.       Right eye: No discharge.        Left eye: No discharge.     Conjunctiva/sclera: Conjunctivae normal.  Neck:     Thyroid: No thyromegaly.  Cardiovascular:     Rate and Rhythm: Normal rate and regular rhythm.  Pulmonary:     Effort: No tachypnea, accessory muscle usage or respiratory distress.     Breath sounds: Normal breath sounds. No decreased breath sounds or wheezing.  Chest:     Breasts:        Right: No inverted nipple, mass, nipple discharge or tenderness (no axillary adenopathy).        Left: No inverted nipple, mass, nipple discharge or tenderness (no axilarry adenopathy).  Abdominal:     General: Bowel sounds are normal.     Palpations: Abdomen is soft.     Tenderness: There is no abdominal tenderness.  Musculoskeletal:        General: No swelling or tenderness.     Cervical back: Neck supple. No tenderness.  Lymphadenopathy:     Cervical: No cervical adenopathy.  Skin:    Findings: No erythema or rash.     Comments: Healing abrasion - left 5th toe.  No increased erythema.  No  tenderness.    Neurological:     Mental Status: She is alert and oriented to person, place, and time.  Psychiatric:        Mood and Affect: Mood normal.        Behavior: Behavior normal.     BP 122/70   Pulse 70   Temp 97.8 F (36.6 C) (Oral)   Resp 16   Ht 5\' 8"  (1.727 m)   Wt 165 lb 3.2 oz (74.9 kg)   LMP 12/08/1997   SpO2  99%   BMI 25.12 kg/m  Wt Readings from Last 3 Encounters:  08/22/20 165 lb 3.2 oz (74.9 kg)  02/17/20 161 lb 4.8 oz (73.2 kg)  08/19/19 174 lb (78.9 kg)     Lab Results  Component Value Date   WBC 7.0 02/17/2020   HGB 14.4 02/17/2020   HCT 42.7 02/17/2020   PLT 209.0 02/17/2020   GLUCOSE 92 08/22/2020   CHOL 199 08/22/2020   TRIG 178.0 (H) 08/22/2020   HDL 39.00 (L) 08/22/2020   LDLDIRECT 110.0 06/18/2018   LDLCALC 124 (H) 08/22/2020   ALT 9 08/22/2020   AST 11 08/22/2020   NA 141 08/22/2020   K 4.4 08/22/2020   CL 105 08/22/2020   CREATININE 0.86 08/22/2020   BUN 10 08/22/2020   CO2 29 08/22/2020   TSH 2.13 02/17/2020   HGBA1C 6.2 08/22/2020   MICROALBUR 1.1 02/17/2020    MM 3D SCREEN BREAST BILATERAL  Result Date: 07/28/2020 CLINICAL DATA:  Screening. EXAM: DIGITAL SCREENING BILATERAL MAMMOGRAM WITH TOMO AND CAD COMPARISON:  Previous exam(s). ACR Breast Density Category b: There are scattered areas of fibroglandular density. FINDINGS: There are no findings suspicious for malignancy. Images were processed with CAD. IMPRESSION: No mammographic evidence of malignancy. A result letter of this screening mammogram will be mailed directly to the patient. RECOMMENDATION: Screening mammogram in one year. (Code:SM-B-01Y) BI-RADS CATEGORY  1: Negative. Electronically Signed   By: Meda Klinefelter MD   On: 07/28/2020 16:16       Assessment & Plan:   Problem List Items Addressed This Visit    Vitamin D deficiency    With known vitamin D deficiency.  Plans to start taking vitamin D supplements on a more regular basis. Follow vitamin D level.        Tobacco use    She has only smoked three cigarettes since May.  Doing well.  Previously did not meet criteria for screening CT scan.  Discussed with her today.        Orbital mass    S/p orbitomy.  Followed by ophthalmology and rheumatology.  Off MTX now.  Follow.       Idiopathic orbital inflammatory syndrome    Off MTX now.  Followed by rheumatology and ophthalmology.       Hypercholesterolemia - Primary    Low cholesterol diet and exercise.  Has previously declined cholesterol medication.  Recheck lipid panel today.  Follow.        Relevant Orders   Lipid panel (Completed)   Health care maintenance    Physical today 08/22/20.  PAP 08/19/19 - negative pap with negative HPV.  Mammogram 07/28/20 - Birads I.  Agreeable for referral for colonoscopy.  Prefers to return to [place].       Elevated hemoglobin (HCC)    Last hgb wnl.       Diabetes mellitus without complication (HCC)    Has adjusted diet.  Low carb diet and exercise.  Follow met b and a1c.       Relevant Orders   Comprehensive metabolic panel (Completed)   Hemoglobin A1c (Completed)    Other Visit Diagnoses    Colon cancer screening       Relevant Orders   Ambulatory referral to Gastroenterology       Dale [place], MD

## 2020-08-22 NOTE — Assessment & Plan Note (Signed)
Physical today 08/22/20.  PAP 08/19/19 - negative pap with negative HPV.  Mammogram 07/28/20 - Birads I.  Agreeable for referral for colonoscopy.  Prefers to return to [place].

## 2020-08-23 ENCOUNTER — Encounter: Payer: Self-pay | Admitting: Internal Medicine

## 2020-08-23 NOTE — Assessment & Plan Note (Signed)
With known vitamin D deficiency.  Plans to start taking vitamin D supplements on a more regular basis. Follow vitamin D level.

## 2020-08-23 NOTE — Assessment & Plan Note (Signed)
Last hgb wnl.

## 2020-08-23 NOTE — Assessment & Plan Note (Signed)
S/p orbitomy.  Followed by ophthalmology and rheumatology.  Off MTX now.  Follow.

## 2020-08-23 NOTE — Assessment & Plan Note (Signed)
Low cholesterol diet and exercise.  Has previously declined cholesterol medication.  Recheck lipid panel today.  Follow.

## 2020-08-23 NOTE — Assessment & Plan Note (Signed)
Has adjusted diet.  Low carb diet and exercise.  Follow met b and a1c.

## 2020-08-23 NOTE — Assessment & Plan Note (Signed)
Off MTX now.  Followed by rheumatology and ophthalmology.

## 2020-08-23 NOTE — Assessment & Plan Note (Addendum)
She has only smoked three cigarettes since May.  Doing well.  Previously did not meet criteria for screening CT scan.  Discussed with her today.

## 2020-10-13 ENCOUNTER — Telehealth: Payer: Self-pay

## 2020-10-13 NOTE — Telephone Encounter (Signed)
Contacted patient for lung CT screening clinic based on referral from [facility].  Patient states she is at work and asked [place] to call her back on Monday and she is off that day.

## 2020-10-31 ENCOUNTER — Telehealth: Payer: Self-pay | Admitting: *Deleted

## 2020-10-31 DIAGNOSIS — Z87891 Personal history of nicotine dependence: Secondary | ICD-10-CM

## 2020-10-31 DIAGNOSIS — Z122 Encounter for screening for malignant neoplasm of respiratory organs: Secondary | ICD-10-CM

## 2020-10-31 NOTE — Telephone Encounter (Signed)
Received referral for initial lung cancer screening scan. Contacted patient and obtained smoking history,(former, quit 02/11/20, 20 pack year) as well as answering questions related to screening process. Patient denies signs of lung cancer such as weight loss or hemoptysis. Patient denies comorbidity that would prevent curative treatment if lung cancer were found. Patient is scheduled for shared decision making visit and CT scan on 12/21/20 at 10am.

## 2020-11-21 ENCOUNTER — Telehealth: Payer: Self-pay | Admitting: Internal Medicine

## 2020-11-21 NOTE — Telephone Encounter (Signed)
Left message for patient to call back and schedule Medicare Annual Wellness Visit (AWV)   This should be a virtual visit only=30 minutes.  No hx of AWV; please schedule at anytime with Denisa O'Brien-Blaney at [facility]  AWV-I PER [place]

## 2020-11-30 ENCOUNTER — Ambulatory Visit: Payer: Medicare HMO

## 2020-12-07 ENCOUNTER — Ambulatory Visit (INDEPENDENT_AMBULATORY_CARE_PROVIDER_SITE_OTHER): Payer: Medicare HMO

## 2020-12-07 VITALS — Ht 68.0 in | Wt 165.0 lb

## 2020-12-07 DIAGNOSIS — Z1159 Encounter for screening for other viral diseases: Secondary | ICD-10-CM

## 2020-12-07 DIAGNOSIS — Z0000 Encounter for general adult medical examination without abnormal findings: Secondary | ICD-10-CM

## 2020-12-07 NOTE — Patient Instructions (Addendum)
Carrie Wilson , Thank you for taking time to come for your Medicare Wellness Visit. I appreciate your ongoing commitment to your health goals. Please review the following plan we discussed and let me know if I can assist you in the future.   These are the goals we discussed: Goals    . Maintain Healthy Lifestyle     Stay active Healthy diet        This is a list of the screening recommended for you and due dates:  Health Maintenance  Topic Date Due  .  Hepatitis C: One time screening is recommended by Center for Disease Control  (CDC) for  adults born from [birth year] through 1967.   Never done  . Colon Cancer Screening  12/15/2014  . Complete foot exam   08/18/2020  . Eye exam for diabetics  02/10/2021  . Urine Protein Check  02/16/2021  . Hemoglobin A1C  02/20/2021  . Mammogram  07/27/2021  . DEXA scan (bone density measurement)  Completed  . Flu Shot  Discontinued  . Tetanus Vaccine  Discontinued  . COVID-19 Vaccine  Discontinued  . Pneumonia vaccines  Discontinued    Immunizations  There is no immunization history on file for this patient.  Keep all routine maintenance appointments.   Follow up 02/20/21 @ 8:30  Advanced directives: not yet completed  Conditions/risks identified: none new  Follow up in one year for your annual wellness visit    Preventive Care 65 Years and Older, Female Preventive care refers to lifestyle choices and visits with your health care provider that can promote health and wellness. What does preventive care include?  A yearly physical exam. This is also called an annual well check.  Dental exams once or twice a year.  Routine eye exams. Ask your health care provider how often you should have your eyes checked.  Personal lifestyle choices, including:  Daily care of your teeth and gums.  Regular physical activity.  Eating a healthy diet.  Avoiding tobacco and drug use.  Limiting alcohol use.  Practicing safe sex.  Taking  low-dose aspirin every day.  Taking vitamin and mineral supplements as recommended by your health care provider. What happens during an annual well check? The services and screenings done by your health care provider during your annual well check will depend on your age, overall health, lifestyle risk factors, and family history of disease. Counseling  Your health care provider may ask you questions about your:  Alcohol use.  Tobacco use.  Drug use.  Emotional well-being.  Home and relationship well-being.  Sexual activity.  Eating habits.  History of falls.  Memory and ability to understand (cognition).  Work and work Statistician.  Reproductive health. Screening  You may have the following tests or measurements:  Height, weight, and BMI.  Blood pressure.  Lipid and cholesterol levels. These may be checked every 5 years, or more frequently if you are over 73 years old.  Skin check.  Lung cancer screening. You may have this screening every year starting at age 76 if you have a 30-pack-year history of smoking and currently smoke or have quit within the past 15 years.  Fecal occult blood test (FOBT) of the stool. You may have this test every year starting at age 1.  Flexible sigmoidoscopy or colonoscopy. You may have a sigmoidoscopy every 5 years or a colonoscopy every 10 years starting at age 72.  Hepatitis C blood test.  Hepatitis B blood test.  Sexually transmitted disease (  STD) testing.  Diabetes screening. This is done by checking your blood sugar (glucose) after you have not eaten for a while (fasting). You may have this done every 1-3 years.  Bone density scan. This is done to screen for osteoporosis. You may have this done starting at age 63.  Mammogram. This may be done every 1-2 years. Talk to your health care provider about how often you should have regular mammograms. Talk with your health care provider about your test results, treatment options, and  if necessary, the need for more tests. Vaccines  Your health care provider may recommend certain vaccines, such as:  Influenza vaccine. This is recommended every year.  Tetanus, diphtheria, and acellular pertussis (Tdap, Td) vaccine. You may need a Td booster every 10 years.  Zoster vaccine. You may need this after age 24.  Pneumococcal 13-valent conjugate (PCV13) vaccine. One dose is recommended after age 85.  Pneumococcal polysaccharide (PPSV23) vaccine. One dose is recommended after age 40. Talk to your health care provider about which screenings and vaccines you need and how often you need them. This information is not intended to replace advice given to you by your health care provider. Make sure you discuss any questions you have with your health care provider. Document Released: 11/25/2015 Document Revised: 07/18/2016 Document Reviewed: 08/30/2015 Elsevier Interactive Patient Education  2017 [place] Prevention in the Home Falls can cause injuries. They can happen to people of all ages. There are many things you can do to make your home safe and to help prevent falls. What can I do on the outside of my home?  Regularly fix the edges of walkways and driveways and fix any cracks.  Remove anything that might make you trip as you walk through a door, such as a raised step or threshold.  Trim any bushes or trees on the path to your home.  Use bright outdoor lighting.  Clear any walking paths of anything that might make someone trip, such as rocks or tools.  Regularly check to see if handrails are loose or broken. Make sure that both sides of any steps have handrails.  Any raised decks and porches should have guardrails on the edges.  Have any leaves, snow, or ice cleared regularly.  Use sand or salt on walking paths during winter.  Clean up any spills in your garage right away. This includes oil or grease spills. What can I do in the bathroom?  Use night  lights.  Install grab bars by the toilet and in the tub and shower. Do not use towel bars as grab bars.  Use non-skid mats or decals in the tub or shower.  If you need to sit down in the shower, use a plastic, non-slip stool.  Keep the floor dry. Clean up any water that spills on the floor as soon as it happens.  Remove soap buildup in the tub or shower regularly.  Attach bath mats securely with double-sided non-slip rug tape.  Do not have throw rugs and other things on the floor that can make you trip. What can I do in the bedroom?  Use night lights.  Make sure that you have a light by your bed that is easy to reach.  Do not use any sheets or blankets that are too big for your bed. They should not hang down onto the floor.  Have a firm chair that has side arms. You can use this for support while you get dressed.  Do  not have throw rugs and other things on the floor that can make you trip. What can I do in the kitchen?  Clean up any spills right away.  Avoid walking on wet floors.  Keep items that you use a lot in easy-to-reach places.  If you need to reach something above you, use a strong step stool that has a grab bar.  Keep electrical cords out of the way.  Do not use floor polish or wax that makes floors slippery. If you must use wax, use non-skid floor wax.  Do not have throw rugs and other things on the floor that can make you trip. What can I do with my stairs?  Do not leave any items on the stairs.  Make sure that there are handrails on both sides of the stairs and use them. Fix handrails that are broken or loose. Make sure that handrails are as long as the stairways.  Check any carpeting to make sure that it is firmly attached to the stairs. Fix any carpet that is loose or worn.  Avoid having throw rugs at the top or bottom of the stairs. If you do have throw rugs, attach them to the floor with carpet tape.  Make sure that you have a light switch at the  top of the stairs and the bottom of the stairs. If you do not have them, ask someone to add them for you. What else can I do to help prevent falls?  Wear shoes that:  Do not have high heels.  Have rubber bottoms.  Are comfortable and fit you well.  Are closed at the toe. Do not wear sandals.  If you use a stepladder:  Make sure that it is fully opened. Do not climb a closed stepladder.  Make sure that both sides of the stepladder are locked into place.  Ask someone to hold it for you, if possible.  Clearly mark and make sure that you can see:  Any grab bars or handrails.  First and last steps.  Where the edge of each step is.  Use tools that help you move around (mobility aids) if they are needed. These include:  Canes.  Walkers.  Scooters.  Crutches.  Turn on the lights when you go into a dark area. Replace any light bulbs as soon as they burn out.  Set up your furniture so you have a clear path. Avoid moving your furniture around.  If any of your floors are uneven, fix them.  If there are any pets around you, be aware of where they are.  Review your medicines with your doctor. Some medicines can make you feel dizzy. This can increase your chance of falling. Ask your doctor what other things that you can do to help prevent falls. This information is not intended to replace advice given to you by your health care provider. Make sure you discuss any questions you have with your health care provider. Document Released: 08/25/2009 Document Revised: 04/05/2016 Document Reviewed: 12/03/2014 Elsevier Interactive Patient Education  2017 [employer].

## 2020-12-07 NOTE — Progress Notes (Signed)
Subjective:   Carrie Wilson is a 65 y.o. female who presents for an Initial Medicare Annual Wellness Visit.  Review of Systems    No ROS.  Medicare Wellness Virtual Visit.   Cardiac Risk Factors include: advanced age (>32men, >83 women);hypertension;diabetes mellitus     Objective:    There were no vitals filed for this visit. There is no height or weight on file to calculate BMI.  Advanced Directives 12/07/2020 09/08/2017 06/03/2017 10/21/2016  Does Patient Have a Medical Advance Directive? No No No No  Would patient like information on creating a medical advance directive? No - Patient declined - - No - Patient declined    Current Medications (verified) Outpatient Encounter Medications as of 12/07/2020  Medication Sig  . cholecalciferol (VITAMIN D) 1000 units tablet Take 1,000 Units by mouth daily.   No facility-administered encounter medications on file as of 12/07/2020.    Allergies (verified) Codeine sulfate and Penicillins   History: Past Medical History:  Diagnosis Date  . GERD (gastroesophageal reflux disease)    neg H. pylori  . Hyperlipidemia   . Hypertension   . Vitamin D deficiency    Past Surgical History:  Procedure Laterality Date  . CHOLECYSTECTOMY  1994  . rectal fissure repair  2002  . TUBAL LIGATION  1995   Family History  Problem Relation Age of Onset  . Hypertension Mother   . Heart disease Mother   . Hypercholesterolemia Brother   . Colon cancer Neg Hx   . Breast cancer Neg Hx    Social History   Socioeconomic History  . Marital status: Married    Spouse name: Not on file  . Number of children: 1  . Years of education: Not on file  . Highest education level: Not on file  Occupational History  . Not on file  Tobacco Use  . Smoking status: Not on file  . Smokeless tobacco: Never Used  Vaping Use  . Vaping Use: Never used  Substance and Sexual Activity  . Alcohol use: No    Alcohol/week: 0.0 standard drinks  . Drug  use: No  . Sexual activity: Not on file  Other Topics Concern  . Not on file  Social History Narrative  . Not on file   Social Determinants of Health   Financial Resource Strain: Low Risk   . Difficulty of Paying Living Expenses: Not hard at all  Food Insecurity: No Food Insecurity  . Worried About Charity fundraiser in the Last Year: Never true  . Ran Out of Food in the Last Year: Never true  Transportation Needs: No Transportation Needs  . Lack of Transportation (Medical): No  . Lack of Transportation (Non-Medical): No  Physical Activity: Not on file  Stress: No Stress Concern Present  . Feeling of Stress : Not at all  Social Connections: Unknown  . Frequency of Communication with Friends and Family: More than three times a week  . Frequency of Social Gatherings with Friends and Family: More than three times a week  . Attends Religious Services: Not on file  . Active Member of Clubs or Organizations: Not on file  . Attends Archivist Meetings: Not on file  . Marital Status: Not on file    Tobacco Counseling Counseling given: Not Answered   Clinical Intake:  Pre-visit preparation completed: Yes        Diabetes: Yes (Followed by pcp)  How often do you need to have someone help you when  you read instructions, pamphlets, or other written materials from your doctor or pharmacy?: 1 - Never   Interpreter Needed?: No      Activities of Daily Living In your present state of health, do you have any difficulty performing the following activities: 12/07/2020  Hearing? N  Vision? N  Difficulty concentrating or making decisions? N  Walking or climbing stairs? N  Dressing or bathing? N  Doing errands, shopping? N  Preparing Food and eating ? N  Using the Toilet? N  In the past six months, have you accidently leaked urine? N  Do you have problems with loss of bowel control? N  Managing your Medications? N  Managing your Finances? N  Housekeeping or  managing your Housekeeping? N  Some recent data might be hidden    Patient Care Team: Dale Belfast, MD as PCP - General (Internal Medicine)  Indicate any recent Medical Services you may have received from other than [facility] providers in the past year (date may be approximate).     Assessment:   This is a routine wellness examination for Carrie Wilson.  I connected with Carrie Wilson today by telephone and verified that I am speaking with the correct person using two identifiers. Location patient: home Location provider: work Persons participating in the virtual visit: patient, Engineer, civil (consulting).    I discussed the limitations, risks, security and privacy concerns of performing an evaluation and management service by telephone and the availability of in person appointments. The patient expressed understanding and verbally consented to this telephonic visit.    Interactive audio and video telecommunications were attempted between this provider and patient, however failed, due to patient having technical difficulties OR patient did not have access to video capability.  We continued and completed visit with audio only.  Some vital signs may be absent or patient reported.   Hearing/Vision screen  Hearing Screening   125Hz  250Hz  500Hz  1000Hz  2000Hz  3000Hz  4000Hz  6000Hz  8000Hz   Right ear:           Left ear:           Comments: Patient is able to hear conversational tones without difficulty.  No issues reported.  Vision Screening Comments: Wears corrective lenses Visual acuity not assessed, virtual visit.  They have seen their ophthalmologist in the last 12 months.     Dietary issues and exercise activities discussed: Current Exercise Habits: Home exercise routine  Healthy diet Good water intake  Goals    . Maintain Healthy Lifestyle     Stay active Healthy diet       Depression Screen PHQ 2/9 Scores 12/07/2020 08/22/2020 08/19/2019 09/10/2017 09/04/2016 12/19/2015 12/16/2014  PHQ - 2 Score 0 0 0 0 0 0  0    Fall Risk Fall Risk  12/07/2020 08/22/2020 08/19/2019 09/10/2017 09/04/2016  Falls in the past year? 0 0 0 No No  Number falls in past yr: 0 - - - -  Injury with Fall? 0 - - - -  Follow up Falls evaluation completed Falls evaluation completed Falls evaluation completed - -    FALL RISK PREVENTION PERTAINING TO THE HOME: Handrails in use when climbing stairs?Yes Home free of loose throw rugs in walkways, pet beds, electrical cords, etc? Yes  Adequate lighting in your home to reduce risk of falls? Yes   ASSISTIVE DEVICES UTILIZED TO PREVENT FALLS: Use of a cane, walker or w/c? No   TIMED UP AND GO: Was the test performed? No . Virtual visit.   Cognitive Function:  Patient  is alert and oriented x3.   Denies difficulty focusing, making decisions, memory loss.  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  MMSE/6CIT deferred. Normal by direct communication/observation.    Immunizations  There is no immunization history on file for this patient.   Health Maintenance  Topic Date Due  . Hepatitis C Screening  Never done  . COLONOSCOPY (Pts 45-20yrs Insurance coverage will need to be confirmed)  12/15/2014  . FOOT EXAM  08/18/2020  . OPHTHALMOLOGY EXAM  02/10/2021  . URINE MICROALBUMIN  02/16/2021  . HEMOGLOBIN A1C  02/20/2021  . MAMMOGRAM  07/27/2021  . DEXA SCAN  Completed  . INFLUENZA VACCINE  Discontinued  . TETANUS/TDAP  Discontinued  . COVID-19 Vaccine  Discontinued  . PNA vac Low Risk Adult  Discontinued    Health Maintenance  Health Maintenance Due  Topic Date Due  . Hepatitis C Screening  Never done  . COLONOSCOPY (Pts 45-77yrs Insurance coverage will need to be confirmed)  12/15/2014  . FOOT EXAM  08/18/2020    Colorectal cancer screening: Type of screening: Colonoscopy. Completed 12/15/13. Repeat every 10 years. Consult scheduled.  Mammogram status: Completed 07/27/20. Repeat every year.MM 3D SCREEN BREAST BILATERAL   Bone Density status: Completed  12/06/10. Results reflect: Bone density results: NORMAL. Repeat every 5 years. cholecalciferol (VITAMIN D) 1000 units tablet. Deferred at this time.   Lung Cancer Screening: (Low Dose CT Chest recommended if Age 81-80 years, 30 pack-year currently smoking OR have quit w/in 15years.) does qualify. Scheduled 12/21/20.  Hepatitis C Screening: does qualify. Consent given. Ordered placed.   Vision Screening: Recommended annual ophthalmology exams for early detection of glaucoma and other disorders of the eye. Is the patient up to date with their annual eye exam?  Yes   Dental Screening: Recommended annual dental exams for proper oral hygiene.  Community Resource Referral / Chronic Care Management: CRR required this visit?  No   CCM required this visit?  No      Plan:   Keep all routine maintenance appointments.   Follow up 02/20/21 @ 8:30  I have personally reviewed and noted the following in the patient's chart:   . Medical and social history . Use of alcohol, tobacco or illicit drugs  . Current medications and supplements . Functional ability and status . Nutritional status . Physical activity . Advanced directives . List of other physicians . Hospitalizations, surgeries, and ER visits in previous 12 months . Vitals . Screenings to include cognitive, depression, and falls . Referrals and appointments  In addition, I have reviewed and discussed with patient certain preventive protocols, quality metrics, and best practice recommendations. A written personalized care plan for preventive services as well as general preventive health recommendations were provided to patient via mychart.     Varney Biles, LPN   05/11/1600

## 2020-12-15 DIAGNOSIS — Z86010 Personal history of colonic polyps: Secondary | ICD-10-CM | POA: Diagnosis not present

## 2020-12-21 ENCOUNTER — Inpatient Hospital Stay: Payer: Medicare HMO | Attending: Oncology | Admitting: Oncology

## 2020-12-21 ENCOUNTER — Other Ambulatory Visit: Payer: Self-pay

## 2020-12-21 ENCOUNTER — Encounter: Payer: Self-pay | Admitting: Oncology

## 2020-12-21 ENCOUNTER — Ambulatory Visit
Admission: RE | Admit: 2020-12-21 | Discharge: 2020-12-21 | Disposition: A | Discharge status: Home / Self Care | Payer: Medicare HMO | Source: Ambulatory Visit | Attending: Oncology | Admitting: Oncology

## 2020-12-21 DIAGNOSIS — Z122 Encounter for screening for malignant neoplasm of respiratory organs: Secondary | ICD-10-CM | POA: Diagnosis not present

## 2020-12-21 DIAGNOSIS — Z87891 Personal history of nicotine dependence: Secondary | ICD-10-CM

## 2020-12-21 NOTE — Progress Notes (Signed)
Virtual Visit via Video Note  I connected with Carrie Wilson on 12/21/20 at 10:00 AM EST by a video enabled telemedicine application and verified that I am speaking with the correct person using two identifiers.  Location: Patient: [facility] Provider: Clinic    I discussed the limitations of evaluation and management by telemedicine and the availability of in person appointments. The patient expressed understanding and agreed to proceed.  I discussed the assessment and treatment plan with the patient. The patient was provided an opportunity to ask questions and all were answered. The patient agreed with the plan and demonstrated an understanding of the instructions.   The patient was advised to call back or seek an in-person evaluation if the symptoms worsen or if the condition fails to improve as anticipated.   In accordance with CMS guidelines, patient has met eligibility criteria including age, absence of signs or symptoms of lung cancer.  Social History   Tobacco Use  . Smoking status: Former Smoker    Packs/day: 0.50    Years: 40.00    Pack years: 20.00    Types: Cigarettes    Quit date: 02/11/2020    Years since quitting: 0.8  . Smokeless tobacco: Never Used  Vaping Use  . Vaping Use: Never used  Substance Use Topics  . Alcohol use: No    Alcohol/week: 0.0 standard drinks  . Drug use: No      A shared decision-making session was conducted prior to the performance of CT scan. This includes one or more decision aids, includes benefits and harms of screening, follow-up diagnostic testing, over-diagnosis, false positive rate, and total radiation exposure.   Counseling on the importance of adherence to annual lung cancer LDCT screening, impact of co-morbidities, and ability or willingness to undergo diagnosis and treatment is imperative for compliance of the program.   Counseling on the importance of continued smoking cessation for former smokers; the importance of smoking cessation  for current smokers, and information about tobacco cessation interventions have been given to patient including [place] and 1800 quit [place] programs.   Written order for lung cancer screening with LDCT has been given to the patient and any and all questions have been answered to the best of my abilities.    Yearly follow up will be coordinated by Burgess Estelle, Thoracic Navigator.  I provided 15 minutes of face-to-face video visit time during this encounter, and > 50% was spent counseling as documented under my assessment & plan.   Jacquelin Hawking, NP

## 2020-12-23 ENCOUNTER — Encounter: Payer: Self-pay | Admitting: *Deleted

## 2020-12-23 ENCOUNTER — Encounter: Payer: Self-pay | Admitting: Internal Medicine

## 2020-12-23 DIAGNOSIS — I700 Atherosclerosis of aorta: Secondary | ICD-10-CM | POA: Insufficient documentation

## 2021-01-02 ENCOUNTER — Ambulatory Visit
Admission: EM | Admit: 2021-01-02 | Discharge: 2021-01-02 | Disposition: A | Discharge status: Home / Self Care | Payer: Medicare HMO | Attending: Emergency Medicine | Admitting: Emergency Medicine

## 2021-01-02 ENCOUNTER — Encounter: Payer: Self-pay | Admitting: Emergency Medicine

## 2021-01-02 ENCOUNTER — Other Ambulatory Visit: Payer: Self-pay

## 2021-01-02 DIAGNOSIS — J0100 Acute maxillary sinusitis, unspecified: Secondary | ICD-10-CM

## 2021-01-02 MED ORDER — CLINDAMYCIN HCL 300 MG PO CAPS: 300.0000 mg | ORAL_CAPSULE | Freq: Three times a day (TID) | ORAL | 0 refills | Status: AC

## 2021-01-02 NOTE — ED Triage Notes (Signed)
Pt is present today with sinus pressure, cough, and   nasal drainage that started two weeks. Pt states that she tried OTC medication and no relief

## 2021-01-02 NOTE — ED Provider Notes (Signed)
[facility]    CSN: [id number] Arrival date & time: 01/02/21  7673      History   Chief Complaint No chief complaint on file.   HPI Carrie Wilson is a 65 y.o. female.   HPI   65 year old female here for evaluation of sinus pressure, cough, and nasal discharge.  Patient reports that she has been having symptoms for the last 2 weeks.  She has pressure in both cheeks and is not blowing a lot out of her nose but it drains down her throat and then she coughs up green mucus.  She did have a sore throat but that is resolved.  Patient denies fever, ear pain or pressure, shortness of breath, or wheezing.  She has been using a tablespoon of [place] at bedtime for her cough and that has been helping.  Past Medical History:  Diagnosis Date  . GERD (gastroesophageal reflux disease)    neg H. pylori  . Hyperlipidemia   . Hypertension   . Vitamin D deficiency     Patient Active Problem List   Diagnosis Date Noted  . Aortic atherosclerosis ([place]) 12/23/2020  . Weight loss 02/17/2020  . Elevated hemoglobin ([place]) 08/23/2019  . 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 Diabetes mellitus without complication ([place]) 41/93/7902  . Orbital mass 06/12/2018  . Left hip pain 06/16/2015  . Left knee pain 12/16/2014  . Health care maintenance 12/16/2014  . Tobacco use 06/20/2014  . GERD (gastroesophageal reflux disease) 12/10/2012  . Hypertension 12/10/2012  . Vitamin D deficiency 12/10/2012  . Hypercholesterolemia 12/08/2012    Past Surgical History:  Procedure Laterality Date  . CHOLECYSTECTOMY  1994  . rectal fissure repair  2002  . TUBAL LIGATION  1995    OB History   No obstetric history on file.      Home Medications    Prior to Admission medications   Medication Sig Start Date End Date Taking? Authorizing Provider  clindamycin (CLEOCIN) 300 MG capsule Take 1 capsule (300 mg total) by mouth 3 (three) times daily for 10 days. 01/02/21  01/12/21 Yes Margarette Canada, NP  cholecalciferol (VITAMIN D) 1000 units tablet Take 1,000 Units by mouth daily.    [provider]    Family History Family History  Problem Relation Age of Onset  . Hypertension Mother   . Heart disease Mother   . Hypercholesterolemia Brother   . Colon cancer Neg Hx   . Breast cancer Neg Hx     Social History Social History   Tobacco Use  . Smoking status: Former Smoker    Packs/day: 0.50    Years: 40.00    Pack years: 20.00    Types: Cigarettes    Quit date: 02/11/2020    Years since quitting: 0.8  . Smokeless tobacco: Never Used  Vaping Use  . Vaping Use: Never used  Substance Use Topics  . Alcohol use: No    Alcohol/week: 0.0 standard drinks  . Drug use: No     Allergies   Codeine sulfate and Penicillins   Review of Systems Review of Systems  Constitutional: Negative for activity change and fever.  HENT: Positive for congestion, sinus pressure, sinus pain and sore throat. Negative for ear pain and rhinorrhea.   Respiratory: Positive for cough. Negative for shortness of breath.   Gastrointestinal: Negative for diarrhea, nausea and vomiting.  Hematological: Negative.   Psychiatric/Behavioral: Negative.      Physical Exam Triage Vital Signs ED Triage Vitals  Enc Vitals Group     BP 01/02/21 1041 138/70     Pulse Rate 01/02/21 1041 82     Resp 01/02/21 1041 18     Temp 01/02/21 1041 98.4 F (36.9 C)     Temp Source 01/02/21 1041 Oral     SpO2 01/02/21 1041 99 %     Weight --      Height --      Head Circumference --      Peak Flow --      Pain Score 01/02/21 1039 4     Pain Loc --      Pain Edu? --      Excl. in [place]? --    No data found.  Updated Vital Signs BP 138/70 (BP Location: Left Arm)   Pulse 82   Temp 98.4 F (36.9 C) (Oral)   Resp 18   LMP 12/08/1997   SpO2 99%   Visual Acuity Right Eye Distance:   Left Eye Distance:   Bilateral Distance:    Right Eye Near:   Left Eye Near:    Bilateral  Near:     Physical Exam Vitals and nursing note reviewed.  Constitutional:      General: She is not in acute distress.    Appearance: Normal appearance. She is normal weight. She is not ill-appearing.  HENT:     Head: Normocephalic and atraumatic.     Right Ear: Tympanic membrane, ear canal and external ear normal.     Left Ear: Tympanic membrane, ear canal and external ear normal.     Nose: Congestion present. No rhinorrhea.     Comments: His mucosa is edematous and mildly erythematous without nasal discharge.  Patient has mild tenderness to maxillary sinuses upon percussion.    Mouth/Throat:     Mouth: Mucous membranes are moist.     Pharynx: Oropharynx is clear. No posterior oropharyngeal erythema.  Cardiovascular:     Rate and Rhythm: Normal rate and regular rhythm.     Pulses: Normal pulses.     Heart sounds: Normal heart sounds. No murmur heard. No gallop.   Pulmonary:     Effort: Pulmonary effort is normal.     Breath sounds: Normal breath sounds. No wheezing, rhonchi or rales.  Musculoskeletal:     Cervical back: Normal range of motion and neck supple.  Lymphadenopathy:     Cervical: No cervical adenopathy.  Skin:    General: Skin is warm and dry.     Capillary Refill: Capillary refill takes less than 2 seconds.     Findings: No erythema or rash.  Neurological:     General: No focal deficit present.     Mental Status: She is alert and oriented to person, place, and time.  Psychiatric:        Mood and Affect: Mood normal.        Behavior: Behavior normal.        Thought Content: Thought content normal.        Judgment: Judgment normal.      UC Treatments / Results  Labs (all labs ordered are listed, but only abnormal results are displayed) Labs Reviewed - No data to display  EKG   Radiology No results found.  Procedures Procedures (including critical care time)  Medications Ordered in UC Medications - No data to display  Initial Impression /  Assessment and Plan / UC Course  I have reviewed the triage vital signs and the nursing notes.  Pertinent labs & imaging results that were available during my care of the patient were reviewed by me and considered in my medical decision making (see chart for details).   Patient is a very pleasant 65 year old female here for sinus trouble this been going on for the past 2 weeks.  Patient reports that she is not getting a lot of discharge out of her nose but it drains out her throat and that she coughs up green mucus.  Patient has pain and pressure in both cheeks and both maxillary sinuses are tender to percussion.  Nasal mucosa is mildly erythematous and edematous without discharge.  Patient's lungs are clear to auscultation all fields.  Due to the protracted nature of patient's symptoms will treat for maxillary sinusitis.  Patient states that she has an allergy to penicillin and usually gets clindamycin for her sinus infections which is helpful.  Patient has been using [place] for her cough with good resolution of symptoms.   Final Clinical Impressions(s) / UC Diagnoses   Final diagnoses:  Acute non-recurrent maxillary sinusitis     Discharge Instructions     The clindamycin 3 times a day for 10 days for treatment of your sinusitis.  Take an over-the-counter probiotic such as, Culturelle-align-activa, hour after each dose of antibiotic to prevent diarrhea from occurring.  Perform sinus irrigation 2-3 times a day to help resolve the infection.  Return for reevaluation, or see your primary care provider, for any new or worsening symptoms.    ED Prescriptions    Medication Sig Dispense Auth. Provider   clindamycin (CLEOCIN) 300 MG capsule Take 1 capsule (300 mg total) by mouth 3 (three) times daily for 10 days. 30 capsule Margarette Canada, NP     PDMP not reviewed this encounter.   Margarette Canada, NP 01/02/21 1104

## 2021-01-02 NOTE — Discharge Instructions (Addendum)
The clindamycin 3 times a day for 10 days for treatment of your sinusitis.  Take an over-the-counter probiotic such as, Culturelle-align-activa, hour after each dose of antibiotic to prevent diarrhea from occurring.  Perform sinus irrigation 2-3 times a day to help resolve the infection.  Return for reevaluation, or see your primary care provider, for any new or worsening symptoms.

## 2021-01-30 DIAGNOSIS — Z01818 Encounter for other preprocedural examination: Secondary | ICD-10-CM | POA: Diagnosis not present

## 2021-02-02 DIAGNOSIS — Z86010 Personal history of colonic polyps: Secondary | ICD-10-CM | POA: Diagnosis not present

## 2021-02-02 DIAGNOSIS — K640 First degree hemorrhoids: Secondary | ICD-10-CM | POA: Diagnosis not present

## 2021-02-02 LAB — HM COLONOSCOPY

## 2021-02-20 ENCOUNTER — Ambulatory Visit (INDEPENDENT_AMBULATORY_CARE_PROVIDER_SITE_OTHER): Payer: Medicare HMO | Admitting: Internal Medicine

## 2021-02-20 ENCOUNTER — Encounter: Payer: Self-pay | Admitting: Internal Medicine

## 2021-02-20 ENCOUNTER — Other Ambulatory Visit: Payer: Self-pay

## 2021-02-20 VITALS — BP 118/70 | HR 74 | Temp 98.1°F | Resp 16 | Ht 68.0 in | Wt 172.0 lb

## 2021-02-20 DIAGNOSIS — E7800 Pure hypercholesterolemia, unspecified: Secondary | ICD-10-CM

## 2021-02-20 DIAGNOSIS — D582 Other hemoglobinopathies: Secondary | ICD-10-CM | POA: Diagnosis not present

## 2021-02-20 DIAGNOSIS — I10 Essential (primary) hypertension: Secondary | ICD-10-CM

## 2021-02-20 DIAGNOSIS — E119 Type 2 diabetes mellitus without complications: Secondary | ICD-10-CM

## 2021-02-20 DIAGNOSIS — E1159 Type 2 diabetes mellitus with other circulatory complications: Secondary | ICD-10-CM

## 2021-02-20 DIAGNOSIS — H0510 Unspecified chronic inflammatory disorders of orbit: Secondary | ICD-10-CM

## 2021-02-20 DIAGNOSIS — E559 Vitamin D deficiency, unspecified: Secondary | ICD-10-CM | POA: Diagnosis not present

## 2021-02-20 DIAGNOSIS — I700 Atherosclerosis of aorta: Secondary | ICD-10-CM

## 2021-02-20 LAB — LIPID PANEL
Cholesterol: 169 mg/dL (ref 0–200)
HDL: 41.7 mg/dL (ref >39.00)
LDL Cholesterol: 105 mg/dL — ABNORMAL HIGH (ref 0–99)
NonHDL: 127.3
Total CHOL/HDL Ratio: 4
Triglycerides: 111 mg/dL (ref 0.0–149.0)
VLDL: 22.2 mg/dL (ref 0.0–40.0)

## 2021-02-20 LAB — CBC WITH DIFFERENTIAL/PLATELET
Basophils Absolute: 0.1 10*3/uL (ref 0.0–0.1)
Basophils Relative: 0.8 % (ref 0.0–3.0)
Eosinophils Absolute: 0.1 10*3/uL (ref 0.0–0.7)
Eosinophils Relative: 1.5 % (ref 0.0–5.0)
HCT: 43.2 % (ref 36.0–46.0)
Hemoglobin: 14.3 g/dL (ref 12.0–15.0)
Lymphocytes Relative: 30.2 % (ref 12.0–46.0)
Lymphs Abs: 1.9 10*3/uL (ref 0.7–4.0)
MCHC: 33 g/dL (ref 30.0–36.0)
MCV: 92.3 fl (ref 78.0–100.0)
Monocytes Absolute: 0.5 10*3/uL (ref 0.1–1.0)
Monocytes Relative: 7.9 % (ref 3.0–12.0)
Neutro Abs: 3.7 10*3/uL (ref 1.4–7.7)
Neutrophils Relative %: 59.6 % (ref 43.0–77.0)
Platelets: 191 10*3/uL (ref 150.0–400.0)
RBC: 4.68 Mil/uL (ref 3.87–5.11)
RDW: 13.6 % (ref 11.5–15.5)
WBC: 6.3 10*3/uL (ref 4.0–10.5)

## 2021-02-20 LAB — BASIC METABOLIC PANEL
BUN: 9 mg/dL (ref 6–23)
CO2: 31 mEq/L (ref 19–32)
Calcium: 8.9 mg/dL (ref 8.4–10.5)
Chloride: 107 mEq/L (ref 96–112)
Creatinine, Ser: 0.81 mg/dL (ref 0.40–1.20)
GFR: 75.7 mL/min (ref >60.00)
Glucose, Bld: 89 mg/dL (ref 70–99)
Potassium: 4.3 mEq/L (ref 3.5–5.1)
Sodium: 144 mEq/L (ref 135–145)

## 2021-02-20 LAB — HEPATIC FUNCTION PANEL
ALT: 10 U/L (ref 0–35)
AST: 12 U/L (ref 0–37)
Albumin: 3.8 g/dL (ref 3.5–5.2)
Alkaline Phosphatase: 69 U/L (ref 39–117)
Bilirubin, Direct: 0.1 mg/dL (ref 0.0–0.3)
Total Bilirubin: 0.5 mg/dL (ref 0.2–1.2)
Total Protein: 6 g/dL (ref 6.0–8.3)

## 2021-02-20 LAB — HEMOGLOBIN A1C: Hgb A1c MFr Bld: 6.1 % (ref 4.6–6.5)

## 2021-02-20 LAB — TSH: TSH: 2.03 u[IU]/mL (ref 0.35–4.50)

## 2021-02-20 LAB — HM DIABETES FOOT EXAM

## 2021-02-20 NOTE — Progress Notes (Signed)
Patient ID: Katanya Krasnow, female   DOB: 12-21-1955, 65 y.o.   MRN: [id number]   Subjective:    Patient ID: [name], female    DOB: Feb 20, 1956, 65 y.o.   MRN: [id number]  HPI This visit occurred during the SARS-CoV-2 public health emergency.  Safety protocols were in place, including screening questions prior to the visit, additional usage of staff PPE, and extensive cleaning of exam room while observing appropriate contact time as indicated for disinfecting solutions.  Patient here for a scheduled follow up.  Here to follow up regarding her blood pressure and cholesterol.  She reports she is doing well. Feels good.  No chest pain or sob with increased activity or exertion.  Stopped smoking.  Quit after last visit.  No acid reflux reported.  No nausea or vomiting.  Bowels moving.  Off prednisone and MTX.  Has f/u with rheumatology end of month.  Discussed recent CT scan and recommendation for cholesterol medication.    Past Medical History:  Diagnosis Date  . GERD (gastroesophageal reflux disease)    neg H. pylori  . Hyperlipidemia   . Hypertension   . Vitamin D deficiency    Past Surgical History:  Procedure Laterality Date  . CHOLECYSTECTOMY  1994  . rectal fissure repair  2002  . TUBAL LIGATION  1995   Family History  Problem Relation Age of Onset  . Hypertension Mother   . Heart disease Mother   . Hypercholesterolemia Brother   . Colon cancer Neg Hx   . Breast cancer Neg Hx    Social History   Socioeconomic History  . Marital status: Married    Spouse name: Not on file  . Number of children: 1  . Years of education: Not on file  . Highest education level: Not on file  Occupational History  . Not on file  Tobacco Use  . Smoking status: Former Smoker    Packs/day: 0.50    Years: 40.00    Pack years: 20.00    Types: Cigarettes    Quit date: 02/11/2020    Years since quitting: 1.0  . Smokeless tobacco: Never Used  Vaping Use  . Vaping Use:  Never used  Substance and Sexual Activity  . Alcohol use: No    Alcohol/week: 0.0 standard drinks  . Drug use: No  . Sexual activity: Not on file  Other Topics Concern  . Not on file  Social History Narrative  . Not on file   Social Determinants of Health   Financial Resource Strain: Low Risk   . Difficulty of Paying Living Expenses: Not hard at all  Food Insecurity: No Food Insecurity  . Worried About Programme researcher, broadcasting/film/video in the Last Year: Never true  . Ran Out of Food in the Last Year: Never true  Transportation Needs: No Transportation Needs  . Lack of Transportation (Medical): No  . Lack of Transportation (Non-Medical): No  Physical Activity: Not on file  Stress: No Stress Concern Present  . Feeling of Stress : Not at all  Social Connections: Unknown  . Frequency of Communication with Friends and Family: More than three times a week  . Frequency of Social Gatherings with Friends and Family: More than three times a week  . Attends Religious Services: Not on file  . Active Member of Clubs or Organizations: Not on file  . Attends Banker Meetings: Not on file  . Marital Status: Not on file    Outpatient Encounter  Medications as of 02/20/2021  Medication Sig  . cholecalciferol (VITAMIN D) 1000 units tablet Take 1,000 Units by mouth daily.   No facility-administered encounter medications on file as of 02/20/2021.    Review of Systems  Constitutional: Negative for appetite change and unexpected weight change.  HENT: Negative for congestion and sinus pressure.   Respiratory: Negative for cough, chest tightness and shortness of breath.   Cardiovascular: Negative for chest pain, palpitations and leg swelling.  Gastrointestinal: Negative for abdominal pain, diarrhea, nausea and vomiting.  Genitourinary: Negative for difficulty urinating and dysuria.  Musculoskeletal: Negative for joint swelling and myalgias.  Skin: Negative for color change and rash.   Neurological: Negative for dizziness, light-headedness and headaches.  Psychiatric/Behavioral: Negative for agitation and dysphoric mood.       Objective:    Physical Exam Vitals reviewed.  Constitutional:      General: She is not in acute distress.    Appearance: Normal appearance.  HENT:     Head: Normocephalic and atraumatic.     Right Ear: External ear normal.     Left Ear: External ear normal.  Eyes:     General: No scleral icterus.       Right eye: No discharge.        Left eye: No discharge.     Conjunctiva/sclera: Conjunctivae normal.  Neck:     Thyroid: No thyromegaly.  Cardiovascular:     Rate and Rhythm: Normal rate and regular rhythm.  Pulmonary:     Effort: No respiratory distress.     Breath sounds: Normal breath sounds. No wheezing.  Abdominal:     General: Bowel sounds are normal.     Palpations: Abdomen is soft.     Tenderness: There is no abdominal tenderness.  Musculoskeletal:        General: No swelling or tenderness.     Cervical back: Neck supple. No tenderness.  Lymphadenopathy:     Cervical: No cervical adenopathy.  Skin:    Findings: No erythema or rash.  Neurological:     Mental Status: She is alert.  Psychiatric:        Mood and Affect: Mood normal.        Behavior: Behavior normal.     BP 118/70   Pulse 74   Temp 98.1 F (36.7 C) (Oral)   Resp 16   Ht 5\' 8"  (1.727 m)   Wt 172 lb (78 kg)   LMP 12/08/1997   SpO2 97%   BMI 26.15 kg/m  Wt Readings from Last 3 Encounters:  02/20/21 172 lb (78 kg)  12/21/20 167 lb (75.8 kg)  12/07/20 165 lb (74.8 kg)     Lab Results  Component Value Date   WBC 6.3 02/20/2021   HGB 14.3 02/20/2021   HCT 43.2 02/20/2021   PLT 191.0 02/20/2021   GLUCOSE 89 02/20/2021   CHOL 169 02/20/2021   TRIG 111.0 02/20/2021   HDL 41.70 02/20/2021   LDLDIRECT 110.0 06/18/2018   LDLCALC 105 (H) 02/20/2021   ALT 10 02/20/2021   AST 12 02/20/2021   NA 144 02/20/2021   K 4.3 02/20/2021   CL 107  02/20/2021   CREATININE 0.81 02/20/2021   BUN 9 02/20/2021   CO2 31 02/20/2021   TSH 2.03 02/20/2021   HGBA1C 6.1 02/20/2021   MICROALBUR 1.1 02/17/2020       Assessment & Plan:   Problem List Items Addressed This Visit    Aortic atherosclerosis (HCC)    Found  on recent scan.  Discussed recommendation to start statin medication.       Controlled type 2 diabetes mellitus with circulatory disorder (HCC)    Low carb diet and exercise.  Sugars better off prednisone.  Check met b and a1c.       Elevated hemoglobin (HCC)    Last hgb wnl.       Hypercholesterolemia    Discussed recommendation to start cholesterol medication.  Discussed recent scan and aortic atherosclerosis.  Check lipid panel today.  Continue low cholesterol diet and exercise.       Relevant Orders   CBC with Differential/Platelet (Completed)   Hepatic function panel (Completed)   Lipid panel (Completed)   TSH (Completed)   Basic metabolic panel (Completed)   Hypertension - Primary    On no medication. Recheck bp today - slightly elevated.  Have her spot check pressure.  Daughter is a Engineer, civil (consulting).  Send in readings.  Schedule f/u to reassess.  Check metabolic panel.       Idiopathic orbital inflammatory syndrome    Off prednisone and MTX now.  Due to f/u with rheumatology end of month.  Follow.       Vitamin D deficiency    Continue vitamin D supplements.           Dale [place], MD

## 2021-02-21 ENCOUNTER — Encounter: Payer: Self-pay | Admitting: Internal Medicine

## 2021-02-21 NOTE — Assessment & Plan Note (Signed)
Continue vitamin D supplements.  

## 2021-02-21 NOTE — Assessment & Plan Note (Signed)
Found on recent scan.  Discussed recommendation to start statin medication.

## 2021-02-21 NOTE — Assessment & Plan Note (Signed)
On no medication. Recheck bp today - slightly elevated.  Have her spot check pressure.  Daughter is a Marine scientist.  Send in readings.  Schedule f/u to reassess.  Check metabolic panel.

## 2021-02-21 NOTE — Assessment & Plan Note (Addendum)
Discussed recommendation to start cholesterol medication.  Discussed recent scan and aortic atherosclerosis.  Check lipid panel today.  Continue low cholesterol diet and exercise.

## 2021-02-21 NOTE — Assessment & Plan Note (Signed)
Last hgb wnl.

## 2021-02-21 NOTE — Assessment & Plan Note (Signed)
Off prednisone and MTX now.  Due to f/u with rheumatology end of month.  Follow.

## 2021-02-21 NOTE — Assessment & Plan Note (Signed)
Low carb diet and exercise.  Sugars better off prednisone.  Check met b and a1c.

## 2021-02-23 ENCOUNTER — Telehealth: Payer: Self-pay | Admitting: Internal Medicine

## 2021-02-23 ENCOUNTER — Other Ambulatory Visit: Payer: Self-pay

## 2021-02-23 MED ORDER — ROSUVASTATIN CALCIUM 10 MG PO TABS: 10.0000 mg | ORAL_TABLET | ORAL | 2 refills | Status: DC

## 2021-02-23 NOTE — Telephone Encounter (Signed)
Pt called returning a call about lab work.  Please call pt back on her cell #

## 2021-02-23 NOTE — Telephone Encounter (Signed)
See result note.  

## 2021-04-24 ENCOUNTER — Ambulatory Visit (INDEPENDENT_AMBULATORY_CARE_PROVIDER_SITE_OTHER): Payer: Medicare HMO | Admitting: Internal Medicine

## 2021-04-24 ENCOUNTER — Other Ambulatory Visit: Payer: Self-pay

## 2021-04-24 DIAGNOSIS — E7800 Pure hypercholesterolemia, unspecified: Secondary | ICD-10-CM | POA: Diagnosis not present

## 2021-04-24 DIAGNOSIS — H0589 Other disorders of orbit: Secondary | ICD-10-CM

## 2021-04-24 DIAGNOSIS — J439 Emphysema, unspecified: Secondary | ICD-10-CM | POA: Diagnosis not present

## 2021-04-24 DIAGNOSIS — I10 Essential (primary) hypertension: Secondary | ICD-10-CM | POA: Diagnosis not present

## 2021-04-24 DIAGNOSIS — I700 Atherosclerosis of aorta: Secondary | ICD-10-CM

## 2021-04-24 DIAGNOSIS — E1159 Type 2 diabetes mellitus with other circulatory complications: Secondary | ICD-10-CM

## 2021-04-24 NOTE — Progress Notes (Signed)
Patient ID: Carrie Wilson, female   DOB: 1956-03-29, 65 y.o.   MRN: [id number]   Subjective:    Patient ID: Carrie Wilson, female    DOB: 03-Aug-1956, 65 y.o.   MRN: [id number]  HPI This visit occurred during the SARS-CoV-2 public health emergency.  Safety protocols were in place, including screening questions prior to the visit, additional usage of staff PPE, and extensive cleaning of exam room while observing appropriate contact time as indicated for disinfecting solutions.   Patient here for a scheduled follow up. Here to follow up regarding her cholesterol, blood sugar and blood pressure.  She reports she is doing well.  Stays active.  No chest pain or sob reported.  No abdominal pain or bowel change reported.  Now that she is off prednisone, sugars improved.  Handling stress.  Recently started on cholesterol medication.  Did not tolerate - ankle pain, shoulder pain and diarrhea.  Resolved after stopping.  Not smoking.     Past Medical History:  Diagnosis Date   GERD (gastroesophageal reflux disease)    neg H. pylori   Hyperlipidemia    Hypertension    Vitamin D deficiency    Past Surgical History:  Procedure Laterality Date   CHOLECYSTECTOMY  1994   rectal fissure repair  2002   TUBAL LIGATION  1995   Family History  Problem Relation Age of Onset   Hypertension Mother    Heart disease Mother    Hypercholesterolemia Brother    Colon cancer Neg Hx    Breast cancer Neg Hx    Social History   Socioeconomic History   Marital status: Married    Spouse name: Not on file   Number of children: 1   Years of education: Not on file   Highest education level: Not on file  Occupational History   Not on file  Tobacco Use   Smoking status: Former    Packs/day: 0.50    Years: 40.00    Pack years: 20.00    Types: Cigarettes    Quit date: 02/11/2020    Years since quitting: 1.2   Smokeless tobacco: Never  Vaping Use   Vaping Use: Never used  Substance and Sexual  Activity   Alcohol use: No    Alcohol/week: 0.0 standard drinks   Drug use: No   Sexual activity: Not on file  Other Topics Concern   Not on file  Social History Narrative   Not on file   Social Determinants of Health   Financial Resource Strain: Low Risk    Difficulty of Paying Living Expenses: Not hard at all  Food Insecurity: No Food Insecurity   Worried About Programme researcher, broadcasting/film/video in the Last Year: Never true   Ran Out of Food in the Last Year: Never true  Transportation Needs: No Transportation Needs   Lack of Transportation (Medical): No   Lack of Transportation (Non-Medical): No  Physical Activity: Not on file  Stress: No Stress Concern Present   Feeling of Stress : Not at all  Social Connections: Unknown   Frequency of Communication with Friends and Family: More than three times a week   Frequency of Social Gatherings with Friends and Family: More than three times a week   Attends Religious Services: Not on file   Active Member of Clubs or Organizations: Not on file   Attends Banker Meetings: Not on file   Marital Status: Not on file    Outpatient Encounter Medications as  of 04/24/2021  Medication Sig   cholecalciferol (VITAMIN D) 1000 units tablet Take 1,000 Units by mouth daily.   [DISCONTINUED] rosuvastatin (CRESTOR) 10 MG tablet Take 1 tablet (10 mg total) by mouth 2 (two) times a week. (Patient not taking: Reported on 04/24/2021)   No facility-administered encounter medications on file as of 04/24/2021.     Review of Systems  Constitutional:  Negative for appetite change and unexpected weight change.  HENT:  Negative for congestion and sinus pressure.   Respiratory:  Negative for cough, chest tightness and shortness of breath.   Cardiovascular:  Negative for chest pain and palpitations.  Gastrointestinal:  Negative for abdominal pain, diarrhea, nausea and vomiting.  Genitourinary:  Negative for difficulty urinating and dysuria.  Musculoskeletal:   Negative for joint swelling and myalgias.  Skin:  Negative for color change and rash.  Neurological:  Negative for dizziness, light-headedness and headaches.  Psychiatric/Behavioral:  Negative for agitation and dysphoric mood.       Objective:    Physical Exam Vitals reviewed.  Constitutional:      General: She is not in acute distress.    Appearance: Normal appearance.  HENT:     Head: Normocephalic and atraumatic.     Right Ear: External ear normal.     Left Ear: External ear normal.  Eyes:     General: No scleral icterus.       Right eye: No discharge.        Left eye: No discharge.     Conjunctiva/sclera: Conjunctivae normal.  Neck:     Thyroid: No thyromegaly.  Cardiovascular:     Rate and Rhythm: Normal rate and regular rhythm.  Pulmonary:     Effort: No respiratory distress.     Breath sounds: Normal breath sounds. No wheezing.  Abdominal:     General: Bowel sounds are normal.     Palpations: Abdomen is soft.     Tenderness: There is no abdominal tenderness.  Musculoskeletal:        General: No swelling or tenderness.     Cervical back: Neck supple. No tenderness.  Lymphadenopathy:     Cervical: No cervical adenopathy.  Skin:    Findings: No erythema or rash.  Neurological:     Mental Status: She is alert.  Psychiatric:        Mood and Affect: Mood normal.        Behavior: Behavior normal.    BP 124/76   Pulse 95   Temp 98.1 F (36.7 C)   Resp 16   Ht 5\' 8"  (1.727 m)   Wt 175 lb (79.4 kg)   LMP 12/08/1997   SpO2 99%   BMI 26.61 kg/m  Wt Readings from Last 3 Encounters:  04/24/21 175 lb (79.4 kg)  02/20/21 172 lb (78 kg)  12/21/20 167 lb (75.8 kg)     Lab Results  Component Value Date   WBC 6.3 02/20/2021   HGB 14.3 02/20/2021   HCT 43.2 02/20/2021   PLT 191.0 02/20/2021   GLUCOSE 89 02/20/2021   CHOL 169 02/20/2021   TRIG 111.0 02/20/2021   HDL 41.70 02/20/2021   LDLDIRECT 110.0 06/18/2018   LDLCALC 105 (H) 02/20/2021   ALT 10  02/20/2021   AST 12 02/20/2021   NA 144 02/20/2021   K 4.3 02/20/2021   CL 107 02/20/2021   CREATININE 0.81 02/20/2021   BUN 9 02/20/2021   CO2 31 02/20/2021   TSH 2.03 02/20/2021   HGBA1C 6.1 02/20/2021  MICROALBUR 1.1 02/17/2020       Assessment & Plan:   Problem List Items Addressed This Visit     Aortic atherosclerosis (HCC)    Was on crestor.  Did not tolerate.  Follow.         Controlled type 2 diabetes mellitus with circulatory disorder (HCC)    Sugars better off prednisone.  Check met b and a1c.  Low carb diet and exercise.         Relevant Orders   Hemoglobin A1c   Microalbumin / creatinine urine ratio   Emphysema lung (HCC)    Found on recent CT.  Has stopped smoking.  Breathing stable.  Follow.         Hypercholesterolemia    Intolerant to crestor.  Low cholesterol diet and exercise. Follow lipid panel.         Relevant Orders   Hepatic function panel   Lipid panel   Hypertension    Blood pressure doing well.  On no medication.  Follow pressures.  Follow metabolic panel.        Relevant Orders   Basic metabolic panel   Orbital mass    S/p orbitomy.  Followed by ophthalmology and rheumatology.  Off MTX.  Follow.           Dale Norton, MD

## 2021-04-30 ENCOUNTER — Encounter: Payer: Self-pay | Admitting: Internal Medicine

## 2021-04-30 DIAGNOSIS — J439 Emphysema, unspecified: Secondary | ICD-10-CM | POA: Insufficient documentation

## 2021-04-30 NOTE — Assessment & Plan Note (Signed)
S/p orbitomy.  Followed by ophthalmology and rheumatology.  Off MTX.  Follow.

## 2021-04-30 NOTE — Assessment & Plan Note (Signed)
Sugars better off prednisone.  Check met b and a1c.  Low carb diet and exercise.   

## 2021-04-30 NOTE — Assessment & Plan Note (Signed)
Blood pressure doing well.  On no medication.  Follow pressures.  Follow metabolic panel.

## 2021-04-30 NOTE — Assessment & Plan Note (Signed)
Intolerant to crestor.  Low cholesterol diet and exercise. Follow lipid panel.

## 2021-04-30 NOTE — Assessment & Plan Note (Signed)
Found on recent CT.  Has stopped smoking.  Breathing stable.  Follow.

## 2021-04-30 NOTE — Assessment & Plan Note (Signed)
Was on crestor.  Did not tolerate.  Follow.

## 2021-07-13 ENCOUNTER — Other Ambulatory Visit: Payer: Self-pay | Admitting: Internal Medicine

## 2021-07-13 DIAGNOSIS — Z1231 Encounter for screening mammogram for malignant neoplasm of breast: Secondary | ICD-10-CM

## 2021-08-01 DIAGNOSIS — G459 Transient cerebral ischemic attack, unspecified: Secondary | ICD-10-CM | POA: Diagnosis not present

## 2021-08-01 DIAGNOSIS — I6523 Occlusion and stenosis of bilateral carotid arteries: Secondary | ICD-10-CM | POA: Diagnosis not present

## 2021-08-01 DIAGNOSIS — Z885 Allergy status to narcotic agent status: Secondary | ICD-10-CM | POA: Diagnosis not present

## 2021-08-01 DIAGNOSIS — R69 Illness, unspecified: Secondary | ICD-10-CM | POA: Diagnosis not present

## 2021-08-01 DIAGNOSIS — Z7982 Long term (current) use of aspirin: Secondary | ICD-10-CM | POA: Diagnosis not present

## 2021-08-01 DIAGNOSIS — Z6825 Body mass index (BMI) 25.0-25.9, adult: Secondary | ICD-10-CM | POA: Diagnosis not present

## 2021-08-01 DIAGNOSIS — R200 Anesthesia of skin: Secondary | ICD-10-CM | POA: Diagnosis not present

## 2021-08-01 DIAGNOSIS — I10 Essential (primary) hypertension: Secondary | ICD-10-CM | POA: Diagnosis not present

## 2021-08-01 DIAGNOSIS — I4510 Unspecified right bundle-branch block: Secondary | ICD-10-CM | POA: Diagnosis not present

## 2021-08-01 DIAGNOSIS — Z880 Allergy status to penicillin: Secondary | ICD-10-CM | POA: Diagnosis not present

## 2021-08-01 DIAGNOSIS — Z8673 Personal history of transient ischemic attack (TIA), and cerebral infarction without residual deficits: Secondary | ICD-10-CM

## 2021-08-01 DIAGNOSIS — E119 Type 2 diabetes mellitus without complications: Secondary | ICD-10-CM | POA: Diagnosis not present

## 2021-08-01 DIAGNOSIS — R531 Weakness: Secondary | ICD-10-CM | POA: Diagnosis not present

## 2021-08-01 DIAGNOSIS — Z7902 Long term (current) use of antithrombotics/antiplatelets: Secondary | ICD-10-CM | POA: Diagnosis not present

## 2021-08-01 HISTORY — DX: Personal history of transient ischemic attack (TIA), and cerebral infarction without residual deficits: Z86.73

## 2021-08-01 LAB — HEMOGLOBIN A1C: Hemoglobin A1C: 5.9

## 2021-08-02 DIAGNOSIS — G459 Transient cerebral ischemic attack, unspecified: Secondary | ICD-10-CM | POA: Diagnosis not present

## 2021-08-02 DIAGNOSIS — I6523 Occlusion and stenosis of bilateral carotid arteries: Secondary | ICD-10-CM | POA: Diagnosis not present

## 2021-08-03 ENCOUNTER — Other Ambulatory Visit: Payer: Self-pay

## 2021-08-03 ENCOUNTER — Ambulatory Visit
Admission: RE | Admit: 2021-08-03 | Discharge: 2021-08-03 | Disposition: A | Discharge status: Home / Self Care | Payer: Medicare HMO | Source: Ambulatory Visit | Attending: Internal Medicine | Admitting: Internal Medicine

## 2021-08-03 DIAGNOSIS — Z1231 Encounter for screening mammogram for malignant neoplasm of breast: Secondary | ICD-10-CM | POA: Diagnosis not present

## 2021-08-13 ENCOUNTER — Other Ambulatory Visit: Payer: Self-pay | Admitting: Internal Medicine

## 2021-08-13 DIAGNOSIS — R928 Other abnormal and inconclusive findings on diagnostic imaging of breast: Secondary | ICD-10-CM

## 2021-08-13 NOTE — Progress Notes (Signed)
Order placed for f/u left breast mammogram and ultrasound.   

## 2021-08-18 ENCOUNTER — Ambulatory Visit
Admission: RE | Admit: 2021-08-18 | Discharge: 2021-08-18 | Disposition: A | Discharge status: Home / Self Care | Payer: Medicare HMO | Source: Ambulatory Visit | Attending: Internal Medicine | Admitting: Internal Medicine

## 2021-08-18 ENCOUNTER — Other Ambulatory Visit: Payer: Self-pay

## 2021-08-18 DIAGNOSIS — R928 Other abnormal and inconclusive findings on diagnostic imaging of breast: Secondary | ICD-10-CM | POA: Diagnosis not present

## 2021-08-18 DIAGNOSIS — R922 Inconclusive mammogram: Secondary | ICD-10-CM | POA: Diagnosis not present

## 2021-08-22 ENCOUNTER — Telehealth: Payer: Self-pay

## 2021-08-22 NOTE — Telephone Encounter (Signed)
LMTCB

## 2021-08-22 NOTE — Telephone Encounter (Signed)
-----   Message from Einar Pheasant, MD sent at 08/22/2021  4:26 AM EDT ----- Notify - I reviewed her mammogram report (the follow up views) and f/u mammogram was ok.  Radiology recommended f/u screening in one year.  Let us know if any problems.

## 2021-08-24 ENCOUNTER — Other Ambulatory Visit: Payer: Self-pay

## 2021-08-24 ENCOUNTER — Ambulatory Visit (INDEPENDENT_AMBULATORY_CARE_PROVIDER_SITE_OTHER): Payer: Medicare HMO | Admitting: Internal Medicine

## 2021-08-24 VITALS — BP 130/82 | HR 82 | Temp 98.0°F | Resp 16 | Ht 68.0 in | Wt 174.2 lb

## 2021-08-24 DIAGNOSIS — E1159 Type 2 diabetes mellitus with other circulatory complications: Secondary | ICD-10-CM | POA: Diagnosis not present

## 2021-08-24 DIAGNOSIS — J439 Emphysema, unspecified: Secondary | ICD-10-CM | POA: Diagnosis not present

## 2021-08-24 DIAGNOSIS — Z8673 Personal history of transient ischemic attack (TIA), and cerebral infarction without residual deficits: Secondary | ICD-10-CM

## 2021-08-24 DIAGNOSIS — I700 Atherosclerosis of aorta: Secondary | ICD-10-CM | POA: Diagnosis not present

## 2021-08-24 DIAGNOSIS — I10 Essential (primary) hypertension: Secondary | ICD-10-CM

## 2021-08-24 DIAGNOSIS — H0510 Unspecified chronic inflammatory disorders of orbit: Secondary | ICD-10-CM

## 2021-08-24 DIAGNOSIS — E7800 Pure hypercholesterolemia, unspecified: Secondary | ICD-10-CM

## 2021-08-24 MED ORDER — PRAVASTATIN SODIUM 10 MG PO TABS: ORAL_TABLET | ORAL | 1 refills | Status: DC

## 2021-08-24 NOTE — Assessment & Plan Note (Signed)
Physical today 08/24/21.  PAP 08/19/19 - negative with negative HPV.  Mammogram

## 2021-08-24 NOTE — Progress Notes (Signed)
Patient ID: Janetta Egli, female   DOB: 06/08/1956, 65 y.o.   MRN: [id number]   Subjective:    Patient ID: [name], female    DOB: 08/14/1956, 65 y.o.   MRN: [id number]  This visit occurred during the SARS-CoV-2 public health emergency.  Safety protocols were in place, including screening questions prior to the visit, additional usage of staff PPE, and extensive cleaning of exam room while observing appropriate contact time as indicated for disinfecting solutions.   Patient here for her physical exam.   Chief Complaint  Patient presents with   Annual Exam   .   HPI Diagnosed with TIA recently.  Presented [place] - with RUE and RLE - numbness and weakness.  Also experienced some blurry vision a couple of days prior to presentation.  CTA neck - ok.  CT head - no acute intracranial process. Symptoms cleared. Diagnosed with TIA.  Placed on aspirin and plavix.  Discharged with plans to f/u with neurology.  They were to schedule appt.  No chest pain or sob reported.  No increased heart rate or palpitations.  No acid reflux reported.  No abdominal pain.  Bowels moving.      Past Medical History:  Diagnosis Date   GERD (gastroesophageal reflux disease)    neg H. pylori   Hyperlipidemia    Hypertension    Vitamin D deficiency    Past Surgical History:  Procedure Laterality Date   CHOLECYSTECTOMY  1994   rectal fissure repair  2002   TUBAL LIGATION  1995   Family History  Problem Relation Age of Onset   Hypertension Mother    Heart disease Mother    Hypercholesterolemia Brother    Colon cancer Neg Hx    Breast cancer Neg Hx    Social History   Socioeconomic History   Marital status: Married    Spouse name: Not on file   Number of children: 1   Years of education: Not on file   Highest education level: Not on file  Occupational History   Not on file  Tobacco Use   Smoking status: Former    Packs/day: 0.50    Years: 40.00    Pack years: 20.00     Types: Cigarettes    Quit date: 02/11/2020    Years since quitting: 1.5   Smokeless tobacco: Never  Vaping Use   Vaping Use: Never used  Substance and Sexual Activity   Alcohol use: No    Alcohol/week: 0.0 standard drinks   Drug use: No   Sexual activity: Not on file  Other Topics Concern   Not on file  Social History Narrative   Not on file   Social Determinants of Health   Financial Resource Strain: Low Risk    Difficulty of Paying Living Expenses: Not hard at all  Food Insecurity: No Food Insecurity   Worried About Programme researcher, broadcasting/film/video in the Last Year: Never true   Ran Out of Food in the Last Year: Never true  Transportation Needs: No Transportation Needs   Lack of Transportation (Medical): No   Lack of Transportation (Non-Medical): No  Physical Activity: Not on file  Stress: No Stress Concern Present   Feeling of Stress : Not at all  Social Connections: Unknown   Frequency of Communication with Friends and Family: More than three times a week   Frequency of Social Gatherings with Friends and Family: More than three times a week   Attends Religious Services: Not  on file   Active Member of Clubs or Organizations: Not on file   Attends Banker Meetings: Not on file   Marital Status: Not on file     Review of Systems  Constitutional:  Negative for appetite change and unexpected weight change.  HENT:  Negative for congestion, sinus pressure and sore throat.   Eyes:  Negative for pain and visual disturbance.  Respiratory:  Negative for cough, chest tightness and shortness of breath.   Cardiovascular:  Negative for chest pain, palpitations and leg swelling.  Gastrointestinal:  Negative for abdominal pain, diarrhea, nausea and vomiting.  Genitourinary:  Negative for difficulty urinating and dysuria.  Musculoskeletal:  Negative for joint swelling and myalgias.  Skin:  Negative for color change and rash.  Neurological:  Negative for dizziness, light-headedness  and headaches.       No further symptoms since discharge.   Hematological:  Negative for adenopathy. Does not bruise/bleed easily.  Psychiatric/Behavioral:  Negative for agitation and dysphoric mood.       Objective:     BP 130/82   Pulse 82   Temp 98 F (36.7 C)   Resp 16   Ht 5\' 8"  (1.727 m)   Wt 174 lb 3.2 oz (79 kg)   LMP 12/08/1997   SpO2 99%   BMI 26.49 kg/m  Wt Readings from Last 3 Encounters:  08/24/21 174 lb 3.2 oz (79 kg)  04/24/21 175 lb (79.4 kg)  02/20/21 172 lb (78 kg)    Physical Exam Vitals reviewed.  Constitutional:      General: She is not in acute distress.    Appearance: Normal appearance. She is well-developed.  HENT:     Head: Normocephalic and atraumatic.     Right Ear: External ear normal.     Left Ear: External ear normal.  Eyes:     General: No scleral icterus.       Right eye: No discharge.        Left eye: No discharge.     Conjunctiva/sclera: Conjunctivae normal.  Neck:     Thyroid: No thyromegaly.  Cardiovascular:     Rate and Rhythm: Normal rate and regular rhythm.  Pulmonary:     Effort: No tachypnea, accessory muscle usage or respiratory distress.     Breath sounds: Normal breath sounds. No decreased breath sounds or wheezing.  Chest:  Breasts:    Right: No inverted nipple, mass, nipple discharge or tenderness (no axillary adenopathy).     Left: No inverted nipple, mass, nipple discharge or tenderness (no axilarry adenopathy).  Abdominal:     General: Bowel sounds are normal.     Palpations: Abdomen is soft.     Tenderness: There is no abdominal tenderness.  Musculoskeletal:        General: No swelling or tenderness.     Cervical back: Neck supple.  Lymphadenopathy:     Cervical: No cervical adenopathy.  Skin:    Findings: No erythema or rash.  Neurological:     Mental Status: She is alert and oriented to person, place, and time.  Psychiatric:        Mood and Affect: Mood normal.        Behavior: Behavior normal.      Outpatient Encounter Medications as of 08/24/2021  Medication Sig   [EXPIRED] aspirin 81 MG chewable tablet Chew by mouth.   pravastatin (PRAVACHOL) 10 MG tablet Take one tablet q Monday and Thursday.   cholecalciferol (VITAMIN D) 1000 units tablet  Take 1,000 Units by mouth daily.   clopidogrel (PLAVIX) 75 MG tablet Take 75 mg by mouth daily.   No facility-administered encounter medications on file as of 08/24/2021.     Lab Results  Component Value Date   WBC 6.3 02/20/2021   HGB 14.3 02/20/2021   HCT 43.2 02/20/2021   PLT 191.0 02/20/2021   GLUCOSE 89 02/20/2021   CHOL 169 02/20/2021   TRIG 111.0 02/20/2021   HDL 41.70 02/20/2021   LDLDIRECT 110.0 06/18/2018   LDLCALC 105 (H) 02/20/2021   ALT 10 02/20/2021   AST 12 02/20/2021   NA 144 02/20/2021   K 4.3 02/20/2021   CL 107 02/20/2021   CREATININE 0.81 02/20/2021   BUN 9 02/20/2021   CO2 31 02/20/2021   TSH 2.03 02/20/2021   HGBA1C 5.9 08/01/2021   MICROALBUR 1.1 02/17/2020    MM DIAG BREAST TOMO UNI LEFT  Result Date: 08/18/2021 CLINICAL DATA:  Possible asymmetry central left breast on a recent screening mammogram. EXAM: DIGITAL DIAGNOSTIC UNILATERAL LEFT MAMMOGRAM WITH TOMOSYNTHESIS AND CAD TECHNIQUE: Left digital diagnostic mammography and breast tomosynthesis was performed. The images were evaluated with computer-aided detection. COMPARISON:  Previous exam(s). ACR Breast Density Category c: The breast tissue is heterogeneously dense, which may obscure small masses. FINDINGS: 3D tomographic and 2D generated spot compression images of the left breast demonstrate normal appearing fibroglandular tissue at the location of the recently suspected asymmetry, unchanged compared to previous examinations. IMPRESSION: No evidence of malignancy. The recently suspected left breast asymmetry was close apposition of normal breast tissue. RECOMMENDATION: Bilateral screening mammogram in 1 year. I have discussed the findings and  recommendations with the patient. If applicable, a reminder letter will be sent to the patient regarding the next appointment. BI-RADS CATEGORY  1: Negative. Electronically Signed   By: Beckie Salts M.D.   On: 08/18/2021 14:31      Assessment & Plan:   Problem List Items Addressed This Visit     Aortic atherosclerosis (HCC)    Intolerant to crestor.  Discussed trial of low dose pravastatin, especially given recent TIA.  Follow.       Relevant Medications   pravastatin (PRAVACHOL) 10 MG tablet   Controlled type 2 diabetes mellitus with circulatory disorder (HCC)    Sugars better off prednisone.  Check met b and a1c.  Low carb diet and exercise.        Relevant Medications   pravastatin (PRAVACHOL) 10 MG tablet   Emphysema lung (HCC)    Found on recent CT.  Has stopped smoking.  Breathing stable.  Follow.        History of TIA (transient ischemic attack)    Recent evaluation as outlined.  Continue aspirin and plavix.  No reoccurring symptoms.  Doing well.  Planned f/u with neurology.  Start pravastatin as outlined.  Continue risk factor modification.        Hypercholesterolemia - Primary    Discussed recommendation to start cholesterol medication.  Discussed TIA and aware of aortic atherosclerosis.  Continue low cholesterol diet and exercise. Agreed to start low dose pravastatin.        Relevant Medications   pravastatin (PRAVACHOL) 10 MG tablet   Other Relevant Orders   Hepatic function panel   Hypertension    Blood pressure doing well.  On no medication.  Follow pressures.  Follow metabolic panel.       Relevant Medications   pravastatin (PRAVACHOL) 10 MG tablet   Idiopathic orbital inflammatory syndrome  Off prednisone and MTX now.  Follow.         Dale [place], MD

## 2021-08-31 DIAGNOSIS — G459 Transient cerebral ischemic attack, unspecified: Secondary | ICD-10-CM | POA: Diagnosis not present

## 2021-08-31 DIAGNOSIS — H538 Other visual disturbances: Secondary | ICD-10-CM | POA: Diagnosis not present

## 2021-08-31 DIAGNOSIS — Z6826 Body mass index (BMI) 26.0-26.9, adult: Secondary | ICD-10-CM | POA: Diagnosis not present

## 2021-09-03 ENCOUNTER — Encounter: Payer: Self-pay | Admitting: Internal Medicine

## 2021-09-03 DIAGNOSIS — Z8673 Personal history of transient ischemic attack (TIA), and cerebral infarction without residual deficits: Secondary | ICD-10-CM | POA: Insufficient documentation

## 2021-09-03 NOTE — Assessment & Plan Note (Signed)
Discussed recommendation to start cholesterol medication.  Discussed TIA and aware of aortic atherosclerosis.  Continue low cholesterol diet and exercise. Agreed to start low dose pravastatin.

## 2021-09-03 NOTE — Assessment & Plan Note (Signed)
Blood pressure doing well.  On no medication.  Follow pressures.  Follow metabolic panel.

## 2021-09-03 NOTE — Assessment & Plan Note (Addendum)
Off prednisone and MTX now.  Follow.

## 2021-09-03 NOTE — Assessment & Plan Note (Signed)
Recent evaluation as outlined.  Continue aspirin and plavix.  No reoccurring symptoms.  Doing well.  Planned f/u with neurology.  Start pravastatin as outlined.  Continue risk factor modification.

## 2021-09-03 NOTE — Assessment & Plan Note (Signed)
Intolerant to crestor.  Discussed trial of low dose pravastatin, especially given recent TIA.  Follow.

## 2021-09-03 NOTE — Assessment & Plan Note (Signed)
Sugars better off prednisone.  Check met b and a1c.  Low carb diet and exercise.   

## 2021-09-03 NOTE — Assessment & Plan Note (Signed)
Found on recent CT.  Has stopped smoking.  Breathing stable.  Follow.

## 2021-09-15 DIAGNOSIS — Z6826 Body mass index (BMI) 26.0-26.9, adult: Secondary | ICD-10-CM | POA: Diagnosis not present

## 2021-09-15 DIAGNOSIS — Z7902 Long term (current) use of antithrombotics/antiplatelets: Secondary | ICD-10-CM | POA: Diagnosis not present

## 2021-09-15 DIAGNOSIS — R9431 Abnormal electrocardiogram [ECG] [EKG]: Secondary | ICD-10-CM | POA: Diagnosis not present

## 2021-09-15 DIAGNOSIS — I63512 Cerebral infarction due to unspecified occlusion or stenosis of left middle cerebral artery: Secondary | ICD-10-CM | POA: Diagnosis not present

## 2021-09-15 DIAGNOSIS — Z9181 History of falling: Secondary | ICD-10-CM | POA: Diagnosis not present

## 2021-09-15 DIAGNOSIS — I4510 Unspecified right bundle-branch block: Secondary | ICD-10-CM | POA: Diagnosis not present

## 2021-09-15 DIAGNOSIS — Z136 Encounter for screening for cardiovascular disorders: Secondary | ICD-10-CM | POA: Diagnosis not present

## 2021-09-15 DIAGNOSIS — E119 Type 2 diabetes mellitus without complications: Secondary | ICD-10-CM | POA: Diagnosis not present

## 2021-09-15 DIAGNOSIS — E7800 Pure hypercholesterolemia, unspecified: Secondary | ICD-10-CM | POA: Diagnosis not present

## 2021-09-15 DIAGNOSIS — R531 Weakness: Secondary | ICD-10-CM | POA: Diagnosis not present

## 2021-09-15 DIAGNOSIS — G459 Transient cerebral ischemic attack, unspecified: Secondary | ICD-10-CM | POA: Diagnosis not present

## 2021-09-15 DIAGNOSIS — Z20822 Contact with and (suspected) exposure to covid-19: Secondary | ICD-10-CM | POA: Diagnosis not present

## 2021-09-15 DIAGNOSIS — K219 Gastro-esophageal reflux disease without esophagitis: Secondary | ICD-10-CM | POA: Diagnosis not present

## 2021-09-15 DIAGNOSIS — Z79899 Other long term (current) drug therapy: Secondary | ICD-10-CM | POA: Diagnosis not present

## 2021-09-15 DIAGNOSIS — R200 Anesthesia of skin: Secondary | ICD-10-CM | POA: Diagnosis not present

## 2021-09-15 DIAGNOSIS — I720 Aneurysm of carotid artery: Secondary | ICD-10-CM

## 2021-09-15 DIAGNOSIS — I10 Essential (primary) hypertension: Secondary | ICD-10-CM | POA: Diagnosis not present

## 2021-09-15 HISTORY — DX: Aneurysm of carotid artery: I72.0

## 2021-09-16 DIAGNOSIS — R404 Transient alteration of awareness: Secondary | ICD-10-CM | POA: Diagnosis not present

## 2021-09-16 DIAGNOSIS — G459 Transient cerebral ischemic attack, unspecified: Secondary | ICD-10-CM | POA: Diagnosis not present

## 2021-09-16 DIAGNOSIS — I749 Embolism and thrombosis of unspecified artery: Secondary | ICD-10-CM | POA: Diagnosis not present

## 2021-09-16 DIAGNOSIS — I10 Essential (primary) hypertension: Secondary | ICD-10-CM | POA: Diagnosis not present

## 2021-09-16 DIAGNOSIS — I6523 Occlusion and stenosis of bilateral carotid arteries: Secondary | ICD-10-CM | POA: Diagnosis not present

## 2021-09-16 DIAGNOSIS — I63512 Cerebral infarction due to unspecified occlusion or stenosis of left middle cerebral artery: Secondary | ICD-10-CM | POA: Diagnosis not present

## 2021-09-16 DIAGNOSIS — I639 Cerebral infarction, unspecified: Secondary | ICD-10-CM | POA: Diagnosis not present

## 2021-09-16 DIAGNOSIS — I69351 Hemiplegia and hemiparesis following cerebral infarction affecting right dominant side: Secondary | ICD-10-CM | POA: Diagnosis not present

## 2021-09-16 DIAGNOSIS — I671 Cerebral aneurysm, nonruptured: Secondary | ICD-10-CM | POA: Diagnosis not present

## 2021-09-17 DIAGNOSIS — G459 Transient cerebral ischemic attack, unspecified: Secondary | ICD-10-CM | POA: Diagnosis not present

## 2021-09-18 ENCOUNTER — Telehealth: Payer: Self-pay

## 2021-09-18 NOTE — Telephone Encounter (Signed)
Transition Care Management Unsuccessful Follow-up Telephone Call  Date of discharge and from where:  09/18/21-[facility]  Attempts:  1st Attempt  Reason for unsuccessful TCM follow-up call:  No answer/busy. Will follow

## 2021-09-19 NOTE — Telephone Encounter (Addendum)
Transition Care Management Unsuccessful Follow-up Telephone Call  Date of discharge and from where:  09/18/21-[facility]  Attempts:  2nd Attempt  Reason for unsuccessful TCM follow-up call:  Left voice message. Will follow.

## 2021-09-20 NOTE — Telephone Encounter (Signed)
Transition Care Management Unsuccessful Follow-up Telephone Call  Date of discharge and from where:  09/18/21-[facility]  Attempts:  3rd Attempt  Reason for unsuccessful TCM follow-up call:  Left voice message

## 2021-09-21 NOTE — Telephone Encounter (Signed)
Pt was recently seen in [facility]. Pt was seen for TIA for the second time. Pt needs a follow up appt and would like to speak to Dr. Nicki Reaper regarding issues.

## 2021-09-21 NOTE — Telephone Encounter (Signed)
Patient scheduled for HFU. Confirmed nothing needed prior to appt.

## 2021-09-29 ENCOUNTER — Other Ambulatory Visit: Payer: Self-pay

## 2021-09-29 ENCOUNTER — Ambulatory Visit (INDEPENDENT_AMBULATORY_CARE_PROVIDER_SITE_OTHER): Payer: Medicare HMO | Admitting: Internal Medicine

## 2021-09-29 ENCOUNTER — Encounter: Payer: Self-pay | Admitting: Internal Medicine

## 2021-09-29 DIAGNOSIS — I700 Atherosclerosis of aorta: Secondary | ICD-10-CM

## 2021-09-29 DIAGNOSIS — E7800 Pure hypercholesterolemia, unspecified: Secondary | ICD-10-CM

## 2021-09-29 DIAGNOSIS — E1159 Type 2 diabetes mellitus with other circulatory complications: Secondary | ICD-10-CM | POA: Diagnosis not present

## 2021-09-29 DIAGNOSIS — I10 Essential (primary) hypertension: Secondary | ICD-10-CM

## 2021-09-29 DIAGNOSIS — J439 Emphysema, unspecified: Secondary | ICD-10-CM | POA: Diagnosis not present

## 2021-09-29 DIAGNOSIS — Z720 Tobacco use: Secondary | ICD-10-CM | POA: Diagnosis not present

## 2021-09-29 DIAGNOSIS — Z8673 Personal history of transient ischemic attack (TIA), and cerebral infarction without residual deficits: Secondary | ICD-10-CM

## 2021-09-29 NOTE — Progress Notes (Signed)
Patient ID: Carrie Wilson, female   DOB: 07-Dec-1955, 65 y.o.   MRN: [id number]   Subjective:    Patient ID: Carrie Wilson, female    DOB: [birth year], 65 y.o.   MRN: [id number]  This visit occurred during the SARS-CoV-2 public health emergency.  Safety protocols were in place, including screening questions prior to the visit, additional usage of staff PPE, and extensive cleaning of exam room while observing appropriate contact time as indicated for disinfecting solutions.   Patient here for hospital follow up.   Chief Complaint  Patient presents with   Hospitalization Follow-up    TIA   .   HPI Admitted 09/15/21 - 09/17/21 - recurrent TIA.  Prior to this admission, had presented to [facility] with RUE and RLE - numbness and weakness.  Also - blurry vision.  Was placed on aspirin and plavix.  Saw neurology in f/u.  Plavix was stopped.  Was doing well until day of admission.  States she had been working outside.  Came in - noticed right arm/right leg limp.  Lasted approximately 10 minutes.  CTA neck - no evidence of dissection/aneurysm. in the carotid or vertebral arteries.  No significant arterial stenosis.  CTA head - no intracranial hemorrhage, acute infarct or mass.  Evolution of left middle cerebral artery territory infarcts, 2mm saccular aneurysm along the C7 segment of the right ICA.  Multiple subacute infarcts in the left MCA territory mainly in the watershed areas of the frontal and parietal lobes concerning for embolic infarcts.  Scheduled for outpatient echo.  Since discharge - no symptoms.  No chest pain or sob. Eating.  Swallowing ok.  No abdominal pain.  Bowels moving.     Past Medical History:  Diagnosis Date   GERD (gastroesophageal reflux disease)    neg H. pylori   Hyperlipidemia    Hypertension    Vitamin D deficiency    Past Surgical History:  Procedure Laterality Date   CHOLECYSTECTOMY  1994   rectal fissure repair  2002   TUBAL LIGATION  1995    Family History  Problem Relation Age of Onset   Hypertension Mother    Heart disease Mother    Hypercholesterolemia Brother    Colon cancer Neg Hx    Breast cancer Neg Hx    Social History   Socioeconomic History   Marital status: Married    Spouse name: Not on file   Number of children: 1   Years of education: Not on file   Highest education level: Not on file  Occupational History   Not on file  Tobacco Use   Smoking status: Former    Packs/day: 0.50    Years: 40.00    Pack years: 20.00    Types: Cigarettes    Quit date: 02/11/2020    Years since quitting: 1.6   Smokeless tobacco: Never  Vaping Use   Vaping Use: Never used  Substance and Sexual Activity   Alcohol use: No    Alcohol/week: 0.0 standard drinks   Drug use: No   Sexual activity: Not on file  Other Topics Concern   Not on file  Social History Narrative   Not on file   Social Determinants of Health   Financial Resource Strain: Low Risk    Difficulty of Paying Living Expenses: Not hard at all  Food Insecurity: No Food Insecurity   Worried About Radiation protection practitioner of Food in the Last Year: Never true   Ran Out of Food in the  Last Year: Never true  Transportation Needs: No Transportation Needs   Lack of Transportation (Medical): No   Lack of Transportation (Non-Medical): No  Physical Activity: Not on file  Stress: No Stress Concern Present   Feeling of Stress : Not at all  Social Connections: Unknown   Frequency of Communication with Friends and Family: More than three times a week   Frequency of Social Gatherings with Friends and Family: More than three times a week   Attends Religious Services: Not on file   Active Member of Clubs or Organizations: Not on file   Attends Banker Meetings: Not on file   Marital Status: Not on file     Review of Systems  Constitutional:  Negative for appetite change and unexpected weight change.  HENT:  Negative for congestion and sinus pressure.    Respiratory:  Negative for cough, chest tightness and shortness of breath.   Cardiovascular:  Negative for chest pain, palpitations and leg swelling.  Gastrointestinal:  Negative for abdominal pain, diarrhea, nausea and vomiting.  Genitourinary:  Negative for difficulty urinating and dysuria.  Musculoskeletal:  Negative for joint swelling and myalgias.  Skin:  Negative for color change and rash.  Neurological:  Negative for dizziness, light-headedness and headaches.  Psychiatric/Behavioral:  Negative for agitation and dysphoric mood.       Objective:     BP 138/78   Pulse 75   Temp 97.6 F (36.4 C)   Resp 16   Ht 5\' 8"  (1.727 m)   Wt 173 lb 6.4 oz (78.7 kg)   LMP 12/08/1997   SpO2 98%   BMI 26.37 kg/m  Wt Readings from Last 3 Encounters:  09/29/21 173 lb 6.4 oz (78.7 kg)  08/24/21 174 lb 3.2 oz (79 kg)  04/24/21 175 lb (79.4 kg)    Physical Exam Vitals reviewed.  Constitutional:      General: She is not in acute distress.    Appearance: Normal appearance.  HENT:     Head: Normocephalic and atraumatic.     Right Ear: External ear normal.     Left Ear: External ear normal.  Eyes:     General: No scleral icterus.       Right eye: No discharge.        Left eye: No discharge.     Conjunctiva/sclera: Conjunctivae normal.  Neck:     Thyroid: No thyromegaly.  Cardiovascular:     Rate and Rhythm: Normal rate and regular rhythm.  Pulmonary:     Effort: No respiratory distress.     Breath sounds: Normal breath sounds. No wheezing.  Abdominal:     General: Bowel sounds are normal.     Palpations: Abdomen is soft.     Tenderness: There is no abdominal tenderness.  Musculoskeletal:        General: No swelling or tenderness.     Cervical back: Neck supple. No tenderness.  Lymphadenopathy:     Cervical: No cervical adenopathy.  Skin:    Findings: No erythema or rash.  Neurological:     Mental Status: She is alert.  Psychiatric:        Mood and Affect: Mood normal.         Behavior: Behavior normal.     Outpatient Encounter Medications as of 09/29/2021  Medication Sig   cholecalciferol (VITAMIN D) 1000 units tablet Take 1,000 Units by mouth daily.   clopidogrel (PLAVIX) 75 MG tablet Take 75 mg by mouth daily.   pravastatin (  PRAVACHOL) 10 MG tablet Take one tablet q Monday and Thursday.   No facility-administered encounter medications on file as of 09/29/2021.     Lab Results  Component Value Date   WBC 6.3 02/20/2021   HGB 14.3 02/20/2021   HCT 43.2 02/20/2021   PLT 191.0 02/20/2021   GLUCOSE 89 02/20/2021   CHOL 169 02/20/2021   TRIG 111.0 02/20/2021   HDL 41.70 02/20/2021   LDLDIRECT 110.0 06/18/2018   LDLCALC 105 (H) 02/20/2021   ALT 10 02/20/2021   AST 12 02/20/2021   NA 144 02/20/2021   K 4.3 02/20/2021   CL 107 02/20/2021   CREATININE 0.81 02/20/2021   BUN 9 02/20/2021   CO2 31 02/20/2021   TSH 2.03 02/20/2021   HGBA1C 5.9 08/01/2021   MICROALBUR 1.1 02/17/2020    MM DIAG BREAST TOMO UNI LEFT  Result Date: 08/18/2021 CLINICAL DATA:  Possible asymmetry central left breast on a recent screening mammogram. EXAM: DIGITAL DIAGNOSTIC UNILATERAL LEFT MAMMOGRAM WITH TOMOSYNTHESIS AND CAD TECHNIQUE: Left digital diagnostic mammography and breast tomosynthesis was performed. The images were evaluated with computer-aided detection. COMPARISON:  Previous exam(s). ACR Breast Density Category c: The breast tissue is heterogeneously dense, which may obscure small masses. FINDINGS: 3D tomographic and 2D generated spot compression images of the left breast demonstrate normal appearing fibroglandular tissue at the location of the recently suspected asymmetry, unchanged compared to previous examinations. IMPRESSION: No evidence of malignancy. The recently suspected left breast asymmetry was close apposition of normal breast tissue. RECOMMENDATION: Bilateral screening mammogram in 1 year. I have discussed the findings and recommendations with the  patient. If applicable, a reminder letter will be sent to the patient regarding the next appointment. BI-RADS CATEGORY  1: Negative. Electronically Signed   By: Beckie Salts M.D.   On: 08/18/2021 14:31      Assessment & Plan:   Problem List Items Addressed This Visit     Aortic atherosclerosis (HCC)    Discussed recommendation to be on cholesterol medication.  Discussed TIA and aware of aortic atherosclerosis.  Continue low cholesterol diet and exercise.  Tolerating pravastatin.  Taking daily now.  Follow       Controlled type 2 diabetes mellitus with circulatory disorder (HCC)    Sugars better off prednisone.  Check met b and a1c.  Low carb diet and exercise.        Emphysema lung (HCC)    Breathing stable.        History of TIA (transient ischemic attack)    Recently admitted with recurring TIA.  Scan revealed:  multiple subacute infarcts in the left MCA territory mainly in the watershed areas of the frontal and parietal lobes concerning for embolic infarcts. Scheduled for echo.  Discussed with her regarding f/u with cardiology - regarding further w/up/evaluation - question of need for loop recorder, etc.  She wants to hold on referral and see what echo reveals.  Continue aspirin and plavix.  Follow.       Hypercholesterolemia    Tolerating pravastatin.  Just increased to daily.  Follow lipid panel and liver function tests.        Hypertension    Blood pressure has been doing well on no medication.  Follow.        Tobacco use    Needs to quit smoking.  Follow         Dale Laclede, MD

## 2021-10-07 ENCOUNTER — Encounter: Payer: Self-pay | Admitting: Internal Medicine

## 2021-10-07 NOTE — Assessment & Plan Note (Signed)
Discussed recommendation to be on cholesterol medication.  Discussed TIA and aware of aortic atherosclerosis.  Continue low cholesterol diet and exercise.  Tolerating pravastatin.  Taking daily now.  Follow

## 2021-10-07 NOTE — Assessment & Plan Note (Signed)
Blood pressure has been doing well on no medication.  Follow.   

## 2021-10-07 NOTE — Assessment & Plan Note (Addendum)
Recently admitted with recurring TIA.  Scan revealed:  multiple subacute infarcts in the left MCA territory mainly in the watershed areas of the frontal and parietal lobes concerning for embolic infarcts. Scheduled for echo.  Discussed with her regarding f/u with cardiology - regarding further w/up/evaluation - question of need for loop recorder, etc.  She wants to hold on referral and see what echo reveals.  Continue aspirin and plavix.  Follow.

## 2021-10-07 NOTE — Assessment & Plan Note (Signed)
Tolerating pravastatin.  Just increased to daily.  Follow lipid panel and liver function tests.

## 2021-10-07 NOTE — Assessment & Plan Note (Signed)
Needs to quit smoking.  Follow  

## 2021-10-07 NOTE — Assessment & Plan Note (Signed)
Breathing stable.

## 2021-10-07 NOTE — Assessment & Plan Note (Signed)
Sugars better off prednisone.  Check met b and a1c.  Low carb diet and exercise.

## 2021-10-13 DIAGNOSIS — I749 Embolism and thrombosis of unspecified artery: Secondary | ICD-10-CM | POA: Diagnosis not present

## 2021-10-13 DIAGNOSIS — G459 Transient cerebral ischemic attack, unspecified: Secondary | ICD-10-CM | POA: Diagnosis not present

## 2021-10-13 HISTORY — PX: TRANSTHORACIC ECHOCARDIOGRAM: SHX275

## 2021-10-17 DIAGNOSIS — H5213 Myopia, bilateral: Secondary | ICD-10-CM | POA: Diagnosis not present

## 2021-10-17 DIAGNOSIS — Z0100 Encounter for examination of eyes and vision without abnormal findings: Secondary | ICD-10-CM | POA: Diagnosis not present

## 2021-10-17 LAB — HM DIABETES EYE EXAM

## 2021-10-19 ENCOUNTER — Other Ambulatory Visit (INDEPENDENT_AMBULATORY_CARE_PROVIDER_SITE_OTHER): Payer: Medicare HMO

## 2021-10-19 ENCOUNTER — Telehealth: Payer: Self-pay | Admitting: Internal Medicine

## 2021-10-19 ENCOUNTER — Other Ambulatory Visit: Payer: Self-pay

## 2021-10-19 DIAGNOSIS — E1159 Type 2 diabetes mellitus with other circulatory complications: Secondary | ICD-10-CM | POA: Diagnosis not present

## 2021-10-19 DIAGNOSIS — I10 Essential (primary) hypertension: Secondary | ICD-10-CM

## 2021-10-19 DIAGNOSIS — E7800 Pure hypercholesterolemia, unspecified: Secondary | ICD-10-CM

## 2021-10-19 DIAGNOSIS — Z8673 Personal history of transient ischemic attack (TIA), and cerebral infarction without residual deficits: Secondary | ICD-10-CM

## 2021-10-19 DIAGNOSIS — Z1159 Encounter for screening for other viral diseases: Secondary | ICD-10-CM

## 2021-10-19 LAB — MICROALBUMIN / CREATININE URINE RATIO
Creatinine,U: 269.1 mg/dL
Microalb Creat Ratio: 0.6 mg/g (ref 0.0–30.0)
Microalb, Ur: 1.6 mg/dL (ref 0.0–1.9)

## 2021-10-19 LAB — LIPID PANEL
Cholesterol: 154 mg/dL (ref 0–200)
HDL: 42.4 mg/dL (ref >39.00)
LDL Cholesterol: 86 mg/dL (ref 0–99)
NonHDL: 111.72
Total CHOL/HDL Ratio: 4
Triglycerides: 127 mg/dL (ref 0.0–149.0)
VLDL: 25.4 mg/dL (ref 0.0–40.0)

## 2021-10-19 LAB — HEPATIC FUNCTION PANEL
ALT: 10 U/L (ref 0–35)
AST: 13 U/L (ref 0–37)
Albumin: 3.9 g/dL (ref 3.5–5.2)
Alkaline Phosphatase: 74 U/L (ref 39–117)
Bilirubin, Direct: 0.1 mg/dL (ref 0.0–0.3)
Total Bilirubin: 0.6 mg/dL (ref 0.2–1.2)
Total Protein: 6.3 g/dL (ref 6.0–8.3)

## 2021-10-19 LAB — BASIC METABOLIC PANEL
BUN: 11 mg/dL (ref 6–23)
CO2: 30 mEq/L (ref 19–32)
Calcium: 9.1 mg/dL (ref 8.4–10.5)
Chloride: 106 mEq/L (ref 96–112)
Creatinine, Ser: 0.92 mg/dL (ref 0.40–1.20)
GFR: 64.67 mL/min (ref >60.00)
Glucose, Bld: 91 mg/dL (ref 70–99)
Potassium: 4.1 mEq/L (ref 3.5–5.1)
Sodium: 142 mEq/L (ref 135–145)

## 2021-10-19 LAB — HEMOGLOBIN A1C: Hgb A1c MFr Bld: 6.4 % (ref 4.6–6.5)

## 2021-10-19 NOTE — Telephone Encounter (Signed)
FYI- Pt wanted to let PCP know her echocardiogram has been completed.

## 2021-10-19 NOTE — Telephone Encounter (Signed)
Please notify her that I would like to refer her to cardiology - to further evaluate possible etiology for her strokes.  I had discussed this with her at her last appt and she wanted to wait until after her echo.  If agreeable, would like to refer to [facility] cardiology in [place].

## 2021-10-19 NOTE — Telephone Encounter (Signed)
Just FYI.

## 2021-10-20 LAB — HEPATITIS C ANTIBODY
Hepatitis C Ab: NONREACTIVE
SIGNAL TO CUT-OFF: 0.04 (ref <1.00)

## 2021-10-20 NOTE — Telephone Encounter (Signed)
Order placed for cardiology referral.   

## 2021-10-20 NOTE — Telephone Encounter (Signed)
Patient agreed to referral does PCP have a preference for provider at [facility] cardiology

## 2021-11-02 ENCOUNTER — Other Ambulatory Visit: Payer: Self-pay

## 2021-11-02 ENCOUNTER — Telehealth: Payer: Self-pay | Admitting: Internal Medicine

## 2021-11-02 MED ORDER — PRAVASTATIN SODIUM 10 MG PO TABS: 10.0000 mg | ORAL_TABLET | Freq: Every day | ORAL | 3 refills | Status: DC

## 2021-11-29 ENCOUNTER — Telehealth: Payer: Self-pay | Admitting: Internal Medicine

## 2021-11-29 MED ORDER — PRAVASTATIN SODIUM 10 MG PO TABS: 10.0000 mg | ORAL_TABLET | Freq: Every day | ORAL | 3 refills | Status: DC

## 2021-11-29 NOTE — Telephone Encounter (Signed)
Pt called in stating that she need a refill on her cholesterol medication. Pt stated that she doesn't know the name of the medication. Pt stated that Larena Glassman knows the name of the medication. Pt requesting callback

## 2021-11-29 NOTE — Telephone Encounter (Signed)
Patient returned office phone call. Message read to patient.

## 2021-11-29 NOTE — Telephone Encounter (Signed)
LMTCB. Okay to let pt know that her cholesterol medication has been refilled for 1 year.

## 2021-11-30 ENCOUNTER — Encounter: Payer: Self-pay | Admitting: Cardiology

## 2021-11-30 ENCOUNTER — Other Ambulatory Visit: Payer: Self-pay

## 2021-11-30 ENCOUNTER — Ambulatory Visit: Payer: Medicare HMO | Admitting: Cardiology

## 2021-11-30 VITALS — BP 122/86 | HR 82 | Ht 68.0 in | Wt 175.0 lb

## 2021-11-30 DIAGNOSIS — E785 Hyperlipidemia, unspecified: Secondary | ICD-10-CM | POA: Diagnosis not present

## 2021-11-30 DIAGNOSIS — I671 Cerebral aneurysm, nonruptured: Secondary | ICD-10-CM | POA: Diagnosis not present

## 2021-11-30 DIAGNOSIS — E1169 Type 2 diabetes mellitus with other specified complication: Secondary | ICD-10-CM

## 2021-11-30 DIAGNOSIS — I10 Essential (primary) hypertension: Secondary | ICD-10-CM

## 2021-11-30 DIAGNOSIS — I700 Atherosclerosis of aorta: Secondary | ICD-10-CM | POA: Diagnosis not present

## 2021-11-30 DIAGNOSIS — Z8673 Personal history of transient ischemic attack (TIA), and cerebral infarction without residual deficits: Secondary | ICD-10-CM

## 2021-11-30 DIAGNOSIS — Z720 Tobacco use: Secondary | ICD-10-CM

## 2021-11-30 NOTE — Assessment & Plan Note (Signed)
Blood pressure actually is pretty good.  She is not on medications.  Monitor closely.

## 2021-11-30 NOTE — Assessment & Plan Note (Signed)
Surreptitious finding during her evaluation for TIA.  Aggressive risk factor modification with glycemic, lipid and blood pressure control.  Medications have been initiated.  Not currently requiring blood pressure management, reevaluating glycemic control per low threshold to consider initiating a minimum metformin plus or minus SGLT2 inhibitor/GLP-1 agonist.

## 2021-11-30 NOTE — Progress Notes (Signed)
Primary Care Provider: Einar Pheasant, MD [facility] Cardiologist: None Electrophysiologist: None  Clinic Note: Chief Complaint  Patient presents with   New Patient (Initial Visit)    Referred by PCP for History of embolic stroke. Meds reviewed verbally with patient.     ===================================  ASSESSMENT/PLAN   Problem List Items Addressed This Visit       Cardiology Problems   Hyperlipidemia associated with type 2 diabetes mellitus (HCC) (Chronic)    Monitored by Dr. Nicki Reaper.  Pravastatin recently increased to daily dosing. Most recent LDL 86 with cholesterol of 154.  With diagnosis of stroke/TIA at aortic atherosclerosis, reasonable to be aggressive treating, LDL at least below 100 if not less than 70.  A1c as of December 2020 was at 6.4 which is borderline diagnosis for diabetes.  Not currently on a medication, defer to PCP.      Relevant Medications   aspirin 81 MG chewable tablet   Essential hypertension (Chronic)    Blood pressure actually is pretty good.  She is not on medications.  Monitor closely.      Relevant Medications   aspirin 81 MG chewable tablet   Cerebral aneurysm without rupture (Chronic)    Aggressive discharge medication with lipid management and blood pressure control. She is on aspirin and Plavix there is no suggestion on the CT scan that the aneurysm appeared enlarged. Will likely need surveillance evaluation.      Relevant Medications   aspirin 81 MG chewable tablet   Other Relevant Orders   Ambulatory referral to Cardiac Electrophysiology   Aortic atherosclerosis (HCC) (Chronic)    Surreptitious finding during her evaluation for TIA.  Aggressive risk factor modification with glycemic, lipid and blood pressure control.  Medications have been initiated.  Not currently requiring blood pressure management, reevaluating glycemic control per low threshold to consider initiating a minimum metformin plus or minus SGLT2  inhibitor/GLP-1 agonist.      Relevant Medications   aspirin 81 MG chewable tablet     Other   Tobacco use (Chronic)    Counseled on smoking cessation.      History of TIA (transient ischemic attack) - Primary (Chronic)    Recurrent TIA with 2 episodes less than 2 months apart.  Concerning features that the MRI of the brain shows multiple small watershed distribution likely embolic infarcts.  This is definitely concerning for A. fib as an etiology.  No other source has been found.  Carotids seem to be okay with exception of the mild saccular aneurysm on the right which would not explain left-sided infarcts.  Echocardiogram normal, but a standard echocardiogram would not be helpful to evaluate source of stroke.  That would require a bubble study.  However the more likely cause a cardioembolic phenomena is A. fib.  I do agree that a loop recorder is clearly warranted.  She wants to take [employer].  Abdomen and refer her to Dr. Quentin Ore to discuss loop recorder.  If the decision is to proceed with the recorder then she would not need an event monitor, however if she decides against event monitor, I think that at least 2 back-to-back 14-day Zio patch monitor devices are warranted.  Just leery of the fact that we may not capture an event since her to TIAs or more than a month apart.  For now continue aspirin and Plavix.  Also agree with statin for tight lipid control. Close blood pressure and glycemic control as well.      Relevant  Orders   Ambulatory referral to Cardiac Electrophysiology    ===================================  HPI:    Carrie Wilson is a 66 y.o. female with a PMH notable for DM-2, HTN and recent TIA as well as Cerebral Aneurysm who is being seen today for the evaluation of RECENT TIA X2 at the request of Einar Pheasant, MD.  Carrie Wilson was seen by Dr. Nicki Reaper on September 29, 2021 following her second TIA admission to the hospital.  Apparently after  her first episode she had been placed on aspirin Plavix.  She was then seen by neurology and Plavix was stopped-citing increased risk of bleeding versus protection.  Carrie Wilson had been doing well but then shortly after stopping Plavix, she had another event leading to her second hospitalization November 4.  This time she was actually admitted.  Interestingly, she was seen by neurology after the second event and another echocardiogram was ordered (despite having already had an echocardiogram 2 months earlier.  The Discharge Summary from the second hospitalization suggested considering event monitor, by Dr. Nicki Reaper justifiably question the need for loop recorder.  The patient indicated that she will follow-up with see what the echocardiogram showed and then potentially could be referred to cardiology.  Dr. Nicki Reaper justifiably also placed her back on Plavix and told her to continue aspirin and Plavix.  She then also started a statin.  => She now presents for Cardiology evaluation ostensibly for monitor versus loop recorder.  Unfortunately she is scheduled to for "General Cardiology" (seen by an Interventional Cardiologist) opposed to Electrophysiology.  Recent Hospitalizations:  08/01/2021: [facility] ED -> TIA.  2 episodes of numbness with the right upper & lower extremity-initial episode lasting 2 to 4 minutes, the second lasted 4 to 5 minutes.  No deficits on arrival to ER. => CTA neck reassuring.  Patient was on aspirin plus Plavix -------- 09/15/2021 [facility] ER): Recurrent TIA--> 3 recurrent episodes of transient right-sided weakness.; MRI indicating multiple subacute left MCA territory infarcts suggestive of watershed embolic events. Outpatient echo ordered Started on statin and continued on aspirin Plavix considered outpatient monitor  Reviewed  CV studies:    The following studies were reviewed today: (if available, images/films reviewed: From Epic Chart or Care Everywhere)  CTA Neck-brain  09/17/2021 [facility]): No evidence of dissection/aneurysm of the carotid or vertebral arteries.  No significant arterial stenosis..->  No ICH, acute infarct or mass.  Secular right maxillary sinus surgery.  Major arterial structures appear normal.  No definite stenoses.  2 mm saccular outpouching along C7 segment of right ICA. MRI brain 09/15/2021: Multiple subacute infarcts of the left MCA territory mainly watershed areas of the frontal and parietal lobes concerning for embolic infarcts. TTE 10/13/2021: [place]: Normal LV size and function.  EF 60 to 65%.  Normal diastolic pressure.  Normal RV.  Normal valves  Interval History:   Carrie Wilson presents today stating that she is feels fine now has not had any further episodes since she started back on Plavix.  She says that each time she had one of her spells they lasted maybe 5 to 10 minutes before her daughter caving into her house symptoms resolved.  She is little bit upset that the second episode happened after having stopped the Plavix.  She is happy to be back on it.  She is not having any issues with the statin.  She does not have any bleeding issues on aspirin and Plavix.  As she has never noticed any  symptoms at all to suggest that she has had an arrhythmia.  No PND, orthopnea or edema.  No chest pain or pressure with rest or exertion.  No exertional dyspnea.  CV Review of Symptoms (Summary) Cardiovascular ROS: no chest pain or dyspnea on exertion positive for - -only notable for 2 TIA episodes with MRI suggesting prior infarct. negative for - edema, irregular heartbeat, orthopnea, palpitations, paroxysmal nocturnal dyspnea, rapid heart rate, shortness of breath, or lightheadedness or dizziness, syncope/near syncope.  No further TIA or amaurosis fugax symptoms.  No claudication.  REVIEWED OF SYSTEMS   Review of Systems  Constitutional:  Negative for malaise/fatigue and weight loss.  HENT:  Negative for nosebleeds.    Respiratory:  Negative for cough and shortness of breath.   Cardiovascular:        Per HPI  Gastrointestinal:  Negative for abdominal pain, blood in stool and melena.  Genitourinary:  Negative for hematuria.  Musculoskeletal: Negative.   Neurological:  Negative for dizziness, tingling, sensory change, speech change, focal weakness, weakness and headaches.       No residual effect from her TIA spells.  No residual weakness or numbness in the right side.  Psychiatric/Behavioral: Negative.     I have reviewed and (if needed) personally updated the patient's problem list, medications, allergies, past medical and surgical history, social and family history.   PAST MEDICAL HISTORY   Past Medical History:  Diagnosis Date   Carotid aneurysm, right ([place]) 09/15/2021    CTA Neck-brain 09/17/2021 [facility]): No evidence of dissection/aneurysm of the carotid or vertebral arteries.  No significant arterial stenosis..->  No ICH, acute infarct or mass.  Secular right maxillary sinus surgery.  Major arterial structures appear normal.  No definite stenoses.  2 mm saccular outpouching along C7 segment of right ICA.   GERD (gastroesophageal reflux disease)    neg H. pylori   Hx of transient ischemic attack (TIA) 08/01/2021   Recurrent episode September 15, 2021: Normal echo.  CTA neck shows 2 mm saccular aneurysm of right ICA.  MRI brain shows multiple subacute infarcts of left MCA territory mainly watershed areas of frontal and parietal lobes concerning for embolic infarcts.   Hyperlipidemia    Hypertension    Vitamin D deficiency     PAST SURGICAL HISTORY   Past Surgical History:  Procedure Laterality Date   CHOLECYSTECTOMY  11/12/1992   rectal fissure repair  11/12/2000   Right Frontal Sinus Surgery Right 02/03/2018   OCULOPLASTIC SURGERY: Procedures or frontal orbital mass removal: Stereotactic computer-assisted navigational procedure-cranial/extradural; right eye socket exploration -orbitotomy with  bone flap and lesion removal, right frontal sinus/transorbital exploration (frontal sinusotomy) [facility] Main OR: Dr. [name] (Opthalmology), [name] (ENT)   TRANSTHORACIC ECHOCARDIOGRAM  10/13/2021   Normal LV size and function.  EF 60 to 65%.  Normal diastolic pressure.  Normal RV.  Normal valves   TUBAL LIGATION  11/12/1993   OCULOPLASTIC SURGERY Right 01/06/2018  Anterior orbitotomy with removal of lesion Right Transorbital approach to lamina papyracea sinusotomy right side on 01/06/2018 with Dr [name]. fro orbital mass lesion on the   [place] Right 01/06/2018  Procedure: SINUSOTOMY FRONTAL; Guerry Bruin; Surgeon: Elisabeth Cara, MD; Location: MAIN OR [facility]; Service: ENT   PR EXPLOR FRONT SINUS,TRANSORBIT,UNI Right 01/06/2018  Procedure: Sinusotomy Frontal; Transorbital Unilat; Surgeon: Jeni Salles, MD; Location: MAIN OR [facility]; Service: Ophthalmology   PR EXPLORE EYE SOCKET,REMV LESN Right 01/06/2018  Procedure: ORBITOTOMY WO BONE FLAP; W/REMOV LES; Surgeon:  Elisabeth Cara, MD; Location: MAIN OR [facility]; Service: ENT   PR EXPLORE EYE SOCKET,REMV LESN Right 01/06/2018  Procedure: Orbitotomy Wo Bone Flap; Linward Foster; Surgeon: Jeni Salles, MD; Location: MAIN OR [facility]; Service: Ophthalmology   PR STEREOTACTIC COMP ASSIST PROC,CRANIAL,EXTRADURAL N/A 01/06/2018  Procedure: STEREOTACTIC COMPUTER-ASSISTED (NAVIGATIONAL) PROCEDURE; CRANIAL, EXTRADURAL; Surgeon: Elisabeth Cara, MD; Location: MAIN OR [facility]; Service: ENT   There is no immunization history on file for this patient.  MEDICATIONS/ALLERGIES   Current Meds  Medication Sig   aspirin 81 MG chewable tablet Chew 81 mg by mouth daily.   cholecalciferol (VITAMIN D) 1000 units tablet Take 1,000 Units by mouth daily.   clopidogrel (PLAVIX) 75 MG tablet Take 75 mg by mouth daily.   pravastatin (PRAVACHOL) 10 MG tablet Take 1 tablet (10 mg total) by mouth daily.    Allergies  Allergen  Reactions   Codeine Sulfate Nausea And Vomiting   Penicillins Hives    SOCIAL HISTORY/FAMILY HISTORY   Reviewed in Epic:   Social History   Tobacco Use   Smoking status: Former    Packs/day: 0.50    Years: 40.00    Pack years: 20.00    Types: Cigarettes    Quit date: 02/11/2020    Years since quitting: 1.8   Smokeless tobacco: Never  Vaping Use   Vaping Use: Never used  Substance Use Topics   Alcohol use: No    Alcohol/week: 0.0 standard drinks   Drug use: No   Social History   Social History Narrative   She is very active, always on the go.  Not necessarily routine exercise, but always doing things.  Not limited by any myalgias or arthralgias or dyspnea.   Family History  Problem Relation Age of Onset   Hypertension Mother    Heart disease Mother    Hypercholesterolemia Brother    Colon cancer Neg Hx    Breast cancer Neg Hx     OBJCTIVE -PE, EKG, labs   Wt Readings from Last 3 Encounters:  11/30/21 175 lb (79.4 kg)  09/29/21 173 lb 6.4 oz (78.7 kg)  08/24/21 174 lb 3.2 oz (79 kg)    Physical Exam: BP 122/86 (BP Location: Left Arm, Patient Position: Sitting, Cuff Size: Normal)    Pulse 82    Ht 5\' 8"  (1.727 m)    Wt 175 lb (79.4 kg)    LMP 12/08/1997    SpO2 98%    BMI 26.61 kg/m  Physical Exam Vitals reviewed.  Constitutional:      General: She is not in acute distress.    Appearance: Normal appearance. She is normal weight. She is not ill-appearing or toxic-appearing.  HENT:     Head: Normocephalic and atraumatic.  Neck:     Vascular: No carotid bruit or JVD.  Cardiovascular:     Rate and Rhythm: Normal rate and regular rhythm. No extrasystoles are present.    Chest Wall: PMI is not displaced.     Pulses: Normal pulses.     Heart sounds: S1 normal and S2 normal. Heart sounds are distant. No murmur heard.   No friction rub. No gallop.  Pulmonary:     Effort: Pulmonary effort is normal. No respiratory distress.     Breath sounds: Normal breath  sounds. No wheezing, rhonchi or rales.  Chest:     Chest wall: No tenderness.  Abdominal:     General: Abdomen is flat. Bowel sounds are normal. There is no distension.  Palpations: Abdomen is soft.     Tenderness: There is no abdominal tenderness.  Musculoskeletal:        General: No swelling. Normal range of motion.     Cervical back: Normal range of motion and neck supple.  Skin:    General: Skin is warm and dry.  Neurological:     General: No focal deficit present.     Mental Status: She is alert and oriented to person, place, and time.     Gait: Gait normal.  Psychiatric:        Mood and Affect: Mood normal.        Behavior: Behavior normal.        Thought Content: Thought content normal.        Judgment: Judgment normal.     Adult ECG Report Reported EKG from [facility] ER visit 09/15/2021: NSR, RBBB.  Nonspecific ST and T wave abnormalities with T wave version less evident in anterior leads compared to previous. \ Recent Labs: Reviewed Lab Results  Component Value Date   CHOL 154 10/19/2021   HDL 42.40 10/19/2021   LDLCALC 86 10/19/2021   TRIG 127.0 10/19/2021   CHOLHDL 4 10/19/2021   From [facility] 08/01/2021: TC 184, TG 181, HDL 40, LDL 108.  TSH 1.704; A1c 5.9.  Lab Results  Component Value Date   CREATININE 0.92 10/19/2021   BUN 11 10/19/2021   NA 142 10/19/2021   K 4.1 10/19/2021   CL 106 10/19/2021   CO2 30 10/19/2021   CBC Latest Ref Rng & Units 02/20/2021 02/17/2020 10/31/2018  WBC 4.0 - 10.5 K/uL 6.3 7.0 7.6  Hemoglobin 12.0 - 15.0 g/dL 14.3 14.4 15.0  Hematocrit 36.0 - 46.0 % 43.2 42.7 44.7  Platelets 150.0 - 400.0 K/uL 191.0 209.0 198.0    Lab Results  Component Value Date   HGBA1C 6.4 10/19/2021   Lab Results  Component Value Date   TSH 2.03 02/20/2021    ==================================================  COVID-19 Education: The signs and symptoms of COVID-19 were discussed with the patient and how to seek care for testing (follow up with PCP or  arrange E-visit).    I spent a total of 29 minutes with the patient spent in direct patient consultation.  Additional time spent with chart review  / charting (studies, outside notes, etc): 45 min -> Extensive chart review with 2 hospitalizations, consult notes, 2 echocardiograms, MRIs etc. all to understand the patient's presentation.  Also clinic note reviewed. Total Time: 74 min  Current medicines are reviewed at length with the patient today.  (+/- concerns) she asked whether or not she should be on aspirin or Plavix, recommendation is indeed yes she should stay on it.  This visit occurred during the SARS-CoV-2 public health emergency.  Safety protocols were in place, including screening questions prior to the visit, additional usage of staff PPE, and extensive cleaning of exam room while observing appropriate contact time as indicated for disinfecting solutions.  Notice: This dictation was prepared with Dragon dictation along with smart phrase technology. Any transcriptional errors that result from this process are unintentional and may not be corrected upon review.   Studies Ordered:  Orders Placed This Encounter  Procedures   Ambulatory referral to Cardiac Electrophysiology    Patient Instructions / Medication Changes & Studies & Tests Ordered   Patient Instructions  Medication Instructions:  Your physician recommends that you continue on your current medications as directed. Please refer to the Current Medication list given to you today.   *  If you need a refill on your cardiac medications before your next appointment, please call your pharmacy*   Lab Work: None ordered  If you have labs (blood work) drawn today and your tests are completely normal, you will receive your results only by: [place] (if you have MyChart) OR A paper copy in the mail If you have any lab test that is abnormal or we need to change your treatment, we will call you to review the  results.   Testing/Procedures: None ordered   Follow-Up: At [facility], you and your health needs are our priority.  As part of our continuing mission to provide you with exceptional heart care, we have created designated Provider Care Teams.  These Care Teams include your primary Cardiologist (physician) and Advanced Practice Providers (APPs -  Physician Assistants and Nurse Practitioners) who all work together to provide you with the care you need, when you need it.  We recommend signing up for the patient portal called "MyChart".  Sign up information is provided on this After Visit Summary.  MyChart is used to connect with patients for Virtual Visits (Telemedicine).  Patients are able to view lab/test results, encounter notes, upcoming appointments, etc.  Non-urgent messages can be sent to your provider as well.   To learn more about what you can do with MyChart, go to [email or web address].    Your next appointment:   Follow up after EP appointment  The format for your next appointment:   In Person  Provider:   You may see Glenetta Hew, MD or one of the following Advanced Practice Providers on your designated Care Team:   Murray Hodgkins, NP Christell Faith, PA-C Cadence Kathlen Mody, PA-C :1}    Other Instructions Referral placed for Dr. Quentin Ore.     Glenetta Hew, M.D., M.S. Interventional Cardiologist   Pager # [phone] Phone # [phone] [address] [place], [place] [address]   Thank you for choosing Heartcare in [place]!!

## 2021-11-30 NOTE — Assessment & Plan Note (Signed)
Counseled on smoking cessation  

## 2021-11-30 NOTE — Assessment & Plan Note (Signed)
Aggressive discharge medication with lipid management and blood pressure control. She is on aspirin and Plavix there is no suggestion on the CT scan that the aneurysm appeared enlarged. Will likely need surveillance evaluation.

## 2021-11-30 NOTE — Assessment & Plan Note (Signed)
Recurrent TIA with 2 episodes less than 2 months apart.  Concerning features that the MRI of the brain shows multiple small watershed distribution likely embolic infarcts.  This is definitely concerning for A. fib as an etiology.  No other source has been found.  Carotids seem to be okay with exception of the mild saccular aneurysm on the right which would not explain left-sided infarcts.  Echocardiogram normal, but a standard echocardiogram would not be helpful to evaluate source of stroke.  That would require a bubble study.  However the more likely cause a cardioembolic phenomena is A. fib.  I do agree that a loop recorder is clearly warranted.  She wants to take [employer].  Abdomen and refer her to Dr. Quentin Ore to discuss loop recorder.  If the decision is to proceed with the recorder then she would not need an event monitor, however if she decides against event monitor, I think that at least 2 back-to-back 14-day Zio patch monitor devices are warranted.  Just leery of the fact that we may not capture an event since her to TIAs or more than a month apart.  For now continue aspirin and Plavix.  Also agree with statin for tight lipid control. Close blood pressure and glycemic control as well.

## 2021-11-30 NOTE — Assessment & Plan Note (Signed)
Monitored by Dr. Nicki Reaper.  Pravastatin recently increased to daily dosing. Most recent LDL 86 with cholesterol of 154.  With diagnosis of stroke/TIA at aortic atherosclerosis, reasonable to be aggressive treating, LDL at least below 100 if not less than 70.  A1c as of December 2020 was at 6.4 which is borderline diagnosis for diabetes.  Not currently on a medication, defer to PCP.

## 2021-11-30 NOTE — Patient Instructions (Signed)
Medication Instructions:  Your physician recommends that you continue on your current medications as directed. Please refer to the Current Medication list given to you today.   *If you need a refill on your cardiac medications before your next appointment, please call your pharmacy*   Lab Work: None ordered  If you have labs (blood work) drawn today and your tests are completely normal, you will receive your results only by: [place] (if you have MyChart) OR A paper copy in the mail If you have any lab test that is abnormal or we need to change your treatment, we will call you to review the results.   Testing/Procedures: None ordered   Follow-Up: At [facility], you and your health needs are our priority.  As part of our continuing mission to provide you with exceptional heart care, we have created designated Provider Care Teams.  These Care Teams include your primary Cardiologist (physician) and Advanced Practice Providers (APPs -  Physician Assistants and Nurse Practitioners) who all work together to provide you with the care you need, when you need it.  We recommend signing up for the patient portal called "MyChart".  Sign up information is provided on this After Visit Summary.  MyChart is used to connect with patients for Virtual Visits (Telemedicine).  Patients are able to view lab/test results, encounter notes, upcoming appointments, etc.  Non-urgent messages can be sent to your provider as well.   To learn more about what you can do with MyChart, go to [email or web address].    Your next appointment:   Follow up after EP appointment  The format for your next appointment:   In Person  Provider:   You may see Glenetta Hew, MD or one of the following Advanced Practice Providers on your designated Care Team:   Murray Hodgkins, NP Christell Faith, PA-C Cadence Kathlen Mody, PA-C :1}    Other Instructions Referral placed for Dr. Quentin Ore.

## 2021-12-08 ENCOUNTER — Ambulatory Visit (INDEPENDENT_AMBULATORY_CARE_PROVIDER_SITE_OTHER): Payer: Medicare HMO

## 2021-12-08 VITALS — Ht 68.0 in | Wt 175.0 lb

## 2021-12-08 DIAGNOSIS — Z0000 Encounter for general adult medical examination without abnormal findings: Secondary | ICD-10-CM

## 2021-12-08 NOTE — Progress Notes (Addendum)
Subjective:   Carrie Wilson is a 66 y.o. female who presents for Medicare Annual (Subsequent) preventive examination.  Review of Systems    No ROS.  Medicare Wellness Virtual Visit.  Visual/audio telehealth visit, UTA vital signs.   See social history for additional risk factors.   Cardiac Risk Factors include: advanced age (>31men, >17 women)     Objective:    Today's Vitals   12/08/21 1036  Weight: 175 lb (79.4 kg)  Height: 5\' 8"  (1.727 m)   Body mass index is 26.61 kg/m.  Advanced Directives 12/08/2021 12/07/2020 09/08/2017 06/03/2017 10/21/2016  Does Patient Have a Medical Advance Directive? No No No No No  Would patient like information on creating a medical advance directive? No - Patient declined No - Patient declined - - No - Patient declined    Current Medications (verified) Outpatient Encounter Medications as of 12/08/2021  Medication Sig   aspirin 81 MG chewable tablet Chew 81 mg by mouth daily.   cholecalciferol (VITAMIN D) 1000 units tablet Take 1,000 Units by mouth daily.   clopidogrel (PLAVIX) 75 MG tablet Take 75 mg by mouth daily.   pravastatin (PRAVACHOL) 10 MG tablet Take 1 tablet (10 mg total) by mouth daily.   No facility-administered encounter medications on file as of 12/08/2021.    Allergies (verified) Codeine sulfate and Penicillins   History: Past Medical History:  Diagnosis Date   Carotid aneurysm, right ([place]) 09/15/2021    CTA Neck-brain 09/17/2021 [facility]): No evidence of dissection/aneurysm of the carotid or vertebral arteries.  No significant arterial stenosis..->  No ICH, acute infarct or mass.  Secular right maxillary sinus surgery.  Major arterial structures appear normal.  No definite stenoses.  2 mm saccular outpouching along C7 segment of right ICA.   GERD (gastroesophageal reflux disease)    neg H. pylori   Hx of transient ischemic attack (TIA) 08/01/2021   Recurrent episode September 15, 2021: Normal echo.  CTA neck shows 2 mm  saccular aneurysm of right ICA.  MRI brain shows multiple subacute infarcts of left MCA territory mainly watershed areas of frontal and parietal lobes concerning for embolic infarcts.   Hyperlipidemia    Hypertension    Vitamin D deficiency    Past Surgical History:  Procedure Laterality Date   CHOLECYSTECTOMY  11/12/1992   rectal fissure repair  11/12/2000   Right Frontal Sinus Surgery Right 02/03/2018   OCULOPLASTIC SURGERY: Procedures or frontal orbital mass removal: Stereotactic computer-assisted navigational procedure-cranial/extradural; right eye socket exploration -orbitotomy with bone flap and lesion removal, right frontal sinus/transorbital exploration (frontal sinusotomy) [facility] Main OR: Dr. [name] (Opthalmology), [name] (ENT)   TRANSTHORACIC ECHOCARDIOGRAM  10/13/2021   Normal LV size and function.  EF 60 to 65%.  Normal diastolic pressure.  Normal RV.  Normal valves   TUBAL LIGATION  11/12/1993   Family History  Problem Relation Age of Onset   Hypertension Mother    Heart disease Mother    Hypercholesterolemia Brother    Colon cancer Neg Hx    Breast cancer Neg Hx    Social History   Socioeconomic History   Marital status: Married    Spouse name: Not on file   Number of children: 1   Years of education: Not on file   Highest education level: Not on file  Occupational History   Not on file  Tobacco Use   Smoking status: Former    Packs/day: 0.50    Years: 40.00  Pack years: 20.00    Types: Cigarettes    Quit date: 02/11/2020    Years since quitting: 1.8   Smokeless tobacco: Never  Vaping Use   Vaping Use: Never used  Substance and Sexual Activity   Alcohol use: No    Alcohol/week: 0.0 standard drinks   Drug use: No   Sexual activity: Not on file  Other Topics Concern   Not on file  Social History Narrative   She is very active, always on the go.  Not necessarily routine exercise, but always doing things.  Not limited by any myalgias or  arthralgias or dyspnea.   Social Determinants of Health   Financial Resource Strain: Low Risk    Difficulty of Paying Living Expenses: Not hard at all  Food Insecurity: No Food Insecurity   Worried About Charity fundraiser in the Last Year: Never true   [place] in the Last Year: Never true  Transportation Needs: No Transportation Needs   Lack of Transportation (Medical): No   Lack of Transportation (Non-Medical): No  Physical Activity: Sufficiently Active   Days of Exercise per Week: 5 days   Minutes of Exercise per Session: 30 min  Stress: No Stress Concern Present   Feeling of Stress : Not at all  Social Connections: Unknown   Frequency of Communication with Friends and Family: More than three times a week   Frequency of Social Gatherings with Friends and Family: More than three times a week   Attends Religious Services: Not on Electrical engineer or Organizations: Not on file   Attends Archivist Meetings: Not on file   Marital Status: Not on file    Tobacco Counseling Counseling given: Not Answered   Clinical Intake:  Pre-visit preparation completed: Yes        Diabetes: No  How often do you need to have someone help you when you read instructions, pamphlets, or other written materials from your doctor or pharmacy?: 1 - Never    Interpreter Needed?: No      Activities of Daily Living In your present state of health, do you have any difficulty performing the following activities: 12/08/2021  Hearing? N  Vision? N  Difficulty concentrating or making decisions? N  Walking or climbing stairs? N  Dressing or bathing? N  Doing errands, shopping? N  Preparing Food and eating ? N  Using the Toilet? N  In the past six months, have you accidently leaked urine? N  Do you have problems with loss of bowel control? N  Managing your Medications? N  Managing your Finances? N  Housekeeping or managing your Housekeeping? N  Some recent  data might be hidden    Patient Care Team: Einar Pheasant, MD as PCP - General (Internal Medicine)  Indicate any recent Medical Services you may have received from other than [facility] providers in the past year (date may be approximate).     Assessment:   This is a routine wellness examination for [place].  Virtual Visit via Telephone Note  I connected with  Carrie Wilson on 12/08/21 at 10:30 AM EST by telephone and verified that I am speaking with the correct person using two identifiers.  Persons participating in the virtual visit: patient/Nurse Health Advisor   I discussed the limitations, risks, security and privacy concerns of performing an evaluation and management service by telephone and the availability of in person appointments. The patient expressed understanding and agreed to  proceed.  Interactive audio and video telecommunications were attempted between this nurse and patient, however failed, due to patient having technical difficulties OR patient did not have access to video capability.  We continued and completed visit with audio only.  Some vital signs may be absent or patient reported.   Hearing/Vision screen Hearing Screening - Comments:: Patient is able to hear conversational tones without difficulty. No issues reported. Vision Screening - Comments:: Wears corrective lenses They have seen their ophthalmologist in the last 12 months.   Dietary issues and exercise activities discussed: Current Exercise Habits: Home exercise routine, Type of exercise: walking, Time (Minutes): 30, Frequency (Times/Week): 5, Weekly Exercise (Minutes/Week): 150, Intensity: MildHealthy diet Good water intake   Goals Addressed   None    Depression Screen PHQ 2/9 Scores 12/08/2021 12/07/2020 08/22/2020 08/19/2019 09/10/2017 09/04/2016 12/19/2015  PHQ - 2 Score 0 0 0 0 0 0 0    Fall Risk Fall Risk  12/08/2021 12/07/2020 08/22/2020 08/19/2019 09/10/2017  Falls in the past year? 0 0 0 0  No  Number falls in past yr: 0 0 - - -  Injury with Fall? - 0 - - -  Follow up Falls evaluation completed Falls evaluation completed Falls evaluation completed Falls evaluation completed -    FALL RISK PREVENTION PERTAINING TO THE HOME: Home free of loose throw rugs in walkways, pet beds, electrical cords, etc? Yes  Adequate lighting in your home to reduce risk of falls? Yes   ASSISTIVE DEVICES UTILIZED TO PREVENT FALLS: Use of a cane, walker or w/c? No   TIMED UP AND GO: Was the test performed? No .   Cognitive Function:  Patient is alert and oriented x3.    6CIT Screen 12/08/2021  What Year? 0 points  What month? 0 points  What time? 0 points  Count back from 20 0 points  Months in reverse 0 points  Repeat phrase 0 points  Total Score 0    Immunizations  There is no immunization history on file for this patient.  Health maintenance immunizations discontinued per patient.   Screening Tests Health Maintenance  Topic Date Due   INFLUENZA VACCINE  02/09/2022 (Originally 06/12/2021)   Pneumonia Vaccine 58+ Years old (1 - PCV) 09/29/2022 (Originally 10/27/1962)   COLONOSCOPY (Pts 45-34yrs Insurance coverage will need to be confirmed)  02/02/2022   FOOT EXAM  02/20/2022   HEMOGLOBIN A1C  04/19/2022   MAMMOGRAM  08/03/2022   URINE MICROALBUMIN  10/19/2022   OPHTHALMOLOGY EXAM  11/15/2022   DEXA SCAN  Completed   Hepatitis C Screening  Completed   HPV VACCINES  Aged Out   TETANUS/TDAP  Discontinued   COVID-19 Vaccine  Discontinued   Zoster Vaccines- Shingrix  Discontinued   Health Maintenance There are no preventive care reminders to display for this patient.  Lung Cancer Screening: completed 08/03/21.  Vision Screening: Recommended annual ophthalmology exams for early detection of glaucoma and other disorders of the eye.  Dental Screening: Recommended annual dental exams for proper oral hygiene  Community Resource Referral / Chronic Care Management: CRR required  this visit?  No   CCM required this visit?  No      Plan:   Keep all routine maintenance appointments.   I have personally reviewed and noted the following in the patients chart:   Medical and social history Use of alcohol, tobacco or illicit drugs  Current medications and supplements including opioid prescriptions. Not taking opioid.  Functional ability and status Nutritional status Physical  activity Advanced directives List of other physicians Hospitalizations, surgeries, and ER visits in previous 12 months Vitals Screenings to include cognitive, depression, and falls Referrals and appointments  In addition, I have reviewed and discussed with patient certain preventive protocols, quality metrics, and best practice recommendations. A written personalized care plan for preventive services as well as general preventive health recommendations were provided to patient.     Carrie Wilson, Carrie Blincoe L, LPN   01/07/7504    I have reviewed the above information and agree with above.   Deborra Medina, MD

## 2021-12-08 NOTE — Patient Instructions (Addendum)
Carrie Wilson , Thank you for taking time to come for your Medicare Wellness Visit. I appreciate your ongoing commitment to your health goals. Please review the following plan we discussed and let me know if I can assist you in the future.   These are the goals we discussed:  Goals      Maintain Healthy Lifestyle     Stay active Healthy diet         This is a list of the screening recommended for you and due dates:  Health Maintenance  Topic Date Due   Flu Shot  02/09/2022*   Pneumonia Vaccine (1 - PCV) 09/29/2022*   Colon Cancer Screening  02/02/2022   Complete foot exam   02/20/2022   Hemoglobin A1C  04/19/2022   Mammogram  08/03/2022   Urine Protein Check  10/19/2022   Eye exam for diabetics  11/15/2022   DEXA scan (bone density measurement)  Completed   Hepatitis C Screening: USPSTF Recommendation to screen - Ages 49-79 yo.  Completed   HPV Vaccine  Aged Out   Tetanus Vaccine  Discontinued   COVID-19 Vaccine  Discontinued   Zoster (Shingles) Vaccine  Discontinued  *Topic was postponed. The date shown is not the original due date.    Advanced directives: not yet completed  Conditions/risks identified: none new  Follow up in one year for your annual wellness visit    Preventive Care 65 Years and Older, Female Preventive care refers to lifestyle choices and visits with your health care provider that can promote health and wellness. What does preventive care include? A yearly physical exam. This is also called an annual well check. Dental exams once or twice a year. Routine eye exams. Ask your health care provider how often you should have your eyes checked. Personal lifestyle choices, including: Daily care of your teeth and gums. Regular physical activity. Eating a healthy diet. Avoiding tobacco and drug use. Limiting alcohol use. Practicing safe sex. Taking low-dose aspirin every day. Taking vitamin and mineral supplements as recommended by your health care  provider. What happens during an annual well check? The services and screenings done by your health care provider during your annual well check will depend on your age, overall health, lifestyle risk factors, and family history of disease. Counseling  Your health care provider may ask you questions about your: Alcohol use. Tobacco use. Drug use. Emotional well-being. Home and relationship well-being. Sexual activity. Eating habits. History of falls. Memory and ability to understand (cognition). Work and work Statistician. Reproductive health. Screening  You may have the following tests or measurements: Height, weight, and BMI. Blood pressure. Lipid and cholesterol levels. These may be checked every 5 years, or more frequently if you are over 32 years old. Skin check. Lung cancer screening. You may have this screening every year starting at age 2 if you have a 30-pack-year history of smoking and currently smoke or have quit within the past 15 years. Fecal occult blood test (FOBT) of the stool. You may have this test every year starting at age 36. Flexible sigmoidoscopy or colonoscopy. You may have a sigmoidoscopy every 5 years or a colonoscopy every 10 years starting at age 10. Hepatitis C blood test. Hepatitis B blood test. Sexually transmitted disease (STD) testing. Diabetes screening. This is done by checking your blood sugar (glucose) after you have not eaten for a while (fasting). You may have this done every 1-3 years. Bone density scan. This is done to screen for osteoporosis. You  may have this done starting at age 61. Mammogram. This may be done every 1-2 years. Talk to your health care provider about how often you should have regular mammograms. Talk with your health care provider about your test results, treatment options, and if necessary, the need for more tests. Vaccines  Your health care provider may recommend certain vaccines, such as: Influenza vaccine. This is  recommended every year. Tetanus, diphtheria, and acellular pertussis (Tdap, Td) vaccine. You may need a Td booster every 10 years. Zoster vaccine. You may need this after age 64. Pneumococcal 13-valent conjugate (PCV13) vaccine. One dose is recommended after age 71. Pneumococcal polysaccharide (PPSV23) vaccine. One dose is recommended after age 41. Talk to your health care provider about which screenings and vaccines you need and how often you need them. This information is not intended to replace advice given to you by your health care provider. Make sure you discuss any questions you have with your health care provider. Document Released: 11/25/2015 Document Revised: 07/18/2016 Document Reviewed: 08/30/2015 Elsevier Interactive Patient Education  2017 [employer] Prevention in the Home Falls can cause injuries. They can happen to people of all ages. There are many things you can do to make your home safe and to help prevent falls. What can I do on the outside of my home? Regularly fix the edges of walkways and driveways and fix any cracks. Remove anything that might make you trip as you walk through a door, such as a raised step or threshold. Trim any bushes or trees on the path to your home. Use bright outdoor lighting. Clear any walking paths of anything that might make someone trip, such as rocks or tools. Regularly check to see if handrails are loose or broken. Make sure that both sides of any steps have handrails. Any raised decks and porches should have guardrails on the edges. Have any leaves, snow, or ice cleared regularly. Use sand or salt on walking paths during winter. Clean up any spills in your garage right away. This includes oil or grease spills. What can I do in the bathroom? Use night lights. Install grab bars by the toilet and in the tub and shower. Do not use towel bars as grab bars. Use non-skid mats or decals in the tub or shower. If you need to sit down in  the shower, use a plastic, non-slip stool. Keep the floor dry. Clean up any water that spills on the floor as soon as it happens. Remove soap buildup in the tub or shower regularly. Attach bath mats securely with double-sided non-slip rug tape. Do not have throw rugs and other things on the floor that can make you trip. What can I do in the bedroom? Use night lights. Make sure that you have a light by your bed that is easy to reach. Do not use any sheets or blankets that are too big for your bed. They should not hang down onto the floor. Have a firm chair that has side arms. You can use this for support while you get dressed. Do not have throw rugs and other things on the floor that can make you trip. What can I do in the kitchen? Clean up any spills right away. Avoid walking on wet floors. Keep items that you use a lot in easy-to-reach places. If you need to reach something above you, use a strong step stool that has a grab bar. Keep electrical cords out of the way. Do not use floor  polish or wax that makes floors slippery. If you must use wax, use non-skid floor wax. Do not have throw rugs and other things on the floor that can make you trip. What can I do with my stairs? Do not leave any items on the stairs. Make sure that there are handrails on both sides of the stairs and use them. Fix handrails that are broken or loose. Make sure that handrails are as long as the stairways. Check any carpeting to make sure that it is firmly attached to the stairs. Fix any carpet that is loose or worn. Avoid having throw rugs at the top or bottom of the stairs. If you do have throw rugs, attach them to the floor with carpet tape. Make sure that you have a light switch at the top of the stairs and the bottom of the stairs. If you do not have them, ask someone to add them for you. What else can I do to help prevent falls? Wear shoes that: Do not have high heels. Have rubber bottoms. Are comfortable  and fit you well. Are closed at the toe. Do not wear sandals. If you use a stepladder: Make sure that it is fully opened. Do not climb a closed stepladder. Make sure that both sides of the stepladder are locked into place. Ask someone to hold it for you, if possible. Clearly mark and make sure that you can see: Any grab bars or handrails. First and last steps. Where the edge of each step is. Use tools that help you move around (mobility aids) if they are needed. These include: Canes. Walkers. Scooters. Crutches. Turn on the lights when you go into a dark area. Replace any light bulbs as soon as they burn out. Set up your furniture so you have a clear path. Avoid moving your furniture around. If any of your floors are uneven, fix them. If there are any pets around you, be aware of where they are. Review your medicines with your doctor. Some medicines can make you feel dizzy. This can increase your chance of falling. Ask your doctor what other things that you can do to help prevent falls. This information is not intended to replace advice given to you by your health care provider. Make sure you discuss any questions you have with your health care provider. Document Released: 08/25/2009 Document Revised: 04/05/2016 Document Reviewed: 12/03/2014 Elsevier Interactive Patient Education  2017 [employer].

## 2021-12-28 ENCOUNTER — Other Ambulatory Visit: Payer: Self-pay

## 2021-12-28 ENCOUNTER — Ambulatory Visit (INDEPENDENT_AMBULATORY_CARE_PROVIDER_SITE_OTHER): Payer: Medicare HMO | Admitting: Internal Medicine

## 2021-12-28 ENCOUNTER — Encounter: Payer: Self-pay | Admitting: Internal Medicine

## 2021-12-28 DIAGNOSIS — R0981 Nasal congestion: Secondary | ICD-10-CM | POA: Diagnosis not present

## 2021-12-28 DIAGNOSIS — Z8673 Personal history of transient ischemic attack (TIA), and cerebral infarction without residual deficits: Secondary | ICD-10-CM

## 2021-12-28 DIAGNOSIS — E559 Vitamin D deficiency, unspecified: Secondary | ICD-10-CM

## 2021-12-28 DIAGNOSIS — J439 Emphysema, unspecified: Secondary | ICD-10-CM | POA: Diagnosis not present

## 2021-12-28 DIAGNOSIS — E785 Hyperlipidemia, unspecified: Secondary | ICD-10-CM | POA: Diagnosis not present

## 2021-12-28 DIAGNOSIS — I671 Cerebral aneurysm, nonruptured: Secondary | ICD-10-CM | POA: Diagnosis not present

## 2021-12-28 DIAGNOSIS — I700 Atherosclerosis of aorta: Secondary | ICD-10-CM | POA: Diagnosis not present

## 2021-12-28 DIAGNOSIS — E1169 Type 2 diabetes mellitus with other specified complication: Secondary | ICD-10-CM | POA: Diagnosis not present

## 2021-12-28 DIAGNOSIS — E1159 Type 2 diabetes mellitus with other circulatory complications: Secondary | ICD-10-CM | POA: Diagnosis not present

## 2021-12-28 MED ORDER — DOXYCYCLINE HYCLATE 100 MG PO TABS: 100.0000 mg | ORAL_TABLET | Freq: Two times a day (BID) | ORAL | 0 refills | Status: DC

## 2021-12-28 NOTE — Progress Notes (Signed)
Patient ID: Carrie Wilson, female   DOB: 06-May-1956, 66 y.o.   MRN: [id number]   Subjective:    Patient ID: Carrie Wilson, female    DOB: 04/29/1956, 66 y.o.   MRN: [id number]  This visit occurred during the SARS-CoV-2 public health emergency.  Safety protocols were in place, including screening questions prior to the visit, additional usage of staff PPE, and extensive cleaning of exam room while observing appropriate contact time as indicated for disinfecting solutions.   Patient here for a scheduled follow up.   Chief Complaint  Patient presents with   Hyperlipidemia   Nasal Congestion   Cough   Ear Fullness   .   HPI Recent TIA - 2 episodes less than 2 months apart. On aspirin and plavix.  MRI brain - multiple small watershed distribution likely embolic infarcts.  Saw cardiology.  Discussed Loop recorder.  She wanted to hold.  Discussed with her today explaining reason for loop recorder.  Discussed possible afib.  She declines at this time.  Eating.  No vomiting.  Does report that starting 10 days ago, increased head cold.  Increased drainage.  Sore throat.  Chest congestion.  No chest pain or sob.  No acid reflux reported.  No abdominal pain.  Bowels moving.     Past Medical History:  Diagnosis Date   Carotid aneurysm, right (HCC) 09/15/2021    CTA Neck-brain 09/17/2021 [facility]): No evidence of dissection/aneurysm of the carotid or vertebral arteries.  No significant arterial stenosis..->  No ICH, acute infarct or mass.  Secular right maxillary sinus surgery.  Major arterial structures appear normal.  No definite stenoses.  2 mm saccular outpouching along C7 segment of right ICA.   GERD (gastroesophageal reflux disease)    neg H. pylori   Hx of transient ischemic attack (TIA) 08/01/2021   Recurrent episode September 15, 2021: Normal echo.  CTA neck shows 2 mm saccular aneurysm of right ICA.  MRI brain shows multiple subacute infarcts of left MCA territory mainly  watershed areas of frontal and parietal lobes concerning for embolic infarcts.   Hyperlipidemia    Hypertension    Vitamin D deficiency    Past Surgical History:  Procedure Laterality Date   CHOLECYSTECTOMY  11/12/1992   rectal fissure repair  11/12/2000   Right Frontal Sinus Surgery Right 02/03/2018   OCULOPLASTIC SURGERY: Procedures or frontal orbital mass removal: Stereotactic computer-assisted navigational procedure-cranial/extradural; right eye socket exploration -orbitotomy with bone flap and lesion removal, right frontal sinus/transorbital exploration (frontal sinusotomy) [facility] Main OR: Dr. Benny Lennert (Opthalmology), Dr Arlys John D. Thorp (ENT)   TRANSTHORACIC ECHOCARDIOGRAM  10/13/2021   Normal LV size and function.  EF 60 to 65%.  Normal diastolic pressure.  Normal RV.  Normal valves   TUBAL LIGATION  11/12/1993   Family History  Problem Relation Age of Onset   Hypertension Mother    Heart disease Mother    Hypercholesterolemia Brother    Colon cancer Neg Hx    Breast cancer Neg Hx    Social History   Socioeconomic History   Marital status: Married    Spouse name: Not on file   Number of children: 1   Years of education: Not on file   Highest education level: Not on file  Occupational History   Not on file  Tobacco Use   Smoking status: Former    Packs/day: 0.50    Years: 40.00    Pack years: 20.00    Types: Cigarettes  Quit date: 02/11/2020    Years since quitting: 1.8   Smokeless tobacco: Never  Vaping Use   Vaping Use: Never used  Substance and Sexual Activity   Alcohol use: No    Alcohol/week: 0.0 standard drinks   Drug use: No   Sexual activity: Not on file  Other Topics Concern   Not on file  Social History Narrative   She is very active, always on the go.  Not necessarily routine exercise, but always doing things.  Not limited by any myalgias or arthralgias or dyspnea.   Social Determinants of Health   Financial Resource Strain: Low Risk     Difficulty of Paying Living Expenses: Not hard at all  Food Insecurity: No Food Insecurity   Worried About Programme researcher, broadcasting/film/video in the Last Year: Never true   Ran Out of Food in the Last Year: Never true  Transportation Needs: No Transportation Needs   Lack of Transportation (Medical): No   Lack of Transportation (Non-Medical): No  Physical Activity: Sufficiently Active   Days of Exercise per Week: 5 days   Minutes of Exercise per Session: 30 min  Stress: No Stress Concern Present   Feeling of Stress : Not at all  Social Connections: Unknown   Frequency of Communication with Friends and Family: More than three times a week   Frequency of Social Gatherings with Friends and Family: More than three times a week   Attends Religious Services: Not on Scientist, clinical (histocompatibility and immunogenetics) or Organizations: Not on file   Attends Banker Meetings: Not on file   Marital Status: Not on file     Review of Systems  Constitutional:  Negative for appetite change and unexpected weight change.  HENT:  Positive for congestion, postnasal drip and sinus pressure.   Respiratory:  Negative for cough, chest tightness and shortness of breath.        Chest congestion.   Cardiovascular:  Negative for chest pain, palpitations and leg swelling.  Gastrointestinal:  Negative for abdominal pain, diarrhea, nausea and vomiting.  Genitourinary:  Negative for difficulty urinating and dysuria.  Musculoskeletal:  Negative for joint swelling and myalgias.  Skin:  Negative for color change and rash.  Neurological:  Negative for dizziness, light-headedness and headaches.  Psychiatric/Behavioral:  Negative for agitation and dysphoric mood.       Objective:     BP 122/70    Pulse 93    Temp 97.9 F (36.6 C)    Resp 16    Ht 5\' 8"  (1.727 m)    Wt 173 lb 6.4 oz (78.7 kg)    LMP 12/08/1997    SpO2 97%    BMI 26.37 kg/m  Wt Readings from Last 3 Encounters:  12/28/21 173 lb 6.4 oz (78.7 kg)  12/08/21 175 lb (79.4  kg)  11/30/21 175 lb (79.4 kg)    Physical Exam Vitals reviewed.  Constitutional:      General: She is not in acute distress.    Appearance: Normal appearance.  HENT:     Head: Normocephalic and atraumatic.     Right Ear: External ear normal.     Left Ear: External ear normal.  Eyes:     General: No scleral icterus.       Right eye: No discharge.        Left eye: No discharge.     Conjunctiva/sclera: Conjunctivae normal.  Neck:     Thyroid: No thyromegaly.  Cardiovascular:  Rate and Rhythm: Normal rate and regular rhythm.  Pulmonary:     Effort: No respiratory distress.     Breath sounds: Normal breath sounds. No wheezing.  Abdominal:     General: Bowel sounds are normal.     Palpations: Abdomen is soft.     Tenderness: There is no abdominal tenderness.  Musculoskeletal:        General: No swelling or tenderness.     Cervical back: Neck supple. No tenderness.  Lymphadenopathy:     Cervical: No cervical adenopathy.  Skin:    Findings: No erythema or rash.  Neurological:     Mental Status: She is alert.  Psychiatric:        Mood and Affect: Mood normal.        Behavior: Behavior normal.     Outpatient Encounter Medications as of 12/28/2021  Medication Sig   doxycycline (VIBRA-TABS) 100 MG tablet Take 1 tablet (100 mg total) by mouth 2 (two) times daily.   aspirin 81 MG chewable tablet Chew 81 mg by mouth daily.   cholecalciferol (VITAMIN D) 1000 units tablet Take 1,000 Units by mouth daily.   clopidogrel (PLAVIX) 75 MG tablet Take 75 mg by mouth daily.   pravastatin (PRAVACHOL) 10 MG tablet Take 1 tablet (10 mg total) by mouth daily.   No facility-administered encounter medications on file as of 12/28/2021.     Lab Results  Component Value Date   WBC 6.3 02/20/2021   HGB 14.3 02/20/2021   HCT 43.2 02/20/2021   PLT 191.0 02/20/2021   GLUCOSE 91 10/19/2021   CHOL 154 10/19/2021   TRIG 127.0 10/19/2021   HDL 42.40 10/19/2021   LDLDIRECT 110.0 06/18/2018    LDLCALC 86 10/19/2021   ALT 10 10/19/2021   AST 13 10/19/2021   NA 142 10/19/2021   K 4.1 10/19/2021   CL 106 10/19/2021   CREATININE 0.92 10/19/2021   BUN 11 10/19/2021   CO2 30 10/19/2021   TSH 2.03 02/20/2021   HGBA1C 6.4 10/19/2021   MICROALBUR 1.6 10/19/2021    MM DIAG BREAST TOMO UNI LEFT  Result Date: 08/18/2021 CLINICAL DATA:  Possible asymmetry central left breast on a recent screening mammogram. EXAM: DIGITAL DIAGNOSTIC UNILATERAL LEFT MAMMOGRAM WITH TOMOSYNTHESIS AND CAD TECHNIQUE: Left digital diagnostic mammography and breast tomosynthesis was performed. The images were evaluated with computer-aided detection. COMPARISON:  Previous exam(s). ACR Breast Density Category c: The breast tissue is heterogeneously dense, which may obscure small masses. FINDINGS: 3D tomographic and 2D generated spot compression images of the left breast demonstrate normal appearing fibroglandular tissue at the location of the recently suspected asymmetry, unchanged compared to previous examinations. IMPRESSION: No evidence of malignancy. The recently suspected left breast asymmetry was close apposition of normal breast tissue. RECOMMENDATION: Bilateral screening mammogram in 1 year. I have discussed the findings and recommendations with the patient. If applicable, a reminder letter will be sent to the patient regarding the next appointment. BI-RADS CATEGORY  1: Negative. Electronically Signed   By: Beckie Salts M.D.   On: 08/18/2021 14:31      Assessment & Plan:   Problem List Items Addressed This Visit     Emphysema lung (HCC)    With increased congestion and cough as outlined.  Discussed mucinex/robitussin.  Steroid nasal spray/saline nasal spray as directed.  Doxycycline.  Probiotics.  Follow.  Call wit update.       Head congestion    Increased head congestion, increased drainage and chest congestion.  Doxycycline.  mucinex and flonase as directed.  Follow.  Call with update.        Vitamin D  deficiency    Continue vitamin D supplements      Aortic atherosclerosis (HCC) (Chronic)    On pravastatin.       Cerebral aneurysm without rupture (Chronic)    On aspirin and plavix.  Follow.        Controlled type 2 diabetes mellitus with circulatory disorder (HCC) (Chronic)    Sugars better off prednisone.  Check met b and a1c.  Low carb diet and exercise.        Relevant Orders   Hemoglobin A1c   Basic metabolic panel   History of TIA (transient ischemic attack) (Chronic)    Recurrent TIA (less than two months apart).  MRI as outlind.  (likely embolic infarcts).  Concern for afib as an etiology.  Saw cardiology.  Discussed loop recorder.  She declines at this time.  Continue aspirin and plavix.        Hyperlipidemia associated with type 2 diabetes mellitus (HCC) (Chronic)    On pravastatin.  Just recently increased dose.  Low cholesterol diet and exercise.  Follow lipidpanel and liver function tests.       Relevant Orders   Lipid panel   Hepatic function panel     Dale [place], MD

## 2021-12-28 NOTE — Patient Instructions (Signed)
Flonase nasal spray - 2 sprays each nostril one time per day.  Do this in the evening.    Take the mucinex  Take a probiotic daily while you are on the antibiotic and for two weeks after completing the antibiotic.

## 2021-12-31 ENCOUNTER — Encounter: Payer: Self-pay | Admitting: Internal Medicine

## 2021-12-31 DIAGNOSIS — R0981 Nasal congestion: Secondary | ICD-10-CM | POA: Insufficient documentation

## 2021-12-31 NOTE — Assessment & Plan Note (Signed)
Recurrent TIA (less than two months apart).  MRI as outlind.  (likely embolic infarcts).  Concern for afib as an etiology.  Saw cardiology.  Discussed loop recorder.  She declines at this time.  Continue aspirin and plavix.

## 2021-12-31 NOTE — Assessment & Plan Note (Signed)
Continue vitamin D supplements.  

## 2021-12-31 NOTE — Assessment & Plan Note (Signed)
Increased head congestion, increased drainage and chest congestion.  Doxycycline. mucinex and flonase as directed.  Follow.  Call with update.

## 2021-12-31 NOTE — Assessment & Plan Note (Signed)
On pravastatin.  Just recently increased dose.  Low cholesterol diet and exercise.  Follow lipidpanel and liver function tests.

## 2021-12-31 NOTE — Assessment & Plan Note (Signed)
On pravastatin.   

## 2021-12-31 NOTE — Assessment & Plan Note (Signed)
On aspirin and plavix.  Follow.

## 2021-12-31 NOTE — Assessment & Plan Note (Signed)
Sugars better off prednisone.  Check met b and a1c.  Low carb diet and exercise.   

## 2021-12-31 NOTE — Assessment & Plan Note (Signed)
With increased congestion and cough as outlined.  Discussed mucinex/robitussin.  Steroid nasal spray/saline nasal spray as directed.  Doxycycline.  Probiotics.  Follow.  Call wit update.

## 2022-01-03 ENCOUNTER — Institutional Professional Consult (permissible substitution): Payer: Medicare HMO | Admitting: Cardiology

## 2022-02-02 ENCOUNTER — Telehealth: Payer: Self-pay | Admitting: *Deleted

## 2022-02-02 NOTE — Telephone Encounter (Signed)
Spoke with patient about scheduling Yearly Lung CA CT Screening. She stated she is getting ready to go on vacation and requested to be called back in several weeks. ?

## 2022-03-01 ENCOUNTER — Encounter: Payer: Self-pay | Admitting: Internal Medicine

## 2022-03-01 ENCOUNTER — Ambulatory Visit (INDEPENDENT_AMBULATORY_CARE_PROVIDER_SITE_OTHER): Payer: Medicare HMO | Admitting: Internal Medicine

## 2022-03-01 DIAGNOSIS — E1159 Type 2 diabetes mellitus with other circulatory complications: Secondary | ICD-10-CM | POA: Diagnosis not present

## 2022-03-01 DIAGNOSIS — I700 Atherosclerosis of aorta: Secondary | ICD-10-CM | POA: Diagnosis not present

## 2022-03-01 DIAGNOSIS — Z8673 Personal history of transient ischemic attack (TIA), and cerebral infarction without residual deficits: Secondary | ICD-10-CM | POA: Diagnosis not present

## 2022-03-01 DIAGNOSIS — E1169 Type 2 diabetes mellitus with other specified complication: Secondary | ICD-10-CM | POA: Diagnosis not present

## 2022-03-01 DIAGNOSIS — I671 Cerebral aneurysm, nonruptured: Secondary | ICD-10-CM | POA: Diagnosis not present

## 2022-03-01 DIAGNOSIS — E785 Hyperlipidemia, unspecified: Secondary | ICD-10-CM

## 2022-03-01 DIAGNOSIS — J439 Emphysema, unspecified: Secondary | ICD-10-CM | POA: Diagnosis not present

## 2022-03-01 LAB — HEPATIC FUNCTION PANEL
ALT: 12 U/L (ref 0–35)
AST: 15 U/L (ref 0–37)
Albumin: 4.1 g/dL (ref 3.5–5.2)
Alkaline Phosphatase: 70 U/L (ref 39–117)
Bilirubin, Direct: 0.1 mg/dL (ref 0.0–0.3)
Total Bilirubin: 0.6 mg/dL (ref 0.2–1.2)
Total Protein: 6.6 g/dL (ref 6.0–8.3)

## 2022-03-01 LAB — BASIC METABOLIC PANEL
BUN: 9 mg/dL (ref 6–23)
CO2: 30 mEq/L (ref 19–32)
Calcium: 8.8 mg/dL (ref 8.4–10.5)
Chloride: 106 mEq/L (ref 96–112)
Creatinine, Ser: 0.89 mg/dL (ref 0.40–1.20)
GFR: 67.12 mL/min (ref >60.00)
Glucose, Bld: 94 mg/dL (ref 70–99)
Potassium: 4.1 mEq/L (ref 3.5–5.1)
Sodium: 142 mEq/L (ref 135–145)

## 2022-03-01 LAB — LIPID PANEL
Cholesterol: 177 mg/dL (ref 0–200)
HDL: 44.9 mg/dL (ref >39.00)
LDL Cholesterol: 110 mg/dL — ABNORMAL HIGH (ref 0–99)
NonHDL: 131.77
Total CHOL/HDL Ratio: 4
Triglycerides: 107 mg/dL (ref 0.0–149.0)
VLDL: 21.4 mg/dL (ref 0.0–40.0)

## 2022-03-01 LAB — HEMOGLOBIN A1C: Hgb A1c MFr Bld: 6.3 % (ref 4.6–6.5)

## 2022-03-01 NOTE — Progress Notes (Signed)
Patient ID: Carrie Wilson, female   DOB: 1955-12-12, 66 y.o.   MRN: [id number] ? ? ?Subjective:  ? ? Patient ID: Carrie Wilson, female    DOB: 01/17/1956, 66 y.o.   MRN: [id number] ? ?This visit occurred during the SARS-CoV-2 public health emergency.  Safety protocols were in place, including screening questions prior to the visit, additional usage of staff PPE, and extensive cleaning of exam room while observing appropriate contact time as indicated for disinfecting solutions.  ? ?Patient here for a scheduled follow up.  ? ?Chief Complaint  ?Patient presents with  ? Follow-up  ?  68mo f/u - HTN, HLD, DM  ? .  ? ?HPI ?Reports she is doing well.  Feels good.  Stays active. No chest pain or sob reported.  No abdominal pain.  Bowels moving.  No urine change reported.  Works outside.  Retired.  No further "episodes". Blood pressure doing well on no medication. Not smoking.  ? ? ?Past Medical History:  ?Diagnosis Date  ? Carotid aneurysm, right (HCC) 09/15/2021  ?  CTA Neck-brain 09/17/2021 [facility]): No evidence of dissection/aneurysm of the carotid or vertebral arteries.  No significant arterial stenosis..->  No ICH, acute infarct or mass.  Secular right maxillary sinus surgery.  Major arterial structures appear normal.  No definite stenoses.  2 mm saccular outpouching along C7 segment of right ICA.  ? GERD (gastroesophageal reflux disease)   ? neg H. pylori  ? Hx of transient ischemic attack (TIA) 08/01/2021  ? Recurrent episode September 15, 2021: Normal echo.  CTA neck shows 2 mm saccular aneurysm of right ICA.  MRI brain shows multiple subacute infarcts of left MCA territory mainly watershed areas of frontal and parietal lobes concerning for embolic infarcts.  ? Hyperlipidemia   ? Hypertension   ? Vitamin D deficiency   ? ?Past Surgical History:  ?Procedure Laterality Date  ? CHOLECYSTECTOMY  11/12/1992  ? rectal fissure repair  11/12/2000  ? Right Frontal Sinus Surgery Right 02/03/2018  ? OCULOPLASTIC  SURGERY: Procedures or frontal orbital mass removal: Stereotactic computer-assisted navigational procedure-cranial/extradural; right eye socket exploration -orbitotomy with bone flap and lesion removal, right frontal sinus/transorbital exploration (frontal sinusotomy) [facility] Main OR: Dr. Benny Lennert (Opthalmology), Dr Arlys John D. Thorp (ENT)  ? TRANSTHORACIC ECHOCARDIOGRAM  10/13/2021  ? Normal LV size and function.  EF 60 to 65%.  Normal diastolic pressure.  Normal RV.  Normal valves  ? TUBAL LIGATION  11/12/1993  ? ?Family History  ?Problem Relation Age of Onset  ? Hypertension Mother   ? Heart disease Mother   ? Hypercholesterolemia Brother   ? Colon cancer Neg Hx   ? Breast cancer Neg Hx   ? ?Social History  ? ?Socioeconomic History  ? Marital status: Married  ?  Spouse name: Not on file  ? Number of children: 1  ? Years of education: Not on file  ? Highest education level: Not on file  ?Occupational History  ? Not on file  ?Tobacco Use  ? Smoking status: Former  ?  Packs/day: 0.50  ?  Years: 40.00  ?  Pack years: 20.00  ?  Types: Cigarettes  ?  Quit date: 02/11/2020  ?  Years since quitting: 2.0  ? Smokeless tobacco: Never  ?Vaping Use  ? Vaping Use: Never used  ?Substance and Sexual Activity  ? Alcohol use: No  ?  Alcohol/week: 0.0 standard drinks  ? Drug use: No  ? Sexual activity: Not on file  ?Other  Topics Concern  ? Not on file  ?Social History Narrative  ? She is very active, always on the go.  Not necessarily routine exercise, but always doing things.  Not limited by any myalgias or arthralgias or dyspnea.  ? ?Social Determinants of Health  ? ?Financial Resource Strain: Low Risk   ? Difficulty of Paying Living Expenses: Not hard at all  ?Food Insecurity: No Food Insecurity  ? Worried About Programme researcher, broadcasting/film/video in the Last Year: Never true  ? Ran Out of Food in the Last Year: Never true  ?Transportation Needs: No Transportation Needs  ? Lack of Transportation (Medical): No  ? Lack of Transportation (Non-Medical):  No  ?Physical Activity: Sufficiently Active  ? Days of Exercise per Week: 5 days  ? Minutes of Exercise per Session: 30 min  ?Stress: No Stress Concern Present  ? Feeling of Stress : Not at all  ?Social Connections: Unknown  ? Frequency of Communication with Friends and Family: More than three times a week  ? Frequency of Social Gatherings with Friends and Family: More than three times a week  ? Attends Religious Services: Not on file  ? Active Member of Clubs or Organizations: Not on file  ? Attends Banker Meetings: Not on file  ? Marital Status: Not on file  ? ? ? ?Review of Systems  ?Constitutional:  Negative for appetite change and unexpected weight change.  ?HENT:  Negative for congestion and sinus pressure.   ?Respiratory:  Negative for cough, chest tightness and shortness of breath.   ?Cardiovascular:  Negative for chest pain, palpitations and leg swelling.  ?Gastrointestinal:  Negative for abdominal pain, diarrhea, nausea and vomiting.  ?Genitourinary:  Negative for difficulty urinating and dysuria.  ?Musculoskeletal:  Negative for joint swelling and myalgias.  ?Skin:  Negative for color change and rash.  ?Neurological:  Negative for dizziness, light-headedness and headaches.  ?Psychiatric/Behavioral:  Negative for agitation and dysphoric mood.   ? ?   ?Objective:  ?  ? ?BP 118/70 (BP Location: Left Arm, Patient Position: Sitting, Cuff Size: Small)   Pulse 64   Temp 97.9 ?F (36.6 ?C) (Temporal)   Resp 14   Ht 5\' 8"  (1.727 m)   Wt 174 lb 9.6 oz (79.2 kg)   LMP 12/08/1997   SpO2 98%   BMI 26.55 kg/m?  ?Wt Readings from Last 3 Encounters:  ?03/01/22 174 lb 9.6 oz (79.2 kg)  ?12/28/21 173 lb 6.4 oz (78.7 kg)  ?12/08/21 175 lb (79.4 kg)  ? ? ?Physical Exam ?Vitals reviewed.  ?Constitutional:   ?   General: She is not in acute distress. ?   Appearance: Normal appearance.  ?HENT:  ?   Head: Normocephalic and atraumatic.  ?   Right Ear: External ear normal.  ?   Left Ear: External ear normal.   ?Eyes:  ?   General: No scleral icterus.    ?   Right eye: No discharge.     ?   Left eye: No discharge.  ?   Conjunctiva/sclera: Conjunctivae normal.  ?Neck:  ?   Thyroid: No thyromegaly.  ?Cardiovascular:  ?   Rate and Rhythm: Normal rate and regular rhythm.  ?Pulmonary:  ?   Effort: No respiratory distress.  ?   Breath sounds: Normal breath sounds. No wheezing.  ?Abdominal:  ?   General: Bowel sounds are normal.  ?   Palpations: Abdomen is soft.  ?   Tenderness: There is no abdominal tenderness.  ?  Musculoskeletal:     ?   General: No swelling or tenderness.  ?   Cervical back: Neck supple. No tenderness.  ?Lymphadenopathy:  ?   Cervical: No cervical adenopathy.  ?Skin: ?   Findings: No erythema or rash.  ?Neurological:  ?   Mental Status: She is alert.  ?Psychiatric:     ?   Mood and Affect: Mood normal.     ?   Behavior: Behavior normal.  ? ? ? ?Outpatient Encounter Medications as of 03/01/2022  ?Medication Sig  ? aspirin 81 MG chewable tablet Chew 81 mg by mouth daily.  ? cholecalciferol (VITAMIN D) 1000 units tablet Take 1,000 Units by mouth daily.  ? clopidogrel (PLAVIX) 75 MG tablet Take 75 mg by mouth daily.  ? pravastatin (PRAVACHOL) 10 MG tablet Take 1 tablet (10 mg total) by mouth daily.  ? [DISCONTINUED] doxycycline (VIBRA-TABS) 100 MG tablet Take 1 tablet (100 mg total) by mouth 2 (two) times daily. (Patient not taking: Reported on 03/01/2022)  ? ?No facility-administered encounter medications on file as of 03/01/2022.  ?  ? ?Lab Results  ?Component Value Date  ? WBC 6.3 02/20/2021  ? HGB 14.3 02/20/2021  ? HCT 43.2 02/20/2021  ? PLT 191.0 02/20/2021  ? GLUCOSE 94 03/01/2022  ? CHOL 177 03/01/2022  ? TRIG 107.0 03/01/2022  ? HDL 44.90 03/01/2022  ? LDLDIRECT 110.0 06/18/2018  ? LDLCALC 110 (H) 03/01/2022  ? ALT 12 03/01/2022  ? AST 15 03/01/2022  ? NA 142 03/01/2022  ? K 4.1 03/01/2022  ? CL 106 03/01/2022  ? CREATININE 0.89 03/01/2022  ? BUN 9 03/01/2022  ? CO2 30 03/01/2022  ? TSH 2.03 02/20/2021  ?  HGBA1C 6.3 03/01/2022  ? MICROALBUR 1.6 10/19/2021  ? ? ?MM DIAG BREAST TOMO UNI LEFT ? ?Result Date: 08/18/2021 ?CLINICAL DATA:  Possible asymmetry central left breast on a recent screening mammogram. EXAM: DIGITA

## 2022-03-02 ENCOUNTER — Telehealth: Payer: Self-pay

## 2022-03-02 ENCOUNTER — Encounter: Payer: Self-pay | Admitting: Internal Medicine

## 2022-03-02 MED ORDER — PRAVASTATIN SODIUM 20 MG PO TABS: 20.0000 mg | ORAL_TABLET | Freq: Every day | ORAL | 1 refills | Status: DC

## 2022-03-02 NOTE — Assessment & Plan Note (Signed)
Low carb diet and exercise.  Previously on prednisone.  Off now.  Sugars better.  Follow met b and a1c.  ?

## 2022-03-02 NOTE — Assessment & Plan Note (Signed)
On pravastatin.  Just recently increased dose.  Low cholesterol diet and exercise.  Follow lipidpanel and liver function tests.  ?

## 2022-03-02 NOTE — Telephone Encounter (Signed)
Sent!

## 2022-03-02 NOTE — Telephone Encounter (Signed)
Lm for pt to cb re: results ? ?Notify - cholesterol increased some from last check.  Confirm taking pravastatin '10mg'$  q day.  Given her history, I would like to increase pravastatin to '20mg'$  q day.  Overall sugar control relatively stable.  (Slightly improved).  Continue low carb diet and exercise.  We will follow.  Liver function tests wnl.  ?

## 2022-03-02 NOTE — Assessment & Plan Note (Signed)
On pravastatin.   

## 2022-03-02 NOTE — Telephone Encounter (Signed)
Patient voiced understanding of lab results. She is requesting her pravastatin 20 mg called to the pharmacy today. ?

## 2022-03-02 NOTE — Assessment & Plan Note (Signed)
Breathing stable.  No cough.  No sob.  Follow.  

## 2022-03-02 NOTE — Assessment & Plan Note (Addendum)
On aspirin and plavix.  Blood pressure controlled.  Discuss with vascular regarding question of need for f/u.   ?

## 2022-03-02 NOTE — Telephone Encounter (Signed)
-----   Message from Einar Pheasant, MD sent at 03/02/2022  4:56 AM EDT ----- ?Notify - cholesterol increased some from last check.  Confirm taking pravastatin '10mg'$  q day.  Given her history, I would like to increase pravastatin to '20mg'$  q day.  Overall sugar control relatively stable.  (Slightly improved).  Continue low carb diet and exercise.  We will follow.  Liver function tests wnl.  ?

## 2022-03-02 NOTE — Assessment & Plan Note (Signed)
Recurrent TIA (less than two months apart).  MRI as outlind.  (likely embolic infarcts).  Concern for afib as an etiology.  Saw cardiology.  Discussed loop recorder.  She declines at this time.  Continue aspirin and plavix.   ?

## 2022-03-27 ENCOUNTER — Telehealth: Payer: Self-pay | Admitting: Internal Medicine

## 2022-03-27 DIAGNOSIS — I671 Cerebral aneurysm, nonruptured: Secondary | ICD-10-CM

## 2022-03-27 NOTE — Telephone Encounter (Signed)
Discussed CTA finding with Ms Jacquez.  Discussed [facility] referral to confirm if any further intervention warranted and need for f/u.  Agreeable to referral. Order placed for [facility] referral.  Preferred [facility].  ?

## 2022-05-28 NOTE — Telephone Encounter (Signed)
Ok to decrease pravastatin back to '10mg'$ . See if symptoms improve.  Call with update.

## 2022-05-28 NOTE — Telephone Encounter (Signed)
Pt called in stating that Dr. Nicki Reaper prescribe medication (pravastatin (PRAVACHOL) 10 MG tablet) and then increase medication to (pravastatin (PRAVACHOL) 20 MG tablet)... Pt stated that the medication '20mg'$  is making her legs, arms, and knees hurts.... Pt stated that being on the '10mg'$  it didn't make her hurt.... Pt is requesting to go back to the '10mg'$ ... Pt requesting callback.Marland KitchenMarland Kitchen

## 2022-05-29 ENCOUNTER — Other Ambulatory Visit: Payer: Self-pay

## 2022-05-29 MED ORDER — PRAVASTATIN SODIUM 10 MG PO TABS: 10.0000 mg | ORAL_TABLET | Freq: Every day | ORAL | 1 refills | Status: DC

## 2022-05-29 NOTE — Telephone Encounter (Signed)
Pt advised. Asked for rx to be sent to [facility] of '10mg'$  dose. Rx sent.

## 2022-07-03 ENCOUNTER — Encounter: Payer: Self-pay | Admitting: Internal Medicine

## 2022-07-03 ENCOUNTER — Ambulatory Visit (INDEPENDENT_AMBULATORY_CARE_PROVIDER_SITE_OTHER): Payer: Medicare HMO | Admitting: Internal Medicine

## 2022-07-03 VITALS — BP 114/72 | HR 77 | Temp 97.6°F | Resp 15 | Ht 68.0 in | Wt 179.2 lb

## 2022-07-03 DIAGNOSIS — E1169 Type 2 diabetes mellitus with other specified complication: Secondary | ICD-10-CM | POA: Diagnosis not present

## 2022-07-03 DIAGNOSIS — Z8673 Personal history of transient ischemic attack (TIA), and cerebral infarction without residual deficits: Secondary | ICD-10-CM | POA: Diagnosis not present

## 2022-07-03 DIAGNOSIS — J439 Emphysema, unspecified: Secondary | ICD-10-CM | POA: Diagnosis not present

## 2022-07-03 DIAGNOSIS — D582 Other hemoglobinopathies: Secondary | ICD-10-CM

## 2022-07-03 DIAGNOSIS — I671 Cerebral aneurysm, nonruptured: Secondary | ICD-10-CM | POA: Diagnosis not present

## 2022-07-03 DIAGNOSIS — E785 Hyperlipidemia, unspecified: Secondary | ICD-10-CM | POA: Diagnosis not present

## 2022-07-03 DIAGNOSIS — E1159 Type 2 diabetes mellitus with other circulatory complications: Secondary | ICD-10-CM | POA: Diagnosis not present

## 2022-07-03 DIAGNOSIS — I700 Atherosclerosis of aorta: Secondary | ICD-10-CM

## 2022-07-03 DIAGNOSIS — I10 Essential (primary) hypertension: Secondary | ICD-10-CM

## 2022-07-03 DIAGNOSIS — Z720 Tobacco use: Secondary | ICD-10-CM | POA: Diagnosis not present

## 2022-07-03 LAB — BASIC METABOLIC PANEL
BUN: 9 mg/dL (ref 6–23)
CO2: 29 mEq/L (ref 19–32)
Calcium: 9.1 mg/dL (ref 8.4–10.5)
Chloride: 104 mEq/L (ref 96–112)
Creatinine, Ser: 0.85 mg/dL (ref 0.40–1.20)
GFR: 70.76 mL/min (ref >60.00)
Glucose, Bld: 96 mg/dL (ref 70–99)
Potassium: 4.4 mEq/L (ref 3.5–5.1)
Sodium: 141 mEq/L (ref 135–145)

## 2022-07-03 LAB — LIPID PANEL
Cholesterol: 184 mg/dL (ref 0–200)
HDL: 44.5 mg/dL (ref >39.00)
LDL Cholesterol: 112 mg/dL — ABNORMAL HIGH (ref 0–99)
NonHDL: 139.57
Total CHOL/HDL Ratio: 4
Triglycerides: 139 mg/dL (ref 0.0–149.0)
VLDL: 27.8 mg/dL (ref 0.0–40.0)

## 2022-07-03 LAB — HEPATIC FUNCTION PANEL
ALT: 11 U/L (ref 0–35)
AST: 12 U/L (ref 0–37)
Albumin: 4.1 g/dL (ref 3.5–5.2)
Alkaline Phosphatase: 78 U/L (ref 39–117)
Bilirubin, Direct: 0.1 mg/dL (ref 0.0–0.3)
Total Bilirubin: 0.7 mg/dL (ref 0.2–1.2)
Total Protein: 6.4 g/dL (ref 6.0–8.3)

## 2022-07-03 LAB — HM DIABETES FOOT EXAM

## 2022-07-03 LAB — HEMOGLOBIN A1C: Hgb A1c MFr Bld: 6.5 % (ref 4.6–6.5)

## 2022-07-03 MED ORDER — PRAVASTATIN SODIUM 10 MG PO TABS: 10.0000 mg | ORAL_TABLET | ORAL | 2 refills | Status: DC

## 2022-07-03 MED ORDER — CLOPIDOGREL BISULFATE 75 MG PO TABS: 75.0000 mg | ORAL_TABLET | Freq: Every day | ORAL | 3 refills | Status: DC

## 2022-07-03 NOTE — Progress Notes (Signed)
Patient ID: Carrie Wilson, female   DOB: 06-Jan-1956, 66 y.o.   MRN: [id number]   Subjective:    Patient ID: Carrie Wilson, female    DOB: 08/07/1956, 66 y.o.   MRN: [id number]   Patient here for a scheduled follow up.   Chief Complaint  Patient presents with   Diabetes   Hyperlipidemia   Hypertension   .   HPI Here to follow up regarding her blood pressure, blood sugar, cholesterol and recent CVA.  Previous CTA - 09/2021 - 48m saccular outpouching of right ICA.  Had referred to [facility].  States she spoke to specialist and they recommended her to f/u in 3 years.  Denies headache or dizziness.  No chest pain or sob reported.  No abdominal pain.  Bowels moving. Overall feels good. Her daughter and granddaughter check her blood pressure - doing well.    Past Medical History:  Diagnosis Date   Carotid aneurysm, right ([facility] 09/15/2021    [facility] No evidence of dissection/aneurysm of the carotid or vertebral arteries.  No significant arterial stenosis..->  No ICH, acute infarct or mass.  Secular right maxillary sinus surgery.  Major arterial structures appear normal.  No definite stenoses.  2 mm saccular outpouching along C7 segment of right ICA.   GERD (gastroesophageal reflux disease)    neg H. pylori   Hx of transient ischemic attack (TIA) 08/01/2021   Recurrent episode September 15, 2021: Normal echo.  CTA neck shows 2 mm saccular aneurysm of right ICA.  MRI brain shows multiple subacute infarcts of left MCA territory mainly watershed areas of frontal and parietal lobes concerning for embolic infarcts.   Hyperlipidemia    Hypertension    Vitamin D deficiency    Past Surgical History:  Procedure Laterality Date   CHOLECYSTECTOMY  11/12/1992   rectal fissure repair  11/12/2000   Right Frontal Sinus Surgery Right 02/03/2018   OCULOPLASTIC SURGERY: Procedures or frontal orbital mass removal: Stereotactic computer-assisted navigational  procedure-cranial/extradural; right eye socket exploration -orbitotomy with bone flap and lesion removal, right frontal sinus/transorbital exploration ([facility]) [facility] Dr. EJoana Reamer(Opthalmology), Dr BAaron EdelmanD. Thorp (ENT)   TRANSTHORACIC ECHOCARDIOGRAM  10/13/2021   Normal LV size and function.  EF 60 to 65%.  Normal diastolic pressure.  Normal RV.  Normal valves   TUBAL LIGATION  11/12/1993   Family History  Problem Relation Age of Onset   Hypertension Mother    Heart disease Mother    Hypercholesterolemia Brother    Colon cancer Neg Hx    Breast cancer Neg Hx    Social History   Socioeconomic History   Marital status: Married    Spouse name: Not on file   Number of children: 1   Years of education: Not on file   Highest education level: Not on file  Occupational History   Not on file  Tobacco Use   Smoking status: Former    Packs/day: 0.50    Years: 40.00    Total pack years: 20.00    Types: Cigarettes    Quit date: 02/11/2020    Years since quitting: 2.4   Smokeless tobacco: Never  Vaping Use   Vaping Use: Never used  Substance and Sexual Activity   Alcohol use: No    Alcohol/week: 0.0 standard drinks of alcohol   Drug use: No   Sexual activity: Not on file  Other Topics Concern   Not on file  Social History Narrative  Patient ID: Carrie Wilson, female   DOB: 08-25-1956, 66 y.o.   MRN: [id number]   Subjective:    Patient ID: Carrie Wilson, female    DOB: 15-Jan-1956, 66 y.o.   MRN: [id number]   Patient here for a scheduled follow up.   Chief Complaint  Patient presents with   Diabetes   Hyperlipidemia   Hypertension   .   HPI Here to follow up regarding her blood pressure, blood sugar, cholesterol and recent CVA.  Previous CTA - 09/2021 - 31m saccular outpouching of right ICA.  [facility].  States she spoke to specialist and they recommended her to f/u in 3 years.  Denies headache or dizziness.  No chest pain or sob reported.  No abdominal pain.  Bowels moving. Overall feels good. Her daughter and granddaughter check her blood pressure - doing well.    Past Medical History:  Diagnosis Date   Carotid aneurysm, right ([facility] 09/15/2021    [facility] No evidence of dissection/aneurysm of the carotid or vertebral arteries.  No significant arterial stenosis..->  No ICH, acute infarct or mass.  Secular right maxillary sinus surgery.  Major arterial structures appear normal.  No definite stenoses.  2 mm saccular outpouching along C7 segment of right ICA.   GERD (gastroesophageal reflux disease)    neg H. pylori   Hx of transient ischemic attack (TIA) 08/01/2021   Recurrent episode September 15, 2021: Normal echo.  CTA neck shows 2 mm saccular aneurysm of right ICA.  MRI brain shows multiple subacute infarcts of left MCA territory mainly watershed areas of frontal and parietal lobes concerning for embolic infarcts.   Hyperlipidemia    Hypertension    Vitamin D deficiency    Past Surgical History:  Procedure Laterality Date   CHOLECYSTECTOMY  11/12/1992   rectal fissure repair  11/12/2000   Right Frontal Sinus Surgery Right 02/03/2018   OCULOPLASTIC SURGERY: Procedures or frontal orbital mass removal: Stereotactic computer-assisted navigational  procedure-cranial/extradural; right eye socket exploration -orbitotomy with bone flap and lesion removal, right frontal sinus/transorbital exploration (frontal sinusotomy) [facility] Dr. EJoana Reamer(Opthalmology), Dr BAaron EdelmanD. Thorp (ENT)   TRANSTHORACIC ECHOCARDIOGRAM  10/13/2021   Normal LV size and function.  EF 60 to 65%.  Normal diastolic pressure.  Normal RV.  Normal valves   TUBAL LIGATION  11/12/1993   Family History  Problem Relation Age of Onset   Hypertension Mother    Heart disease Mother    Hypercholesterolemia Brother    Colon cancer Neg Hx    Breast cancer Neg Hx    Social History   Socioeconomic History   Marital status: Married    Spouse name: Not on file   Number of children: 1   Years of education: Not on file   Highest education level: Not on file  Occupational History   Not on file  Tobacco Use   Smoking status: Former    Packs/day: 0.50    Years: 40.00    Total pack years: 20.00    Types: Cigarettes    Quit date: 02/11/2020    Years since quitting: 2.4   Smokeless tobacco: Never  Vaping Use   Vaping Use: Never used  Substance and Sexual Activity   Alcohol use: No    Alcohol/week: 0.0 standard drinks of alcohol   Drug use: No   Sexual activity: Not on file  Other Topics Concern   Not on file  Social History Narrative  Patient ID: Carrie Wilson, female   DOB: 06-Jan-1956, 66 y.o.   MRN: [id number]   Subjective:    Patient ID: Carrie Wilson, female    DOB: 08/07/1956, 66 y.o.   MRN: [id number]   Patient here for a scheduled follow up.   Chief Complaint  Patient presents with   Diabetes   Hyperlipidemia   Hypertension   .   HPI Here to follow up regarding her blood pressure, blood sugar, cholesterol and recent CVA.  Previous CTA - 09/2021 - 48m saccular outpouching of right ICA.  [facility].  States she spoke to specialist and they recommended her to f/u in 3 years.  Denies headache or dizziness.  No chest pain or sob reported.  No abdominal pain.  Bowels moving. Overall feels good. Her daughter and granddaughter check her blood pressure - doing well.    Past Medical History:  Diagnosis Date   Carotid aneurysm, right ([facility] 09/15/2021    [facility] No evidence of dissection/aneurysm of the carotid or vertebral arteries.  No significant arterial stenosis..->  No ICH, acute infarct or mass.  Secular right maxillary sinus surgery.  Major arterial structures appear normal.  No definite stenoses.  2 mm saccular outpouching along C7 segment of right ICA.   GERD (gastroesophageal reflux disease)    neg H. pylori   Hx of transient ischemic attack (TIA) 08/01/2021   Recurrent episode September 15, 2021: Normal echo.  CTA neck shows 2 mm saccular aneurysm of right ICA.  MRI brain shows multiple subacute infarcts of left MCA territory mainly watershed areas of frontal and parietal lobes concerning for embolic infarcts.   Hyperlipidemia    Hypertension    Vitamin D deficiency    Past Surgical History:  Procedure Laterality Date   CHOLECYSTECTOMY  11/12/1992   rectal fissure repair  11/12/2000   Right Frontal Sinus Surgery Right 02/03/2018   OCULOPLASTIC SURGERY: Procedures or frontal orbital mass removal: Stereotactic computer-assisted navigational  procedure-cranial/extradural; right eye socket exploration -orbitotomy with bone flap and lesion removal, right frontal sinus/transorbital exploration (frontal sinusotomy) ([facility]: Dr. EJoana Reamer(Opthalmology), Dr BAaron EdelmanD. Thorp (ENT)   TRANSTHORACIC ECHOCARDIOGRAM  10/13/2021   Normal LV size and function.  EF 60 to 65%.  Normal diastolic pressure.  Normal RV.  Normal valves   TUBAL LIGATION  11/12/1993   Family History  Problem Relation Age of Onset   Hypertension Mother    Heart disease Mother    Hypercholesterolemia Brother    Colon cancer Neg Hx    Breast cancer Neg Hx    Social History   Socioeconomic History   Marital status: Married    Spouse name: Not on file   Number of children: 1   Years of education: Not on file   Highest education level: Not on file  Occupational History   Not on file  Tobacco Use   Smoking status: Former    Packs/day: 0.50    Years: 40.00    Total pack years: 20.00    Types: Cigarettes    Quit date: 02/11/2020    Years since quitting: 2.4   Smokeless tobacco: Never  Vaping Use   Vaping Use: Never used  Substance and Sexual Activity   Alcohol use: No    Alcohol/week: 0.0 standard drinks of alcohol   Drug use: No   Sexual activity: Not on file  Other Topics Concern   Not on file  Social History Narrative  Patient ID: Carrie Wilson, female   DOB: 06-Jan-1956, 66 y.o.   MRN: [id number]   Subjective:    Patient ID: Carrie Wilson, female    DOB: 08/07/1956, 66 y.o.   MRN: [id number]   Patient here for a scheduled follow up.   Chief Complaint  Patient presents with   Diabetes   Hyperlipidemia   Hypertension   .   HPI Here to follow up regarding her blood pressure, blood sugar, cholesterol and recent CVA.  Previous CTA - 09/2021 - 48m saccular outpouching of right ICA.  [facility].  States she spoke to specialist and they recommended her to f/u in 3 years.  Denies headache or dizziness.  No chest pain or sob reported.  No abdominal pain.  Bowels moving. Overall feels good. Her daughter and granddaughter check her blood pressure - doing well.    Past Medical History:  Diagnosis Date   Carotid aneurysm, right ([facility] 09/15/2021    [facility] No evidence of dissection/aneurysm of the carotid or vertebral arteries.  No significant arterial stenosis..->  No ICH, acute infarct or mass.  Secular right maxillary sinus surgery.  Major arterial structures appear normal.  No definite stenoses.  2 mm saccular outpouching along C7 segment of right ICA.   GERD (gastroesophageal reflux disease)    neg H. pylori   Hx of transient ischemic attack (TIA) 08/01/2021   Recurrent episode September 15, 2021: Normal echo.  CTA neck shows 2 mm saccular aneurysm of right ICA.  MRI brain shows multiple subacute infarcts of left MCA territory mainly watershed areas of frontal and parietal lobes concerning for embolic infarcts.   Hyperlipidemia    Hypertension    Vitamin D deficiency    Past Surgical History:  Procedure Laterality Date   CHOLECYSTECTOMY  11/12/1992   rectal fissure repair  11/12/2000   Right Frontal Sinus Surgery Right 02/03/2018   OCULOPLASTIC SURGERY: Procedures or frontal orbital mass removal: Stereotactic computer-assisted navigational  procedure-cranial/extradural; right eye socket exploration -orbitotomy with bone flap and lesion removal, right frontal sinus/transorbital exploration (frontal sinusotomy) ([facility]: Dr. EJoana Reamer(Opthalmology), Dr BAaron EdelmanD. Thorp (ENT)   TRANSTHORACIC ECHOCARDIOGRAM  10/13/2021   Normal LV size and function.  EF 60 to 65%.  Normal diastolic pressure.  Normal RV.  Normal valves   TUBAL LIGATION  11/12/1993   Family History  Problem Relation Age of Onset   Hypertension Mother    Heart disease Mother    Hypercholesterolemia Brother    Colon cancer Neg Hx    Breast cancer Neg Hx    Social History   Socioeconomic History   Marital status: Married    Spouse name: Not on file   Number of children: 1   Years of education: Not on file   Highest education level: Not on file  Occupational History   Not on file  Tobacco Use   Smoking status: Former    Packs/day: 0.50    Years: 40.00    Total pack years: 20.00    Types: Cigarettes    Quit date: 02/11/2020    Years since quitting: 2.4   Smokeless tobacco: Never  Vaping Use   Vaping Use: Never used  Substance and Sexual Activity   Alcohol use: No    Alcohol/week: 0.0 standard drinks of alcohol   Drug use: No   Sexual activity: Not on file  Other Topics Concern   Not on file  Social History Narrative

## 2022-07-12 ENCOUNTER — Other Ambulatory Visit: Payer: Self-pay | Admitting: Internal Medicine

## 2022-07-12 DIAGNOSIS — Z1231 Encounter for screening mammogram for malignant neoplasm of breast: Secondary | ICD-10-CM

## 2022-07-15 ENCOUNTER — Telehealth: Payer: Self-pay | Admitting: Internal Medicine

## 2022-07-15 ENCOUNTER — Encounter: Payer: Self-pay | Admitting: Internal Medicine

## 2022-07-15 NOTE — Assessment & Plan Note (Signed)
On pravastatin.  Low cholesterol diet and exercise.  Follow lipid panel and liver function tests.   

## 2022-07-15 NOTE — Telephone Encounter (Signed)
Need eye exam report. Please confirm if has gone and need report for our records.

## 2022-07-15 NOTE — Assessment & Plan Note (Signed)
History of TIA (less than two months apart).  MRI as outlind.  (likely embolic infarcts).  Concern for afib as an etiology.  Saw cardiology.  Discussed loop recorder.  She declines at this time.  Continue aspirin and plavix.

## 2022-07-15 NOTE — Assessment & Plan Note (Signed)
Low carb diet and exercise.  Previously on prednisone.  Off now.  Sugars better.  Follow met b and a1c.  ?

## 2022-07-15 NOTE — Assessment & Plan Note (Signed)
Follow cbc.  Needs to quit smoking.

## 2022-07-15 NOTE — Assessment & Plan Note (Signed)
Continues on aspirin and plavix.  Blood pressure controlled.  

## 2022-07-15 NOTE — Assessment & Plan Note (Signed)
On pravastatin.   

## 2022-07-15 NOTE — Assessment & Plan Note (Signed)
Needs to quit smoking.  Follow

## 2022-07-15 NOTE — Assessment & Plan Note (Signed)
Breathing stable.  No cough.  No sob.  Follow.  

## 2022-07-17 NOTE — Telephone Encounter (Signed)
Faxed medical release. confirmation given.

## 2022-07-17 NOTE — Telephone Encounter (Signed)
I called the patient and she has had her eye exam in January, I filled out the medical release and faxed to the [facility] office for the report.  Wadsworth Skolnick,cma

## 2022-07-25 ENCOUNTER — Encounter: Payer: Self-pay | Admitting: Internal Medicine

## 2022-08-07 ENCOUNTER — Ambulatory Visit
Admission: RE | Admit: 2022-08-07 | Discharge: 2022-08-07 | Disposition: A | Discharge status: Home / Self Care | Payer: Medicare HMO | Source: Ambulatory Visit | Attending: Internal Medicine | Admitting: Internal Medicine

## 2022-08-07 DIAGNOSIS — Z1231 Encounter for screening mammogram for malignant neoplasm of breast: Secondary | ICD-10-CM | POA: Insufficient documentation

## 2022-10-23 DIAGNOSIS — H2513 Age-related nuclear cataract, bilateral: Secondary | ICD-10-CM | POA: Diagnosis not present

## 2022-11-02 ENCOUNTER — Encounter: Payer: Self-pay | Admitting: Internal Medicine

## 2022-11-02 ENCOUNTER — Ambulatory Visit (INDEPENDENT_AMBULATORY_CARE_PROVIDER_SITE_OTHER): Payer: Medicare HMO | Admitting: Internal Medicine

## 2022-11-02 VITALS — BP 118/76 | HR 88 | Temp 97.6°F | Resp 16 | Ht 68.0 in | Wt 177.2 lb

## 2022-11-02 DIAGNOSIS — I700 Atherosclerosis of aorta: Secondary | ICD-10-CM

## 2022-11-02 DIAGNOSIS — I10 Essential (primary) hypertension: Secondary | ICD-10-CM | POA: Diagnosis not present

## 2022-11-02 DIAGNOSIS — K219 Gastro-esophageal reflux disease without esophagitis: Secondary | ICD-10-CM

## 2022-11-02 DIAGNOSIS — E1169 Type 2 diabetes mellitus with other specified complication: Secondary | ICD-10-CM | POA: Diagnosis not present

## 2022-11-02 DIAGNOSIS — Z0000 Encounter for general adult medical examination without abnormal findings: Secondary | ICD-10-CM | POA: Diagnosis not present

## 2022-11-02 DIAGNOSIS — I671 Cerebral aneurysm, nonruptured: Secondary | ICD-10-CM | POA: Diagnosis not present

## 2022-11-02 DIAGNOSIS — Z8673 Personal history of transient ischemic attack (TIA), and cerebral infarction without residual deficits: Secondary | ICD-10-CM | POA: Diagnosis not present

## 2022-11-02 DIAGNOSIS — J439 Emphysema, unspecified: Secondary | ICD-10-CM

## 2022-11-02 DIAGNOSIS — E785 Hyperlipidemia, unspecified: Secondary | ICD-10-CM | POA: Diagnosis not present

## 2022-11-02 DIAGNOSIS — E1159 Type 2 diabetes mellitus with other circulatory complications: Secondary | ICD-10-CM | POA: Diagnosis not present

## 2022-11-02 DIAGNOSIS — D582 Other hemoglobinopathies: Secondary | ICD-10-CM | POA: Diagnosis not present

## 2022-11-02 LAB — LIPID PANEL
Cholesterol: 142 mg/dL (ref 0–200)
HDL: 30.9 mg/dL — ABNORMAL LOW (ref >39.00)
LDL Cholesterol: 88 mg/dL (ref 0–99)
NonHDL: 111.51
Total CHOL/HDL Ratio: 5
Triglycerides: 118 mg/dL (ref 0.0–149.0)
VLDL: 23.6 mg/dL (ref 0.0–40.0)

## 2022-11-02 LAB — CBC WITH DIFFERENTIAL/PLATELET
Basophils Absolute: 0 10*3/uL (ref 0.0–0.1)
Basophils Relative: 0.4 % (ref 0.0–3.0)
Eosinophils Absolute: 0 10*3/uL (ref 0.0–0.7)
Eosinophils Relative: 0.8 % (ref 0.0–5.0)
HCT: 43 % (ref 36.0–46.0)
Hemoglobin: 14.5 g/dL (ref 12.0–15.0)
Lymphocytes Relative: 32.7 % (ref 12.0–46.0)
Lymphs Abs: 1.8 10*3/uL (ref 0.7–4.0)
MCHC: 33.8 g/dL (ref 30.0–36.0)
MCV: 91.6 fl (ref 78.0–100.0)
Monocytes Absolute: 0.5 10*3/uL (ref 0.1–1.0)
Monocytes Relative: 8.7 % (ref 3.0–12.0)
Neutro Abs: 3.2 10*3/uL (ref 1.4–7.7)
Neutrophils Relative %: 57.4 % (ref 43.0–77.0)
Platelets: 187 10*3/uL (ref 150.0–400.0)
RBC: 4.69 Mil/uL (ref 3.87–5.11)
RDW: 12.7 % (ref 11.5–15.5)
WBC: 5.6 10*3/uL (ref 4.0–10.5)

## 2022-11-02 LAB — HEPATIC FUNCTION PANEL
ALT: 13 U/L (ref 0–35)
AST: 14 U/L (ref 0–37)
Albumin: 4 g/dL (ref 3.5–5.2)
Alkaline Phosphatase: 77 U/L (ref 39–117)
Bilirubin, Direct: 0.1 mg/dL (ref 0.0–0.3)
Total Bilirubin: 0.7 mg/dL (ref 0.2–1.2)
Total Protein: 6.4 g/dL (ref 6.0–8.3)

## 2022-11-02 LAB — HEMOGLOBIN A1C: Hgb A1c MFr Bld: 6.6 % — ABNORMAL HIGH (ref 4.6–6.5)

## 2022-11-02 LAB — BASIC METABOLIC PANEL
BUN: 9 mg/dL (ref 6–23)
CO2: 30 mEq/L (ref 19–32)
Calcium: 8.8 mg/dL (ref 8.4–10.5)
Chloride: 103 mEq/L (ref 96–112)
Creatinine, Ser: 0.92 mg/dL (ref 0.40–1.20)
GFR: 64.2 mL/min (ref >60.00)
Glucose, Bld: 98 mg/dL (ref 70–99)
Potassium: 4 mEq/L (ref 3.5–5.1)
Sodium: 141 mEq/L (ref 135–145)

## 2022-11-02 LAB — MICROALBUMIN / CREATININE URINE RATIO
Creatinine,U: 371.8 mg/dL
Microalb Creat Ratio: 0.8 mg/g (ref 0.0–30.0)
Microalb, Ur: 2.8 mg/dL — ABNORMAL HIGH (ref 0.0–1.9)

## 2022-11-02 MED ORDER — PRAVASTATIN SODIUM 10 MG PO TABS: 10.0000 mg | ORAL_TABLET | ORAL | 2 refills | Status: DC

## 2022-11-02 NOTE — Progress Notes (Unsigned)
Subjective:    Patient ID: Carrie Wilson, female    DOB: [birth year], 66 y.o.   MRN: [id number]  Patient here for  Chief Complaint  Patient presents with   Annual Exam    cpe    HPI Here for physical exam. She reports she is doing relatively well.  Stays active.  No chest pain or sob reported.  Reports one week ago, episode of indigestion.  States felt acid - stomach to throat.  Took nexium.  Hoarseness.  No fever.  Cough is better.  On questioning, feels better.  Has been off nexium and has done well off the medication.  No acid reflux since.  No abdominal pain.  Bowels moving.  Overall she feels she is doing well.     Past Medical History:  Diagnosis Date   Carotid aneurysm, right (HCC) 09/15/2021    CTA Neck-brain 09/17/2021 [facility] H): No evidence of dissection/aneurysm of the carotid or vertebral arteries.  No significant arterial stenosis..->  No ICH, acute infarct or mass.  Secular right maxillary sinus surgery.  Major arterial structures appear normal.  No definite stenoses.  2 mm saccular outpouching along C7 segment of right ICA.   GERD (gastroesophageal reflux disease)    neg H. pylori   Hx of transient ischemic attack (TIA) 08/01/2021   Recurrent episode September 15, 2021: Normal echo.  CTA neck shows 2 mm saccular aneurysm of right ICA.  MRI brain shows multiple subacute infarcts of left MCA territory mainly watershed areas of frontal and parietal lobes concerning for embolic infarcts.   Hyperlipidemia    Hypertension    Vitamin D deficiency    Past Surgical History:  Procedure Laterality Date   CHOLECYSTECTOMY  11/12/1992   rectal fissure repair  11/12/2000   Right Frontal Sinus Surgery Right 02/03/2018   OCULOPLASTIC SURGERY: Procedures or frontal orbital mass removal: Stereotactic computer-assisted navigational procedure-cranial/extradural; right eye socket exploration -orbitotomy with bone flap and lesion removal, right frontal sinus/transorbital exploration  (frontal sinusotomy) [facility] Main OR: Dr. Benny Lennert (Opthalmology), Dr Arlys John D. Thorp (ENT)   TRANSTHORACIC ECHOCARDIOGRAM  10/13/2021   Normal LV size and function.  EF 60 to 65%.  Normal diastolic pressure.  Normal RV.  Normal valves   TUBAL LIGATION  11/12/1993   Family History  Problem Relation Age of Onset   Hypertension Mother    Heart disease Mother    Hypercholesterolemia Brother    Colon cancer Neg Hx    Breast cancer Neg Hx    Social History   Socioeconomic History   Marital status: Married    Spouse name: Not on file   Number of children: 1   Years of education: Not on file   Highest education level: Not on file  Occupational History   Not on file  Tobacco Use   Smoking status: Former    Packs/day: 0.50    Years: 40.00    Total pack years: 20.00    Types: Cigarettes    Quit date: 02/11/2020    Years since quitting: 2.7   Smokeless tobacco: Never  Vaping Use   Vaping Use: Never used  Substance and Sexual Activity   Alcohol use: No    Alcohol/week: 0.0 standard drinks of alcohol   Drug use: No   Sexual activity: Not on file  Other Topics Concern   Not on file  Social History Narrative   She is very active, always on the go.  Not necessarily routine exercise, but always doing things.  Not limited by any myalgias or arthralgias or dyspnea.   Social Determinants of Health   Financial Resource Strain: Low Risk  (12/08/2021)   Overall Financial Resource Strain (CARDIA)    Difficulty of Paying Living Expenses: Not hard at all  Food Insecurity: No Food Insecurity (12/08/2021)   Hunger Vital Sign    Worried About Running Out of Food in the Last Year: Never true    Ran Out of Food in the Last Year: Never true  Transportation Needs: No Transportation Needs (12/08/2021)   PRAPARE - Administrator, Civil Service (Medical): No    Lack of Transportation (Non-Medical): No  Physical Activity: Sufficiently Active (12/08/2021)   Exercise Vital Sign    Days of  Exercise per Week: 5 days    Minutes of Exercise per Session: 30 min  Stress: No Stress Concern Present (12/08/2021)   [employer] of Occupational Health - Occupational Stress Questionnaire    Feeling of Stress : Not at all  Social Connections: Unknown (12/08/2021)   Social Connection and Isolation Panel [NHANES]    Frequency of Communication with Friends and Family: More than three times a week    Frequency of Social Gatherings with Friends and Family: More than three times a week    Attends Religious Services: Not on Marketing executive or Organizations: Not on file    Attends Banker Meetings: Not on file    Marital Status: Not on file     Review of Systems  Constitutional:  Negative for appetite change and unexpected weight change.  HENT:  Negative for congestion, sinus pressure and sore throat.   Eyes:  Negative for pain and visual disturbance.  Respiratory:  Negative for cough, chest tightness and shortness of breath.   Cardiovascular:  Negative for chest pain, palpitations and leg swelling.  Gastrointestinal:  Negative for abdominal pain, diarrhea, nausea and vomiting.       Episode of reflux as outlined.   Genitourinary:  Negative for difficulty urinating and dysuria.  Musculoskeletal:  Negative for joint swelling and myalgias.  Skin:  Negative for color change and rash.  Neurological:  Negative for dizziness and headaches.  Hematological:  Negative for adenopathy. Does not bruise/bleed easily.  Psychiatric/Behavioral:  Negative for agitation and dysphoric mood.        Objective:     BP 118/76 (BP Location: Left Arm, Patient Position: Sitting, Cuff Size: Small)   Pulse 88   Temp 97.6 F (36.4 C) (Temporal)   Resp 16   Ht 5\' 8"  (1.727 m)   Wt 177 lb 3.2 oz (80.4 kg)   LMP 12/08/1997   SpO2 96%   BMI 26.94 kg/m  Wt Readings from Last 3 Encounters:  11/02/22 177 lb 3.2 oz (80.4 kg)  07/03/22 179 lb 3.2 oz (81.3 kg)  03/01/22 174 lb  9.6 oz (79.2 kg)    Physical Exam Vitals reviewed.  Constitutional:      General: She is not in acute distress.    Appearance: Normal appearance. She is well-developed.  HENT:     Head: Normocephalic and atraumatic.     Right Ear: External ear normal.     Left Ear: External ear normal.  Eyes:     General: No scleral icterus.       Right eye: No discharge.        Left eye: No discharge.     Conjunctiva/sclera: Conjunctivae normal.  Neck:  Thyroid: No thyromegaly.  Cardiovascular:     Rate and Rhythm: Normal rate and regular rhythm.  Pulmonary:     Effort: No tachypnea, accessory muscle usage or respiratory distress.     Breath sounds: Normal breath sounds. No decreased breath sounds or wheezing.  Chest:  Breasts:    Right: No inverted nipple, mass, nipple discharge or tenderness (no axillary adenopathy).     Left: No inverted nipple, mass, nipple discharge or tenderness (no axilarry adenopathy).  Abdominal:     General: Bowel sounds are normal.     Palpations: Abdomen is soft.     Tenderness: There is no abdominal tenderness.  Musculoskeletal:        General: No swelling or tenderness.     Cervical back: Neck supple.  Lymphadenopathy:     Cervical: No cervical adenopathy.  Skin:    Findings: No erythema or rash.  Neurological:     Mental Status: She is alert and oriented to person, place, and time.  Psychiatric:        Mood and Affect: Mood normal.        Behavior: Behavior normal.      Outpatient Encounter Medications as of 11/02/2022  Medication Sig   aspirin 81 MG chewable tablet Chew 81 mg by mouth daily.   cholecalciferol (VITAMIN D) 1000 units tablet Take 1,000 Units by mouth daily.   clopidogrel (PLAVIX) 75 MG tablet Take 1 tablet (75 mg total) by mouth daily.   [DISCONTINUED] pravastatin (PRAVACHOL) 10 MG tablet Take 1 tablet (10 mg total) by mouth every other day.   pravastatin (PRAVACHOL) 10 MG tablet Take 1 tablet (10 mg total) by mouth every other  day.   No facility-administered encounter medications on file as of 11/02/2022.     Lab Results  Component Value Date   WBC 5.6 11/02/2022   HGB 14.5 11/02/2022   HCT 43.0 11/02/2022   PLT 187.0 11/02/2022   GLUCOSE 98 11/02/2022   CHOL 142 11/02/2022   TRIG 118.0 11/02/2022   HDL 30.90 (L) 11/02/2022   LDLDIRECT 110.0 06/18/2018   LDLCALC 88 11/02/2022   ALT 13 11/02/2022   AST 14 11/02/2022   NA 141 11/02/2022   K 4.0 11/02/2022   CL 103 11/02/2022   CREATININE 0.92 11/02/2022   BUN 9 11/02/2022   CO2 30 11/02/2022   TSH 1.50 11/02/2022   HGBA1C 6.6 (H) 11/02/2022   MICROALBUR 2.8 (H) 11/02/2022    MM 3D SCREEN BREAST BILATERAL  Result Date: 08/08/2022 CLINICAL DATA:  Screening. EXAM: DIGITAL SCREENING BILATERAL MAMMOGRAM WITH TOMOSYNTHESIS AND CAD TECHNIQUE: Bilateral screening digital craniocaudal and mediolateral oblique mammograms were obtained. Bilateral screening digital breast tomosynthesis was performed. The images were evaluated with computer-aided detection. COMPARISON:  Previous exam(s). ACR Breast Density Category b: There are scattered areas of fibroglandular density. FINDINGS: There are no findings suspicious for malignancy. IMPRESSION: No mammographic evidence of malignancy. A result letter of this screening mammogram will be mailed directly to the patient. RECOMMENDATION: Screening mammogram in one year. (Code:SM-B-01Y) BI-RADS CATEGORY  1: Negative. Electronically Signed   By: Edwin Cap M.D.   On: 08/08/2022 10:00       Assessment & Plan:  Essential hypertension Assessment & Plan: Blood pressure has been doing well on no medication.  Follow.    Orders: -     Basic metabolic panel -     CBC with Differential/Platelet  Controlled type 2 diabetes mellitus with other circulatory complication, without long-term current use of  insulin (HCC) Assessment & Plan: Low carb diet and exercise.  Previously on prednisone.  Off now.  Sugars better.  Follow met  b and a1c.   Orders: -     Hemoglobin A1c -     Microalbumin / creatinine urine ratio  Hyperlipidemia associated with type 2 diabetes mellitus (HCC) Assessment & Plan: On pravastatin.  Low cholesterol diet and exercise.  Follow lipidpanel and liver function tests.   Orders: -     Hepatic function panel -     Lipid panel -     TSH  Health care maintenance Assessment & Plan: Physical today 11/02/22.  PAP 08/19/19 - negative with negative HPV.  Mammogram 08/07/22 - Birads I.  Colonoscopy 01/2021 - internal hemorrhoids. Otherwise normal.     Aortic atherosclerosis (HCC) Assessment & Plan: On pravastatin.    Cerebral aneurysm without rupture Assessment & Plan: Continues on aspirin and plavix.  Blood pressure controlled.    Elevated hemoglobin (HCC) Assessment & Plan: Follow cbc.  Have discussed quitting smoking.    Pulmonary emphysema, unspecified emphysema type (HCC) Assessment & Plan: Breathing stable.  No cough.  No sob.  Follow.    Gastroesophageal reflux disease, unspecified whether esophagitis present Assessment & Plan: Has been on no medication.  Episode as outlined.  Resolved.  No further episodes.  Wants to monitor.  Notify if recurrence.     History of TIA (transient ischemic attack) Assessment & Plan: History of TIA (less than two months apart).  MRI as outlined.  (likely embolic infarcts).  Concern for afib as an etiology.  Saw cardiology.  Discussed loop recorder.  She declined.  Continue aspirin and plavix.     Other orders -     Pravastatin Sodium; Take 1 tablet (10 mg total) by mouth every other day.  Dispense: 45 tablet; Refill: 2     Dale Lakeland, MD

## 2022-11-02 NOTE — Assessment & Plan Note (Signed)
Physical today 11/02/22.  PAP 08/19/19 - negative with negative HPV.  Mammogram 08/07/22 - Birads I.  Colonoscopy 01/2021 - internal hemorrhoids. Otherwise normal.

## 2022-11-03 LAB — TSH: TSH: 1.5 u[IU]/mL (ref 0.35–5.50)

## 2022-11-05 ENCOUNTER — Encounter: Payer: Self-pay | Admitting: Internal Medicine

## 2022-11-05 NOTE — Assessment & Plan Note (Signed)
Blood pressure has been doing well on no medication.  Follow.

## 2022-11-05 NOTE — Assessment & Plan Note (Signed)
Breathing stable.  No cough.  No sob.  Follow.

## 2022-11-05 NOTE — Assessment & Plan Note (Signed)
On pravastatin.   

## 2022-11-05 NOTE — Assessment & Plan Note (Signed)
History of TIA (less than two months apart).  MRI as outlined.  (likely embolic infarcts).  Concern for afib as an etiology.  Saw cardiology.  Discussed loop recorder.  She declined.  Continue aspirin and plavix.

## 2022-11-05 NOTE — Assessment & Plan Note (Signed)
Low carb diet and exercise.  Previously on prednisone.  Off now.  Sugars better.  Follow met b and a1c.  ?

## 2022-11-05 NOTE — Assessment & Plan Note (Signed)
On pravastatin.  Low cholesterol diet and exercise.  Follow lipid panel and liver function tests.   

## 2022-11-05 NOTE — Assessment & Plan Note (Signed)
Continues on aspirin and plavix.  Blood pressure controlled.

## 2022-11-05 NOTE — Assessment & Plan Note (Signed)
Follow cbc.  Have discussed quitting smoking.

## 2022-11-05 NOTE — Assessment & Plan Note (Signed)
Has been on no medication.  Episode as outlined.  Resolved.  No further episodes.  Wants to monitor.  Notify if recurrence.

## 2022-11-13 DIAGNOSIS — H2511 Age-related nuclear cataract, right eye: Secondary | ICD-10-CM | POA: Diagnosis not present

## 2022-11-13 DIAGNOSIS — H2512 Age-related nuclear cataract, left eye: Secondary | ICD-10-CM | POA: Diagnosis not present

## 2022-11-26 ENCOUNTER — Encounter: Payer: Self-pay | Admitting: Ophthalmology

## 2022-11-28 ENCOUNTER — Telehealth: Payer: Self-pay | Admitting: Internal Medicine

## 2022-11-28 NOTE — Telephone Encounter (Signed)
Left message for patient to call back and schedule Medicare Annual Wellness Visit (AWV) in office.   If not able to come in office, please offer to do virtually or by telephone.  Left office number and my jabber [phone].  Last AWV:12/08/2021   Please schedule at anytime with Nurse Health Advisor.

## 2022-12-03 NOTE — Discharge Instructions (Signed)
   Cataract Surgery, Care After ? ?This sheet gives you information about how to care for yourself after your surgery.  Your ophthalmologist may also give you more specific instructions.  If you have problems or questions, contact your doctor at [facility], [phone]. ? ?What can I expect after the surgery? ?It is common to have: ?Itching ?Foreign body sensation (feels like a grain of sand in the eye) ?Watery discharge (excess tearing) ?Sensitivity to light and touch ?Bruising in or around the eye ?Mild blurred vision ? ?Follow these instructions at home: ?Do not touch or rub your eyes. ?You may be told to wear a protective shield or sunglasses to protect your eyes. ?Do not put a contact lens in the operative eye unless your doctor approves. ?Keep the lids and face clean and dry. ?Do not allow water to hit you directly in the face while showering. ?Keep soap and shampoo out of your eyes. ?Do not use eye makeup for 1 week. ? ?Check your eye every day for signs of infection.  Watch for: ?Redness, swelling, or pain. ?Fluid, blood or pus. ?Worsening vision. ?Worsening sensitivity to light or touch. ? ?Activity: ?During the first day, avoid bending over and reading.  You may resume reading and bending the next day. ?Do not drive or use heavy machinery for at least 24 hours. ?Avoid strenuous activities for 1 week.  Activities such as walking, treadmill, exercise bike, and climbing stairs are okay. ?Do not lift heavy (>20 pound) objects for 1 week. ?Do not do yardwork, gardening, or dirty housework (mopping, cleaning bathrooms, vacuuming, etc.) for 1 week. ?Do not swim or use a hot tub for 2 weeks. ?Ask your doctor when you can return to work. ? ?General Instructions: ?Take or apply prescription and over-the-counter medicines as directed by your doctor, including eyedrops and ointments. ?Resume medications discontinued prior to surgery, unless told otherwise by your doctor. ?Keep all follow up appointments as  scheduled. ? ?Contact a health care provider if: ?You have increased bruising around your eye. ?You have pain that is not helped with medication. ?You have a fever. ?You have fluid, pus, or blood coming from your eye or incision. ?Your sensitivity to light gets worse. ?You have spots (floaters) of flashing lights in your vision. ?You have nausea or vomiting. ? ?Go to the nearest emergency room or call 911 if: ?You have sudden loss of vision. ?You have severe, worsening eye pain. ? ?

## 2022-12-05 ENCOUNTER — Ambulatory Visit: Payer: Medicare HMO | Admitting: Anesthesiology

## 2022-12-05 ENCOUNTER — Encounter
Admission: RE | Disposition: A | Discharge status: Home / Self Care | Payer: Self-pay | Source: Ambulatory Visit | Attending: Ophthalmology

## 2022-12-05 ENCOUNTER — Encounter: Payer: Self-pay | Admitting: Ophthalmology

## 2022-12-05 ENCOUNTER — Ambulatory Visit
Admission: RE | Admit: 2022-12-05 | Discharge: 2022-12-05 | Disposition: A | Discharge status: Home / Self Care | Payer: Medicare HMO | Source: Ambulatory Visit | Attending: Ophthalmology | Admitting: Ophthalmology

## 2022-12-05 ENCOUNTER — Other Ambulatory Visit: Payer: Self-pay

## 2022-12-05 DIAGNOSIS — Z87891 Personal history of nicotine dependence: Secondary | ICD-10-CM | POA: Insufficient documentation

## 2022-12-05 DIAGNOSIS — H2511 Age-related nuclear cataract, right eye: Secondary | ICD-10-CM | POA: Diagnosis not present

## 2022-12-05 DIAGNOSIS — I10 Essential (primary) hypertension: Secondary | ICD-10-CM | POA: Insufficient documentation

## 2022-12-05 DIAGNOSIS — Z8673 Personal history of transient ischemic attack (TIA), and cerebral infarction without residual deficits: Secondary | ICD-10-CM | POA: Insufficient documentation

## 2022-12-05 DIAGNOSIS — H5703 Miosis: Secondary | ICD-10-CM | POA: Insufficient documentation

## 2022-12-05 DIAGNOSIS — J449 Chronic obstructive pulmonary disease, unspecified: Secondary | ICD-10-CM | POA: Diagnosis not present

## 2022-12-05 HISTORY — DX: Cerebral infarction, unspecified: I63.9

## 2022-12-05 HISTORY — PX: CATARACT EXTRACTION W/PHACO: SHX586

## 2022-12-05 SURGERY — PHACOEMULSIFICATION, CATARACT, WITH IOL INSERTION
Anesthesia: Topical | Site: Eye | Laterality: Right

## 2022-12-05 MED ORDER — SIGHTPATH DOSE#1 BSS IO SOLN
INTRAOCULAR | Status: DC | PRN
  Administered 2022-12-05: 67 mL via OPHTHALMIC

## 2022-12-05 MED ORDER — TETRACAINE HCL 0.5 % OP SOLN
1.0000 [drp] | OPHTHALMIC | Status: DC | PRN
  Administered 2022-12-05 (×3): 1 [drp] via OPHTHALMIC

## 2022-12-05 MED ORDER — SIGHTPATH DOSE#1 BSS IO SOLN
INTRAOCULAR | Status: DC | PRN
  Administered 2022-12-05: 1 mL via INTRAMUSCULAR

## 2022-12-05 MED ORDER — MIDAZOLAM HCL 2 MG/2ML IJ SOLN
INTRAMUSCULAR | Status: DC | PRN
  Administered 2022-12-05: 1 mg via INTRAVENOUS

## 2022-12-05 MED ORDER — SIGHTPATH DOSE#1 NA HYALUR & NA CHOND-NA HYALUR IO KIT
PACK | INTRAOCULAR | Status: DC | PRN
  Administered 2022-12-05: 1 via OPHTHALMIC

## 2022-12-05 MED ORDER — MOXIFLOXACIN HCL 0.5 % OP SOLN
OPHTHALMIC | Status: DC | PRN
  Administered 2022-12-05: .2 mL via OPHTHALMIC

## 2022-12-05 MED ORDER — FENTANYL CITRATE (PF) 100 MCG/2ML IJ SOLN
INTRAMUSCULAR | Status: DC | PRN
  Administered 2022-12-05 (×2): 50 ug via INTRAVENOUS

## 2022-12-05 MED ORDER — SIGHTPATH DOSE#1 BSS IO SOLN
INTRAOCULAR | Status: DC | PRN
  Administered 2022-12-05: 15 mL

## 2022-12-05 MED ORDER — BRIMONIDINE TARTRATE-TIMOLOL 0.2-0.5 % OP SOLN
OPHTHALMIC | Status: DC | PRN
  Administered 2022-12-05: 1 [drp] via OPHTHALMIC

## 2022-12-05 MED ORDER — ARMC OPHTHALMIC DILATING DROPS
1.0000 | OPHTHALMIC | Status: DC | PRN
  Administered 2022-12-05 (×3): 1 via OPHTHALMIC

## 2022-12-05 SURGICAL SUPPLY — 12 items
CATARACT SUITE SIGHTPATH (MISCELLANEOUS) ×1 IMPLANT
FEE CATARACT SUITE SIGHTPATH (MISCELLANEOUS) ×1 IMPLANT
GLOVE SRG 8 PF TXTR STRL LF DI (GLOVE) ×1 IMPLANT
GLOVE SURG ENC TEXT LTX SZ7.5 (GLOVE) ×1 IMPLANT
GLOVE SURG UNDER POLY LF SZ8 (GLOVE) ×1
LENS IOL TECNIS EYHANCE 22.0 (Intraocular Lens) IMPLANT
NDL FILTER BLUNT 18X1 1/2 (NEEDLE) ×1 IMPLANT
NEEDLE FILTER BLUNT 18X1 1/2 (NEEDLE) ×1 IMPLANT
RING MALYGIN 7.0 (MISCELLANEOUS) IMPLANT
SYR 3ML LL SCALE MARK (SYRINGE) ×1 IMPLANT
WATER STERILE IRR 250ML POUR (IV SOLUTION) ×1 IMPLANT
WICK EYE OCUCEL (MISCELLANEOUS) IMPLANT

## 2022-12-05 NOTE — Transfer of Care (Signed)
Immediate Anesthesia Transfer of Care Note  Patient: Carrie Wilson  Procedure(s) Performed: CATARACT EXTRACTION PHACO AND INTRAOCULAR LENS PLACEMENT (IOC) RIGHT MALYUGIN  11.10  01:02.3 (Right: Eye)  Patient Location: PACU  Anesthesia Type: No value filed.  Level of Consciousness: awake, alert  and patient cooperative  Airway and Oxygen Therapy: Patient Spontanous Breathing and Patient connected to supplemental oxygen  Post-op Assessment: Post-op Vital signs reviewed, Patient's Cardiovascular Status Stable, Respiratory Function Stable, Patent Airway and No signs of Nausea or vomiting  Post-op Vital Signs: Reviewed and stable  Complications: No notable events documented.

## 2022-12-05 NOTE — Anesthesia Preprocedure Evaluation (Signed)
Anesthesia Evaluation  Patient identified by MRN, date of birth, ID band Patient awake    Reviewed: Allergy & Precautions, H&P , NPO status , Patient's Chart, lab work & pertinent test results, reviewed documented beta blocker date and time   Airway Mallampati: II  TM Distance: >3 FB Neck ROM: full    Dental no notable dental hx. (+) Teeth Intact   Pulmonary COPD, former smoker   Pulmonary exam normal breath sounds clear to auscultation       Cardiovascular Exercise Tolerance: Good hypertension, On Medications negative cardio ROS  Rhythm:regular Rate:Normal     Neuro/Psych CVA  negative psych ROS   GI/Hepatic negative GI ROS, Neg liver ROS,,,  Endo/Other  negative endocrine ROSdiabetes, Well Controlled    Renal/GU      Musculoskeletal   Abdominal   Peds  Hematology negative hematology ROS (+)   Anesthesia Other Findings   Reproductive/Obstetrics negative OB ROS                             Anesthesia Physical Anesthesia Plan  ASA: 3  Anesthesia Plan: MAC   Post-op Pain Management:    Induction:   PONV Risk Score and Plan:   Airway Management Planned:   Additional Equipment:   Intra-op Plan:   Post-operative Plan:   Informed Consent: I have reviewed the patients History and Physical, chart, labs and discussed the procedure including the risks, benefits and alternatives for the proposed anesthesia with the patient or authorized representative who has indicated his/her understanding and acceptance.       Plan Discussed with: CRNA  Anesthesia Plan Comments:        Anesthesia Quick Evaluation

## 2022-12-05 NOTE — H&P (Signed)
[facility]   Primary Care Physician:  Einar Pheasant, MD Ophthalmologist: Dr. Leandrew Koyanagi  Pre-Procedure History & Physical: HPI:  Carrie Wilson is a 67 y.o. female here for ophthalmic surgery.   Past Medical History:  Diagnosis Date   Carotid aneurysm, right ([place]) 09/15/2021    CTA Neck-brain 09/17/2021 [facility] H): No evidence of dissection/aneurysm of the carotid or vertebral arteries.  No significant arterial stenosis..->  No ICH, acute infarct or mass.  Secular right maxillary sinus surgery.  Major arterial structures appear normal.  No definite stenoses.  2 mm saccular outpouching along C7 segment of right ICA.   Hx of transient ischemic attack (TIA) 08/01/2021   Recurrent episode September 15, 2021: Normal echo.  CTA neck shows 2 mm saccular aneurysm of right ICA.  MRI brain shows multiple subacute infarcts of left MCA territory mainly watershed areas of frontal and parietal lobes concerning for embolic infarcts.   Hyperlipidemia    Stroke [facility])    Vitamin D deficiency     Past Surgical History:  Procedure Laterality Date   CHOLECYSTECTOMY  11/12/1992   rectal fissure repair  11/12/2000   Right Frontal Sinus Surgery Right 02/03/2018   OCULOPLASTIC SURGERY: Procedures or frontal orbital mass removal: Stereotactic computer-assisted navigational procedure-cranial/extradural; right eye socket exploration -orbitotomy with bone flap and lesion removal, right frontal sinus/transorbital exploration (frontal sinusotomy) [facility] Main OR: Dr. [name] (Opthalmology), [name] (ENT)   TRANSTHORACIC ECHOCARDIOGRAM  10/13/2021   Normal LV size and function.  EF 60 to 65%.  Normal diastolic pressure.  Normal RV.  Normal valves   TUBAL LIGATION  11/12/1993    Prior to Admission medications   Medication Sig Start Date End Date Taking? Authorizing Provider  aspirin 81 MG chewable tablet Chew 81 mg by mouth daily. 09/17/21  Yes [provider]   cholecalciferol (VITAMIN D) 1000 units tablet Take 1,000 Units by mouth daily.   Yes [provider]  clopidogrel (PLAVIX) 75 MG tablet Take 1 tablet (75 mg total) by mouth daily. 07/03/22  Yes Einar Pheasant, MD  pravastatin (PRAVACHOL) 10 MG tablet Take 1 tablet (10 mg total) by mouth every other day. 11/02/22  Yes Einar Pheasant, MD    Allergies as of 10/24/2022 - Review Complete 07/15/2022  Allergen Reaction Noted   Codeine sulfate Nausea And Vomiting 12/05/2012   Penicillins Hives 12/05/2012    Family History  Problem Relation Age of Onset   Hypertension Mother    Heart disease Mother    Hypercholesterolemia Brother    Colon cancer Neg Hx    Breast cancer Neg Hx     Social History   Socioeconomic History   Marital status: Married    Spouse name: Not on file   Number of children: 1   Years of education: Not on file   Highest education level: Not on file  Occupational History   Not on file  Tobacco Use   Smoking status: Former    Packs/day: 0.50    Years: 40.00    Total pack years: 20.00    Types: Cigarettes    Quit date: 02/11/2020    Years since quitting: 2.8   Smokeless tobacco: Never  Vaping Use   Vaping Use: Never used  Substance and Sexual Activity   Alcohol use: No    Alcohol/week: 0.0 standard drinks of alcohol   Drug use: No   Sexual activity: Not on file  Other Topics Concern   Not on file  Social  History Narrative   She is very active, always on the go.  Not necessarily routine exercise, but always doing things.  Not limited by any myalgias or arthralgias or dyspnea.   Social Determinants of Health   Financial Resource Strain: Low Risk  (12/08/2021)   Overall Financial Resource Strain (CARDIA)    Difficulty of Paying Living Expenses: Not hard at all  Food Insecurity: No Food Insecurity (12/08/2021)   Hunger Vital Sign    Worried About Running Out of Food in the Last Year: Never true    Ran Out of Food in the Last Year: Never true   Transportation Needs: No Transportation Needs (12/08/2021)   PRAPARE - Hydrologist (Medical): No    Lack of Transportation (Non-Medical): No  Physical Activity: Sufficiently Active (12/08/2021)   Exercise Vital Sign    Days of Exercise per Week: 5 days    Minutes of Exercise per Session: 30 min  Stress: No Stress Concern Present (12/08/2021)   [place]    Feeling of Stress : Not at all  Social Connections: Unknown (12/08/2021)   Social Connection and Isolation Panel [NHANES]    Frequency of Communication with Friends and Family: More than three times a week    Frequency of Social Gatherings with Friends and Family: More than three times a week    Attends Religious Services: Not on file    Active Member of [employer] or Organizations: Not on file    Attends Archivist Meetings: Not on file    Marital Status: Not on file  Intimate Partner Violence: Not At Risk (12/08/2021)   Humiliation, Afraid, Rape, and Kick questionnaire    Fear of Current or Ex-Partner: No    Emotionally Abused: No    Physically Abused: No    Sexually Abused: No    Review of Systems: See HPI, otherwise negative ROS  Physical Exam: BP 128/81   Pulse 75   Temp 98.7 F (37.1 C) (Temporal)   Ht '5\' 8"'$  (1.727 m)   Wt 81.2 kg   LMP 12/08/1997   SpO2 95%   BMI 27.22 kg/m  General:   Alert,  pleasant and cooperative in NAD Head:  Normocephalic and atraumatic. Lungs:  Clear to auscultation.    Heart:  Regular rate and rhythm.   Impression/Plan: [name] is here for ophthalmic surgery.  Risks, benefits, limitations, and alternatives regarding ophthalmic surgery have been reviewed with the patient.  Questions have been answered.  All parties agreeable.   Leandrew Koyanagi, MD  12/05/2022, 9:26 AM

## 2022-12-05 NOTE — Anesthesia Postprocedure Evaluation (Signed)
Anesthesia Post Note  Patient: Carrie Wilson  Procedure(s) Performed: CATARACT EXTRACTION PHACO AND INTRAOCULAR LENS PLACEMENT (IOC) RIGHT MALYUGIN  11.10  01:02.3 (Right: Eye)  Patient location during evaluation: PACU Anesthesia Type: MAC Level of consciousness: awake and alert Pain management: pain level controlled Vital Signs Assessment: post-procedure vital signs reviewed and stable Respiratory status: spontaneous breathing, nonlabored ventilation, respiratory function stable and patient connected to nasal cannula oxygen Cardiovascular status: stable and blood pressure returned to baseline Postop Assessment: no apparent nausea or vomiting Anesthetic complications: no   No notable events documented.   Last Vitals:  Vitals:   12/05/22 1013 12/05/22 1015  BP: 138/79 131/78  Pulse: 63 65  Resp: 17 13  Temp:    SpO2: 99% 96%    Last Pain:  Vitals:   12/05/22 1015  TempSrc:   PainSc: 0-No pain                 Molli Barrows

## 2022-12-05 NOTE — Op Note (Signed)
OPERATIVE NOTE  Carrie Wilson [id number] 12/05/2022   PREOPERATIVE DIAGNOSIS:    Nuclear Sclerotic Cataract Right eye with miotic pupil.        H25.11  POSTOPERATIVE DIAGNOSIS: Nuclear Sclerotic Cataract Right eye with miotic pupil.          PROCEDURE:  Phacoemusification with posterior chamber intraocular lens placement of the right eye which required pupil stretching with the Malyugin pupil expansion device. Ultrasound time: Procedure(s): CATARACT EXTRACTION PHACO AND INTRAOCULAR LENS PLACEMENT (IOC) RIGHT MALYUGIN  11.10  01:02.3 (Right)  LENS:   Implant Name Type Inv. Item Serial No. Manufacturer Lot No. LRB No. Used Action  LENS IOL TECNIS EYHANCE 22.0 - [id number] Intraocular Lens LENS IOL TECNIS EYHANCE 22.0 [id number] [employer]  Right 1 Implanted        SURGEON:  Wyonia Hough, MD   ANESTHESIA:  Topical with tetracaine drops and 2% Xylocaine jelly, augmented with 1% preservative-free intracameral lidocaine.   COMPLICATIONS:  None.   DESCRIPTION OF PROCEDURE:  The patient was identified in the holding room and transported to the operating room and placed in the supine position under the operating microscope. Theright eye was identified as the operative eye and it was prepped and draped in the usual sterile ophthalmic fashion.   A 1 millimeter clear-corneal paracentesis was made at the 12:00 position.  0.5 ml of preservative-free 1% lidocaine was injected into the anterior chamber. The anterior chamber was filled with Viscoat viscoelastic.  A 2.4 millimeter keratome was used to make a near-clear corneal incision at the 9:00 position. A Malyugin pupil expander was then placed through the main incision and into the anterior chamber of the eye.  The edge of the iris was secured on the lip of the pupil expander and it was released, thereby expanding the pupil to approximately 7 millimeters for completion of the cataract surgery.  Additional Viscoat was placed in the  anterior chamber.  A cystotome and capsulorrhexis forceps were used to make a curvilinear capsulorrhexis.   Balanced salt solution was used to hydrodissect and hydrodelineate the lens nucleus.   Phacoemulsification was used in stop and chop fashion to remove the lens, nucleus and epinucleus.  The remaining cortex was aspirated using the irrigation aspiration handpiece.  Additional Provisc was placed into the eye to distend the capsular bag for lens placement.  A lens was then injected into the capsular bag.  The pupil expanding ring was removed using a Kuglen hook and insertion device. The remaining viscoelastic was aspirated from the capsular bag and the anterior chamber.  The anterior chamber was filled with balanced salt solution to inflate to a physiologic pressure.  Wounds were hydrated with balanced salt solution.  The anterior chamber was inflated to a physiologic pressure with balanced salt solution.  No wound leaks were noted.Vigamox 0.2 ml of a '1mg'$  per ml solution was injected into the anterior chamber for a dose of 0.2 mg of intracameral antibiotic at the completion of the case. Timolol and Brimonidine drops were applied to the eye.  The patient was taken to the recovery room in stable condition without complications of anesthesia or surgery.  Mande Auvil 12/05/2022, 10:11 AM

## 2022-12-06 ENCOUNTER — Encounter: Payer: Self-pay | Admitting: Ophthalmology

## 2022-12-12 ENCOUNTER — Encounter: Payer: Self-pay | Admitting: Ophthalmology

## 2022-12-17 NOTE — Discharge Instructions (Signed)

## 2022-12-19 ENCOUNTER — Other Ambulatory Visit: Payer: Self-pay

## 2022-12-19 ENCOUNTER — Ambulatory Visit
Admission: RE | Admit: 2022-12-19 | Discharge: 2022-12-19 | Disposition: A | Discharge status: Home / Self Care | Payer: Medicare HMO | Attending: Ophthalmology | Admitting: Ophthalmology

## 2022-12-19 ENCOUNTER — Encounter: Payer: Self-pay | Admitting: Ophthalmology

## 2022-12-19 ENCOUNTER — Ambulatory Visit: Payer: Medicare HMO | Admitting: Anesthesiology

## 2022-12-19 ENCOUNTER — Encounter
Admission: RE | Disposition: A | Discharge status: Home / Self Care | Payer: Self-pay | Source: Home / Self Care | Attending: Ophthalmology

## 2022-12-19 DIAGNOSIS — Z87891 Personal history of nicotine dependence: Secondary | ICD-10-CM | POA: Diagnosis not present

## 2022-12-19 DIAGNOSIS — J449 Chronic obstructive pulmonary disease, unspecified: Secondary | ICD-10-CM | POA: Insufficient documentation

## 2022-12-19 DIAGNOSIS — H5703 Miosis: Secondary | ICD-10-CM | POA: Insufficient documentation

## 2022-12-19 DIAGNOSIS — Z79899 Other long term (current) drug therapy: Secondary | ICD-10-CM | POA: Diagnosis not present

## 2022-12-19 DIAGNOSIS — H2512 Age-related nuclear cataract, left eye: Secondary | ICD-10-CM | POA: Diagnosis not present

## 2022-12-19 DIAGNOSIS — Z8673 Personal history of transient ischemic attack (TIA), and cerebral infarction without residual deficits: Secondary | ICD-10-CM | POA: Diagnosis not present

## 2022-12-19 DIAGNOSIS — I10 Essential (primary) hypertension: Secondary | ICD-10-CM | POA: Insufficient documentation

## 2022-12-19 HISTORY — PX: CATARACT EXTRACTION W/PHACO: SHX586

## 2022-12-19 SURGERY — PHACOEMULSIFICATION, CATARACT, WITH IOL INSERTION
Anesthesia: Monitor Anesthesia Care | Site: Eye | Laterality: Left

## 2022-12-19 MED ORDER — TETRACAINE HCL 0.5 % OP SOLN
1.0000 [drp] | OPHTHALMIC | Status: DC | PRN
  Administered 2022-12-19 (×3): 1 [drp] via OPHTHALMIC

## 2022-12-19 MED ORDER — SIGHTPATH DOSE#1 BSS IO SOLN
INTRAOCULAR | Status: DC | PRN
  Administered 2022-12-19: 15 mL via INTRAOCULAR

## 2022-12-19 MED ORDER — FENTANYL CITRATE (PF) 100 MCG/2ML IJ SOLN
INTRAMUSCULAR | Status: DC | PRN
  Administered 2022-12-19: 50 ug via INTRAVENOUS

## 2022-12-19 MED ORDER — LACTATED RINGERS IV SOLN: INTRAVENOUS | Status: DC

## 2022-12-19 MED ORDER — SIGHTPATH DOSE#1 NA HYALUR & NA CHOND-NA HYALUR IO KIT
PACK | INTRAOCULAR | Status: DC | PRN
  Administered 2022-12-19: 1 via OPHTHALMIC

## 2022-12-19 MED ORDER — ARMC OPHTHALMIC DILATING DROPS
1.0000 | OPHTHALMIC | Status: DC | PRN
  Administered 2022-12-19 (×3): 1 via OPHTHALMIC

## 2022-12-19 MED ORDER — SIGHTPATH DOSE#1 BSS IO SOLN
INTRAOCULAR | Status: DC | PRN
  Administered 2022-12-19: 66 mL via OPHTHALMIC

## 2022-12-19 MED ORDER — SIGHTPATH DOSE#1 BSS IO SOLN
INTRAOCULAR | Status: DC | PRN
  Administered 2022-12-19: 2 mL

## 2022-12-19 MED ORDER — MIDAZOLAM HCL 2 MG/2ML IJ SOLN
INTRAMUSCULAR | Status: DC | PRN
  Administered 2022-12-19: 1 mg via INTRAVENOUS

## 2022-12-19 MED ORDER — BRIMONIDINE TARTRATE-TIMOLOL 0.2-0.5 % OP SOLN
OPHTHALMIC | Status: DC | PRN
  Administered 2022-12-19: 1 [drp] via OPHTHALMIC

## 2022-12-19 MED ORDER — MOXIFLOXACIN HCL 0.5 % OP SOLN
OPHTHALMIC | Status: DC | PRN
  Administered 2022-12-19: .2 mL via OPHTHALMIC

## 2022-12-19 MED ORDER — SODIUM CHLORIDE 0.9% FLUSH
INTRAVENOUS | Status: DC | PRN
  Administered 2022-12-19: 10 mL via INTRAVENOUS

## 2022-12-19 SURGICAL SUPPLY — 13 items
CANNULA ANT/CHMB 27G (MISCELLANEOUS) IMPLANT
CANNULA ANT/CHMB 27GA (MISCELLANEOUS) IMPLANT
CATARACT SUITE SIGHTPATH (MISCELLANEOUS) ×1 IMPLANT
FEE CATARACT SUITE SIGHTPATH (MISCELLANEOUS) ×1 IMPLANT
GLOVE SRG 8 PF TXTR STRL LF DI (GLOVE) ×1 IMPLANT
GLOVE SURG ENC TEXT LTX SZ7.5 (GLOVE) ×1 IMPLANT
GLOVE SURG UNDER POLY LF SZ8 (GLOVE) ×1
LENS IOL TECNIS EYHANCE 21.5 (Intraocular Lens) IMPLANT
NDL FILTER BLUNT 18X1 1/2 (NEEDLE) ×1 IMPLANT
NEEDLE FILTER BLUNT 18X1 1/2 (NEEDLE) ×1 IMPLANT
RING MALYGIN 7.0 (MISCELLANEOUS) IMPLANT
SYR 3ML LL SCALE MARK (SYRINGE) ×1 IMPLANT
WATER STERILE IRR 250ML POUR (IV SOLUTION) ×1 IMPLANT

## 2022-12-19 NOTE — Anesthesia Preprocedure Evaluation (Signed)
Anesthesia Evaluation  Patient identified by MRN, date of birth, ID band Patient awake    Reviewed: Allergy & Precautions, H&P , NPO status , Patient's Chart, lab work & pertinent test results, reviewed documented beta blocker date and time   Airway Mallampati: II  TM Distance: >3 FB Neck ROM: full    Dental no notable dental hx. (+) Teeth Intact   Pulmonary COPD, former smoker   Pulmonary exam normal breath sounds clear to auscultation       Cardiovascular Exercise Tolerance: Good hypertension, On Medications negative cardio ROS  Rhythm:regular Rate:Normal     Neuro/Psych CVA  negative psych ROS   GI/Hepatic negative GI ROS, Neg liver ROS,,,  Endo/Other  negative endocrine ROSdiabetes, Well Controlled    Renal/GU      Musculoskeletal   Abdominal   Peds  Hematology negative hematology ROS (+)   Anesthesia Other Findings   Reproductive/Obstetrics negative OB ROS                              Anesthesia Physical Anesthesia Plan  ASA: 3  Anesthesia Plan: MAC   Post-op Pain Management:    Induction: Intravenous  PONV Risk Score and Plan:   Airway Management Planned: Natural Airway and Nasal Cannula  Additional Equipment:   Intra-op Plan:   Post-operative Plan:   Informed Consent: I have reviewed the patients History and Physical, chart, labs and discussed the procedure including the risks, benefits and alternatives for the proposed anesthesia with the patient or authorized representative who has indicated his/her understanding and acceptance.     Dental Advisory Given  Plan Discussed with: CRNA  Anesthesia Plan Comments: (Patient consented for risks of anesthesia including but not limited to:  - adverse reactions to medications - damage to eyes, teeth, lips or other oral mucosa - nerve damage due to positioning  - sore throat or hoarseness - Damage to heart, brain,  nerves, lungs, other parts of body or loss of life  Patient voiced understanding.)        Anesthesia Quick Evaluation

## 2022-12-19 NOTE — Anesthesia Postprocedure Evaluation (Signed)
Anesthesia Post Note  Patient: Carrie Wilson  Procedure(s) Performed: CATARACT EXTRACTION PHACO AND INTRAOCULAR LENS PLACEMENT (IOC) LEFT MALYUGIN 7.77 00:50.3 (Left: Eye)  Patient location during evaluation: PACU Anesthesia Type: MAC Level of consciousness: awake and alert Pain management: pain level controlled Vital Signs Assessment: post-procedure vital signs reviewed and stable Respiratory status: spontaneous breathing, nonlabored ventilation, respiratory function stable and patient connected to nasal cannula oxygen Cardiovascular status: stable and blood pressure returned to baseline Postop Assessment: no apparent nausea or vomiting Anesthetic complications: no   No notable events documented.   Last Vitals:  Vitals:   12/19/22 1238 12/19/22 1242  BP: (!) 144/78 132/69  Pulse: 88 62  Resp: 14 12  Temp: 36.6 C 36.4 C  SpO2: 99% 97%    Last Pain:  Vitals:   12/19/22 1242  TempSrc:   PainSc: 0-No pain                 Ilene Qua

## 2022-12-19 NOTE — Transfer of Care (Signed)
Immediate Anesthesia Transfer of Care Note  Patient: Carrie Wilson  Procedure(s) Performed: CATARACT EXTRACTION PHACO AND INTRAOCULAR LENS PLACEMENT (IOC) LEFT MALYUGIN 7.77 00:50.3 (Left: Eye)  Patient Location: PACU  Anesthesia Type:MAC  Level of Consciousness: awake, alert , and oriented  Airway & Oxygen Therapy: Patient Spontanous Breathing  Post-op Assessment: Report given to RN  Post vital signs: Reviewed and stable  Last Vitals:  Vitals Value Taken Time  BP 144/78 12/19/22 1238  Temp 36.6 C 12/19/22 1238  Pulse 62 12/19/22 1240  Resp 13 12/19/22 1240  SpO2 98 % 12/19/22 1240  Vitals shown include unvalidated device data.  Last Pain:  Vitals:   12/19/22 1238  TempSrc:   PainSc: 0-No pain         Complications: No notable events documented.

## 2022-12-19 NOTE — Op Note (Signed)
OPERATIVE NOTE  Fatim Vanderschaaf [id number] 12/19/2022  PREOPERATIVE DIAGNOSIS:   Nuclear sclerotic cataract left eye with miotic pupil      H25.12   POSTOPERATIVE DIAGNOSIS:   Nuclear sclerotic cataract left eye with miotic pupil.     PROCEDURE:  Phacoemulsification with posterior chamber intraocular lens implantation of the left eye which required pupil stretching with the Malyugin pupil expansion device  Ultrasound time: Procedure(s): CATARACT EXTRACTION PHACO AND INTRAOCULAR LENS PLACEMENT (IOC) LEFT MALYUGIN 7.77 00:50.3 (Left)  LENS:   Implant Name Type Inv. Item Serial No. Manufacturer Lot No. LRB No. Used Action  LENS IOL TECNIS EYHANCE 21.5 - [id number] Intraocular Lens LENS IOL TECNIS EYHANCE 21.5 [id number] [employer]  Left 1 Implanted       SURGEON:  Wyonia Hough, MD   ANESTHESIA: Topical with tetracaine drops and 2% Xylocaine jelly, augmented with 1% preservative-free intracameral lidocaine.   COMPLICATIONS:  None.   DESCRIPTION OF PROCEDURE:  The patient was identified in the holding room and transported to the operating room and placed in the supine position under the operating microscope.  The left eye was identified as the operative eye and it was prepped and draped in the usual sterile ophthalmic fashion.   A 1 millimeter clear-corneal paracentesis was made at the 1:30 position.  The anterior chamber was filled with Viscoat viscoelastic.  0.5 ml of preservative-free 1% lidocaine was injected into the anterior chamber.  A 2.4 millimeter keratome was used to make a near-clear corneal incision at the 10:30 position.  A Malyugin pupil expander was then placed through the main incision and into the anterior chamber of the eye.  The edge of the iris was secured on the lip of the pupil expander and it was released, thereby expanding the pupil to approximately 7 millimeters for completion of the cataract surgery.  Additional Viscoat was placed in the anterior  chamber.  A cystotome and capsulorrhexis forceps were used to make a curvilinear capsulorrhexis.   Balanced salt solution was used to hydrodissect and hydrodelineate the lens nucleus.   Phacoemulsification was used in stop and chop fashion to remove the lens, nucleus and epinucleus.  The remaining cortex was aspirated using the irrigation aspiration handpiece.  Additional Provisc was placed into the eye to distend the capsular bag for lens placement.  A lens was then injected into the capsular bag.  The pupil expanding ring was removed using a Kuglen hook and insertion device. The remaining viscoelastic was aspirated from the capsular bag and the anterior chamber.  The anterior chamber was filled with balanced salt solution to inflate to a physiologic pressure.   Wounds were hydrated with balanced salt solution.  The anterior chamber was inflated to a physiologic pressure with balanced salt solution.  No wound leaks were noted. Vigamox 0.2 ml of a '1mg'$  per ml solution was injected into the anterior chamber for a dose of 0.2 mg of intracameral antibiotic at the completion of the case.   Timolol and Brimonidine drops were applied to the eye.  The patient was taken to the recovery room in stable condition without complications of anesthesia or surgery.  Columbus Ice 12/19/2022, 12:37 PM

## 2022-12-19 NOTE — H&P (Signed)
[facility]   Primary Care Physician:  Einar Pheasant, MD Ophthalmologist: Dr. Leandrew Koyanagi  Pre-Procedure History & Physical: HPI:  Carrie Wilson is a 67 y.o. female here for ophthalmic surgery.   Past Medical History:  Diagnosis Date   Carotid aneurysm, right ([place]) 09/15/2021    CTA Neck-brain 09/17/2021 [facility]): No evidence of dissection/aneurysm of the carotid or vertebral arteries.  No significant arterial stenosis..->  No ICH, acute infarct or mass.  Secular right maxillary sinus surgery.  Major arterial structures appear normal.  No definite stenoses.  2 mm saccular outpouching along C7 segment of right ICA.   Hx of transient ischemic attack (TIA) 08/01/2021   Recurrent episode September 15, 2021: Normal echo.  CTA neck shows 2 mm saccular aneurysm of right ICA.  MRI brain shows multiple subacute infarcts of left MCA territory mainly watershed areas of frontal and parietal lobes concerning for embolic infarcts.   Hyperlipidemia    Stroke [facility])    Vitamin D deficiency     Past Surgical History:  Procedure Laterality Date   CATARACT EXTRACTION W/PHACO Right 12/05/2022   Procedure: CATARACT EXTRACTION PHACO AND INTRAOCULAR LENS PLACEMENT ([place]) RIGHT MALYUGIN  11.10  01:02.3;  Surgeon: Leandrew Koyanagi, MD;  Location: [place];  Service: Ophthalmology;  Laterality: Right;   CHOLECYSTECTOMY  11/12/1992   rectal fissure repair  11/12/2000   Right Frontal Sinus Surgery Right 02/03/2018   OCULOPLASTIC SURGERY: Procedures or frontal orbital mass removal: Stereotactic computer-assisted navigational procedure-cranial/extradural; right eye socket exploration -orbitotomy with bone flap and lesion removal, right frontal sinus/transorbital exploration (frontal sinusotomy) [facility] Main OR: Dr. [name] (Opthalmology), [name] (ENT)   TRANSTHORACIC ECHOCARDIOGRAM  10/13/2021   Normal LV size and function.  EF 60 to 65%.  Normal diastolic pressure.   Normal RV.  Normal valves   TUBAL LIGATION  11/12/1993    Prior to Admission medications   Medication Sig Start Date End Date Taking? Authorizing Provider  aspirin 81 MG chewable tablet Chew 81 mg by mouth daily. 09/17/21  Yes [provider]  cholecalciferol (VITAMIN D) 1000 units tablet Take 1,000 Units by mouth daily.   Yes [provider]  clopidogrel (PLAVIX) 75 MG tablet Take 1 tablet (75 mg total) by mouth daily. 07/03/22  Yes Einar Pheasant, MD  pravastatin (PRAVACHOL) 10 MG tablet Take 1 tablet (10 mg total) by mouth every other day. 11/02/22  Yes Einar Pheasant, MD    Allergies as of 10/24/2022 - Review Complete 07/15/2022  Allergen Reaction Noted   Codeine sulfate Nausea And Vomiting 12/05/2012   Penicillins Hives 12/05/2012    Family History  Problem Relation Age of Onset   Hypertension Mother    Heart disease Mother    Hypercholesterolemia Brother    Colon cancer Neg Hx    Breast cancer Neg Hx     Social History   Socioeconomic History   Marital status: Married    Spouse name: Not on file   Number of children: 1   Years of education: Not on file   Highest education level: Not on file  Occupational History   Not on file  Tobacco Use   Smoking status: Former    Packs/day: 0.50    Years: 40.00    Total pack years: 20.00    Types: Cigarettes    Quit date: 02/11/2020    Years since quitting: 2.8   Smokeless tobacco: Never  Vaping Use   Vaping Use: Never used  Substance  and Sexual Activity   Alcohol use: No    Alcohol/week: 0.0 standard drinks of alcohol   Drug use: No   Sexual activity: Not on file  Other Topics Concern   Not on file  Social History Narrative   She is very active, always on the go.  Not necessarily routine exercise, but always doing things.  Not limited by any myalgias or arthralgias or dyspnea.   Social Determinants of Health   Financial Resource Strain: Low Risk  (12/08/2021)   Overall Financial Resource Strain  (CARDIA)    Difficulty of Paying Living Expenses: Not hard at all  Food Insecurity: No Food Insecurity (12/08/2021)   Hunger Vital Sign    Worried About Running Out of Food in the Last Year: Never true    Ran Out of Food in the Last Year: Never true  Transportation Needs: No Transportation Needs (12/08/2021)   PRAPARE - Hydrologist (Medical): No    Lack of Transportation (Non-Medical): No  Physical Activity: Sufficiently Active (12/08/2021)   Exercise Vital Sign    Days of Exercise per Week: 5 days    Minutes of Exercise per Session: 30 min  Stress: No Stress Concern Present (12/08/2021)   [place]    Feeling of Stress : Not at all  Social Connections: Unknown (12/08/2021)   Social Connection and Isolation Panel [NHANES]    Frequency of Communication with Friends and Family: More than three times a week    Frequency of Social Gatherings with Friends and Family: More than three times a week    Attends Religious Services: Not on file    Active Member of Clubs or Organizations: Not on file    Attends Archivist Meetings: Not on file    Marital Status: Not on file  Intimate Partner Violence: Not At Risk (12/08/2021)   Humiliation, Afraid, Rape, and Kick questionnaire    Fear of Current or Ex-Partner: No    Emotionally Abused: No    Physically Abused: No    Sexually Abused: No    Review of Systems: See HPI, otherwise negative ROS  Physical Exam: BP (!) 142/74   Temp (!) 96.5 F (35.8 C) (Tympanic)   Ht 5' 9.02" (1.753 m)   Wt 81.6 kg   LMP 12/08/1997   SpO2 99%   BMI 26.57 kg/m  General:   Alert,  pleasant and cooperative in NAD Head:  Normocephalic and atraumatic. Lungs:  Clear to auscultation.    Heart:  Regular rate and rhythm.   Impression/Plan: Carrie Wilson is here for ophthalmic surgery.  Risks, benefits, limitations, and alternatives regarding  ophthalmic surgery have been reviewed with the patient.  Questions have been answered.  All parties agreeable.   Leandrew Koyanagi, MD  12/19/2022, 11:47 AM

## 2022-12-20 ENCOUNTER — Encounter: Payer: Self-pay | Admitting: Ophthalmology

## 2022-12-28 ENCOUNTER — Ambulatory Visit (INDEPENDENT_AMBULATORY_CARE_PROVIDER_SITE_OTHER): Payer: Medicare HMO

## 2022-12-28 VITALS — Wt 180.0 lb

## 2022-12-28 DIAGNOSIS — Z0000 Encounter for general adult medical examination without abnormal findings: Secondary | ICD-10-CM | POA: Diagnosis not present

## 2022-12-28 NOTE — Patient Instructions (Signed)
Carrie Wilson , Thank you for taking time to come for your Medicare Wellness Visit. I appreciate your ongoing commitment to your health goals. Please review the following plan we discussed and let me know if I can assist you in the future.   These are the goals we discussed:  Goals      Maintain Healthy Lifestyle     Stay active Healthy diet Thinking of going back to work. Retired at age 67.        This is a list of the screening recommended for you and due dates:  Health Maintenance  Topic Date Due   DEXA scan (bone density measurement)  12/07/2015   Screening for Lung Cancer  12/21/2021   Eye exam for diabetics  11/15/2022   Flu Shot  02/10/2023*   Pneumonia Vaccine (1 of 2 - PCV) 11/03/2023*   Hemoglobin A1C  05/04/2023   Complete foot exam   07/04/2023   Mammogram  08/08/2023   Yearly kidney function blood test for diabetes  11/03/2023   Yearly kidney health urinalysis for diabetes  11/03/2023   Medicare Annual Wellness Visit  12/29/2023   Colon Cancer Screening  02/03/2031   Hepatitis C Screening: USPSTF Recommendation to screen - Ages 18-79 yo.  Completed   HPV Vaccine  Aged Out   DTaP/Tdap/Td vaccine  Discontinued   COVID-19 Vaccine  Discontinued   Zoster (Shingles) Vaccine  Discontinued  *Topic was postponed. The date shown is not the original due date.    Advanced directives: Advance directive discussed with you today. Even though you declined this today, please call our office should you change your mind, and we can give you the proper paperwork for you to fill out.   Conditions/risks identified: Keep up the great work! Aim for 30 minutes of exercise or brisk walking, 6-8 glasses of water, and 5 servings of fruits and vegetables each day.   Next appointment: Follow up in one year for your annual wellness visit    Preventive Care 65 Years and Older, Female Preventive care refers to lifestyle choices and visits with your health care provider that can promote health  and wellness. What does preventive care include? A yearly physical exam. This is also called an annual well check. Dental exams once or twice a year. Routine eye exams. Ask your health care provider how often you should have your eyes checked. Personal lifestyle choices, including: Daily care of your teeth and gums. Regular physical activity. Eating a healthy diet. Avoiding tobacco and drug use. Limiting alcohol use. Practicing safe sex. Taking low-dose aspirin every day. Taking vitamin and mineral supplements as recommended by your health care provider. What happens during an annual well check? The services and screenings done by your health care provider during your annual well check will depend on your age, overall health, lifestyle risk factors, and family history of disease. Counseling  Your health care provider may ask you questions about your: Alcohol use. Tobacco use. Drug use. Emotional well-being. Home and relationship well-being. Sexual activity. Eating habits. History of falls. Memory and ability to understand (cognition). Work and work Statistician. Reproductive health. Screening  You may have the following tests or measurements: Height, weight, and BMI. Blood pressure. Lipid and cholesterol levels. These may be checked every 5 years, or more frequently if you are over 33 years old. Skin check. Lung cancer screening. You may have this screening every year starting at age 39 if you have a 30-pack-year history of smoking and currently smoke  or have quit within the past 15 years. Fecal occult blood test (FOBT) of the stool. You may have this test every year starting at age 77. Flexible sigmoidoscopy or colonoscopy. You may have a sigmoidoscopy every 5 years or a colonoscopy every 10 years starting at age 73. Hepatitis C blood test. Hepatitis B blood test. Sexually transmitted disease (STD) testing. Diabetes screening. This is done by checking your blood sugar  (glucose) after you have not eaten for a while (fasting). You may have this done every 1-3 years. Bone density scan. This is done to screen for osteoporosis. You may have this done starting at age 8. Mammogram. This may be done every 1-2 years. Talk to your health care provider about how often you should have regular mammograms. Talk with your health care provider about your test results, treatment options, and if necessary, the need for more tests. Vaccines  Your health care provider may recommend certain vaccines, such as: Influenza vaccine. This is recommended every year. Tetanus, diphtheria, and acellular pertussis (Tdap, Td) vaccine. You may need a Td booster every 10 years. Zoster vaccine. You may need this after age 21. Pneumococcal 13-valent conjugate (PCV13) vaccine. One dose is recommended after age 60. Pneumococcal polysaccharide (PPSV23) vaccine. One dose is recommended after age 45. Talk to your health care provider about which screenings and vaccines you need and how often you need them. This information is not intended to replace advice given to you by your health care provider. Make sure you discuss any questions you have with your health care provider. Document Released: 11/25/2015 Document Revised: 07/18/2016 Document Reviewed: 08/30/2015 Elsevier Interactive Patient Education  2017 [place] Prevention in the Home Falls can cause injuries. They can happen to people of all ages. There are many things you can do to make your home safe and to help prevent falls. What can I do on the outside of my home? Regularly fix the edges of walkways and driveways and fix any cracks. Remove anything that might make you trip as you walk through a door, such as a raised step or threshold. Trim any bushes or trees on the path to your home. Use bright outdoor lighting. Clear any walking paths of anything that might make someone trip, such as rocks or tools. Regularly check to see  if handrails are loose or broken. Make sure that both sides of any steps have handrails. Any raised decks and porches should have guardrails on the edges. Have any leaves, snow, or ice cleared regularly. Use sand or salt on walking paths during winter. Clean up any spills in your garage right away. This includes oil or grease spills. What can I do in the bathroom? Use night lights. Install grab bars by the toilet and in the tub and shower. Do not use towel bars as grab bars. Use non-skid mats or decals in the tub or shower. If you need to sit down in the shower, use a plastic, non-slip stool. Keep the floor dry. Clean up any water that spills on the floor as soon as it happens. Remove soap buildup in the tub or shower regularly. Attach bath mats securely with double-sided non-slip rug tape. Do not have throw rugs and other things on the floor that can make you trip. What can I do in the bedroom? Use night lights. Make sure that you have a light by your bed that is easy to reach. Do not use any sheets or blankets that are too big for  your bed. They should not hang down onto the floor. Have a firm chair that has side arms. You can use this for support while you get dressed. Do not have throw rugs and other things on the floor that can make you trip. What can I do in the kitchen? Clean up any spills right away. Avoid walking on wet floors. Keep items that you use a lot in easy-to-reach places. If you need to reach something above you, use a strong step stool that has a grab bar. Keep electrical cords out of the way. Do not use floor polish or wax that makes floors slippery. If you must use wax, use non-skid floor wax. Do not have throw rugs and other things on the floor that can make you trip. What can I do with my stairs? Do not leave any items on the stairs. Make sure that there are handrails on both sides of the stairs and use them. Fix handrails that are broken or loose. Make sure that  handrails are as long as the stairways. Check any carpeting to make sure that it is firmly attached to the stairs. Fix any carpet that is loose or worn. Avoid having throw rugs at the top or bottom of the stairs. If you do have throw rugs, attach them to the floor with carpet tape. Make sure that you have a light switch at the top of the stairs and the bottom of the stairs. If you do not have them, ask someone to add them for you. What else can I do to help prevent falls? Wear shoes that: Do not have high heels. Have rubber bottoms. Are comfortable and fit you well. Are closed at the toe. Do not wear sandals. If you use a stepladder: Make sure that it is fully opened. Do not climb a closed stepladder. Make sure that both sides of the stepladder are locked into place. Ask someone to hold it for you, if possible. Clearly mark and make sure that you can see: Any grab bars or handrails. First and last steps. Where the edge of each step is. Use tools that help you move around (mobility aids) if they are needed. These include: Canes. Walkers. Scooters. Crutches. Turn on the lights when you go into a dark area. Replace any light bulbs as soon as they burn out. Set up your furniture so you have a clear path. Avoid moving your furniture around. If any of your floors are uneven, fix them. If there are any pets around you, be aware of where they are. Review your medicines with your doctor. Some medicines can make you feel dizzy. This can increase your chance of falling. Ask your doctor what other things that you can do to help prevent falls. This information is not intended to replace advice given to you by your health care provider. Make sure you discuss any questions you have with your health care provider. Document Released: 08/25/2009 Document Revised: 04/05/2016 Document Reviewed: 12/03/2014 Elsevier Interactive Patient Education  2017 [employer].

## 2022-12-28 NOTE — Progress Notes (Signed)
Subjective:   Carrie Wilson is a 67 y.o. female who presents for Medicare Annual (Subsequent) preventive examination.  I connected with  Carrie Wilson on 12/28/22 by a audio enabled telemedicine application and verified that I am speaking with the correct person using two identifiers.  Patient Location: Home  Provider Location: Home Office  I discussed the limitations of evaluation and management by telemedicine. The patient expressed understanding and agreed to proceed.   Review of Systems     Cardiac Risk Factors include: advanced age (>42mn, >>80women);diabetes mellitus;dyslipidemia;hypertension;smoking/ tobacco exposure;Other (see comment), Risk factor comments: hx of TIA, atherosclerosis,     Objective:    Today's Vitals   12/28/22 1132  Weight: 180 lb (81.6 kg)  PainSc: 0-No pain   Body mass index is 26.57 kg/m.     12/19/2022   11:31 AM 12/05/2022    8:55 AM 12/08/2021   10:40 AM 12/07/2020   11:12 AM 09/08/2017   11:33 AM 06/03/2017    3:46 PM 10/21/2016   12:57 PM  Advanced Directives  Does Patient Have a Medical Advance Directive? No No No No No No No  Would patient like information on creating a medical advance directive? No - Patient declined No - Patient declined No - Patient declined No - Patient declined   No - Patient declined    Current Medications (verified) Outpatient Encounter Medications as of 12/28/2022  Medication Sig   aspirin 81 MG chewable tablet Chew 81 mg by mouth daily.   cholecalciferol (VITAMIN D) 1000 units tablet Take 1,000 Units by mouth daily.   clopidogrel (PLAVIX) 75 MG tablet Take 1 tablet (75 mg total) by mouth daily.   pravastatin (PRAVACHOL) 10 MG tablet Take 1 tablet (10 mg total) by mouth every other day.   No facility-administered encounter medications on file as of 12/28/2022.    Allergies (verified) Codeine sulfate and Penicillins   History: Past Medical History:  Diagnosis Date   Carotid [place],  right [place] 09/15/[place]    [facility] Neck-brain [facility] No evidence of dissection/aneurysm of the carotid or vertebral arteries.  No significant arterial stenosis..->  No ICH, acute infarct or mass.  Secular right maxillary sinus surgery.  Major arterial structures appear normal.  No definite stenoses.  2 mm saccular outpouching along C7 segment of right ICA.   Hx of transient ischemic attack (TIA) 08/01/2021   Recurrent episode September 15, 2021: Normal echo.  CTA neck shows 2 mm saccular aneurysm of right ICA.  MRI brain shows multiple subacute infarcts of left MCA territory mainly watershed areas of frontal and parietal lobes concerning for embolic infarcts.   Hyperlipidemia    Stroke ([facility]    Vitamin D deficiency    Past Surgical History:  Procedure Laterality Date   CATARACT EXTRACTION W/PHACO Right 12/05/2022   Procedure: CATARACT EXTRACTION PHACO AND INTRAOCULAR LENS PLACEMENT ([place] RIGHT MALYUGIN  11.10  01:02.3;  Surgeon: [name];  [facility]  Service: Ophthalmology;  Laterality: Right;   CATARACT EXTRACTION W/PHACO Left 12/19/2022   Procedure: CATARACT EXTRACTION PHACO AND INTRAOCULAR LENS PLACEMENT (IBiron LEFT MALYUGIN 7.77 00:50.3;  Surgeon: [name];  [place] [facility]  Service: Ophthalmology;  Laterality: Left;   CHOLECYSTECTOMY  11/12/1992   rectal fissure repair  11/12/2000   Right Frontal Sinus Surgery Right 02/03/2018   OCULOPLASTIC SURGERY: Procedures or frontal orbital mass removal: Stereotactic computer-assisted navigational procedure-cranial/extradural; right eye socket exploration -orbitotomy with bone flap and lesion removal,  right frontal sinus/transorbital exploration (frontal sinusotomy) [facility] Main OR: Dr. [name] (Opthalmology), [name] (ENT)   TRANSTHORACIC ECHOCARDIOGRAM  10/13/2021   Normal LV size and function.  EF 60 to 65%.  Normal diastolic pressure.  Normal RV.  Normal valves   TUBAL  LIGATION  11/12/1993   Family History  Problem Relation Age of Onset   Hypertension Mother    Heart disease Mother    Hypercholesterolemia Brother    Colon cancer Neg Hx    Breast cancer Neg Hx    Social History   Socioeconomic History   Marital status: Married    Spouse name: Not on file   Number of children: 1   Years of education: Not on file   Highest education level: Not on file  Occupational History   Not on file  Tobacco Use   Smoking status: Former    Packs/day: 0.50    Years: 40.00    Total pack years: 20.00    Types: Cigarettes    Quit date: 02/11/2020    Years since quitting: 2.8   Smokeless tobacco: Never  Vaping Use   Vaping Use: Never used  Substance and Sexual Activity   Alcohol use: No    Alcohol/week: 0.0 standard drinks of alcohol   Drug use: No   Sexual activity: Not on file  Other Topics Concern   Not on file  Social History Narrative   She is very active, always on the go.  Not necessarily routine exercise, but always doing things.  Not limited by any myalgias or arthralgias or dyspnea.   Social Determinants of Health   Financial Resource Strain: Low Risk  (12/28/2022)   Overall Financial Resource Strain (CARDIA)    Difficulty of Paying Living Expenses: Not hard at all  Food Insecurity: No Food Insecurity (12/28/2022)   Hunger Vital Sign    Worried About Running Out of Food in the Last Year: Never true    Ran Out of Food in the Last Year: Never true  Transportation Needs: No Transportation Needs (12/28/2022)   PRAPARE - Hydrologist (Medical): No    Lack of Transportation (Non-Medical): No  Physical Activity: Sufficiently Active (12/28/2022)   Exercise Vital Sign    Days of Exercise per Week: 5 days    Minutes of Exercise per Session: 30 min  Stress: No Stress Concern Present (12/28/2022)   [place]    Feeling of Stress : Not at all  Social  Connections: [place] (12/28/2022)   Social Connection and Isolation Panel [NHANES]    Frequency of Communication with Friends and Family: More than three times a week    Frequency of Social Gatherings with Friends and Family: More than three times a week    Attends Religious Services: More than 4 times per year    Active Member of [employer] or Organizations: Yes    Attends Music therapist: More than 4 times per year    Marital Status: Married    Tobacco Counseling Counseling given: Not Answered   Clinical Intake:  Pre-visit preparation completed: Yes  Pain : No/denies pain Pain Score: 0-No pain     BMI - recorded: 26.57 Nutritional Status: BMI 25 -29 Overweight Nutritional Risks: None Diabetes: Yes CBG done?: No Did pt. bring in CBG monitor from home?: No  How often do you need to have someone help you when you read instructions, pamphlets, or other  written materials from your doctor or pharmacy?: 1 - Never  Diabetic? Nutrition Risk Assessment:  Has the patient had any N/V/D within the last 2 months?  No  Does the patient have any non-healing wounds?  No  Has the patient had any unintentional weight loss or weight gain?  No   Diabetes:  Is the patient diabetic?  Yes  If diabetic, was a CBG obtained today?  No  Did the patient bring in their glucometer from home?  No  How often do you monitor your CBG's? never.   Financial Strains and Diabetes Management:  Are you having any financial strains with the device, your supplies or your medication? No .  Does the patient want to be seen by Chronic Care Management for management of their diabetes?  No  Would the patient like to be referred to a Nutritionist or for Diabetic Management?  No   Diabetic Exams:  Diabetic Eye Exam: Patient had eye exam on 11/2022, results pending. Diabetic Foot Exam: Completed 07/03/2022    Interpreter Needed?: No  Information entered by :: Leslee Suire,  LPN   Activities of Daily Living    12/28/2022   11:38 AM 12/19/2022   11:28 AM  In your present state of health, do you have any difficulty performing the following activities:  Hearing? 0 0  Vision? 0 0  Difficulty concentrating or making decisions? 0 0  Walking or climbing stairs? 0 0  Dressing or bathing? 0 0  Doing errands, shopping? 0   Preparing Food and eating ? N   Using the Toilet? N   In the past six months, have you accidently leaked urine? N   Do you have problems with loss of bowel control? N   Managing your Medications? N   Managing your Finances? N   Housekeeping or managing your Housekeeping? N     Patient Care Team: Einar Pheasant, MD as PCP - General (Internal Medicine)  Indicate any recent Medical Services you may have received from other than [facility] providers in the past year (date may be approximate).     Assessment:   This is a routine wellness examination for [place].  Hearing/Vision screen Hearing Screening - Comments:: Denies hearing difficulties   Vision Screening - Comments:: Wears rx glasses - up to date with routine eye exams with [facility]. Just had cataract surgery with [facility]  Dietary issues and exercise activities discussed: Current Exercise Habits: Home exercise routine, Type of exercise: walking, Time (Minutes): 30, Frequency (Times/Week): 7, Weekly Exercise (Minutes/Week): 210, Intensity: Moderate, Exercise limited by: None identified   Goals Addressed             This Visit's Progress    Maintain Healthy Lifestyle   On track    Stay active Healthy diet Thinking of going back to work. Retired at age 44.       Depression Screen    12/28/2022   11:36 AM 11/02/2022    8:59 AM 07/03/2022    9:01 AM 03/01/2022    9:19 AM 12/08/2021   10:38 AM 12/07/2020   11:10 AM 08/22/2020    8:34 AM  PHQ 2/9 Scores  PHQ - 2 Score 0 0 0 0 0 0 0    Fall Risk    12/28/2022   11:33 AM 11/02/2022    8:59 AM 07/03/2022    9:01 AM  03/01/2022    9:19 AM 12/08/2021   10:40 AM  Fall Risk   Falls in the  past year? 0 0 0 0 0  Number falls in past yr: 0 0 0  0  Injury with Fall? 0 0 0    Risk for fall due to : No Fall Risks No Fall Risks No Fall Risks No Fall Risks   Follow up Falls prevention discussed Falls evaluation completed Falls evaluation completed Falls evaluation completed Falls evaluation completed    [place]:  Any stairs in or around the home? Yes  If so, are there any without handrails? No  Home free of loose throw rugs in walkways, pet beds, electrical cords, etc? Yes  Adequate lighting in your home to reduce risk of falls? Yes   ASSISTIVE DEVICES UTILIZED TO PREVENT FALLS:  Life alert? No  Use of a cane, walker or w/c? No  Grab bars in the bathroom? Yes  Shower chair or bench in shower? No  Elevated toilet seat or a handicapped toilet? Yes   TIMED UP AND GO:  Was the test performed? No . televisit  Cognitive Function:        12/28/2022   11:39 AM 12/08/2021   12:09 PM  6CIT Screen  What Year? 0 points 0 points  What month? 0 points 0 points  What time? 0 points 0 points  Count back from 20 0 points 0 points  Months in reverse 2 points 0 points  Repeat phrase 4 points 0 points  Total Score 6 points 0 points    Immunizations  There is no immunization history on file for this patient.  TDAP status: Due, Education has been provided regarding the importance of this vaccine. Advised may receive this vaccine at local pharmacy or Health Dept. Aware to provide a copy of the vaccination record if obtained from local pharmacy or Health Dept. Verbalized acceptance and understanding.  Flu Vaccine status: Declined, Education has been provided regarding the importance of this vaccine but patient still declined. Advised may receive this vaccine at local pharmacy or Health Dept. Aware to provide a copy of the vaccination record if obtained from local pharmacy or  Health Dept. Verbalized acceptance and understanding.  Pneumococcal vaccine status: Declined,  Education has been provided regarding the importance of this vaccine but patient still declined. Advised may receive this vaccine at local pharmacy or Health Dept. Aware to provide a copy of the vaccination record if obtained from local pharmacy or Health Dept. Verbalized acceptance and understanding.   Covid-19 vaccine status: Declined, Education has been provided regarding the importance of this vaccine but patient still declined. Advised may receive this vaccine at local pharmacy or Health Dept.or vaccine clinic. Aware to provide a copy of the vaccination record if obtained from local pharmacy or Health Dept. Verbalized acceptance and understanding.  Qualifies for Shingles Vaccine? Yes   Zostavax completed No   Shingrix Completed?: No.    Education has been provided regarding the importance of this vaccine. Patient has been advised to call insurance company to determine out of pocket expense if they have not yet received this vaccine. Advised may also receive vaccine at local pharmacy or Health Dept. Verbalized acceptance and understanding.  Screening Tests Health Maintenance  Topic Date Due   DEXA SCAN  12/07/2015   Lung Cancer Screening  12/21/2021   OPHTHALMOLOGY EXAM  11/15/2022   INFLUENZA VACCINE  02/10/2023 (Originally 06/12/2022)   Pneumonia Vaccine 108+ Years old (1 of 2 - PCV) 11/03/2023 (Originally 10/27/1962)   HEMOGLOBIN A1C  05/04/2023   FOOT EXAM  07/04/2023   MAMMOGRAM  08/08/2023   Diabetic kidney evaluation - eGFR measurement  11/03/2023   Diabetic kidney evaluation - Urine ACR  11/03/2023   Medicare Annual Wellness (AWV)  12/29/2023   COLONOSCOPY (Pts 45-51yr Insurance coverage will need to be confirmed)  02/03/2031   Hepatitis C Screening  Completed   HPV VACCINES  Aged Out   DTaP/Tdap/Td  Discontinued   COVID-19 Vaccine  Discontinued   Zoster Vaccines- Shingrix   Discontinued    Health Maintenance  Health Maintenance Due  Topic Date Due   DEXA SCAN  12/07/2015   Lung Cancer Screening  12/21/2021   OPHTHALMOLOGY EXAM  11/15/2022    Colorectal cancer screening: Type of screening: Colonoscopy. Completed 02/02/2021. Repeat every 10 years  Mammogram status: Completed 08/07/2022. Repeat every year  Declined DEXA - would like to discuss with Dr SNicki Reaperfirst  Lung Cancer Screening: (Low Dose CT Chest recommended if Age 69-80years, 30 pack-year currently smoking OR have quit w/in 15years.) does qualify.   Lung Cancer Screening Referral: done in 2022 - declines repeat at this time  Additional Screening:  Hepatitis C Screening: does qualify; Completed 10/19/2021  Vision Screening: Recommended annual ophthalmology exams for early detection of glaucoma and other disorders of the eye. Is the patient up to date with their annual eye exam?  Yes  Who is the provider or what is the name of the office in which the patient attends annual eye exams? [facility] If pt is not established with a provider, would they like to be referred to a provider to establish care? No .   Dental Screening: Recommended annual dental exams for proper oral hygiene  Community Resource Referral / Chronic Care Management: CRR required this visit?  No   CCM required this visit?  No      Plan:     I have personally reviewed and noted the following in the patient's chart:   Medical and social history Use of alcohol, tobacco or illicit drugs  Current medications and supplements including opioid prescriptions. Patient is not currently taking opioid prescriptions. Functional ability and status Nutritional status Physical activity Advanced directives List of other physicians Hospitalizations, surgeries, and ER visits in previous 12 months Vitals Screenings to include cognitive, depression, and falls Referrals and appointments  In addition, I have reviewed and discussed  with patient certain preventive protocols, quality metrics, and best practice recommendations. A written personalized care plan for preventive services as well as general preventive health recommendations were provided to patient.     ASandrea Hammond LPN   [id number]  Nurse Notes: Declines all vaccines. Declines DEXA and low dose chest CT for now, but says if Dr SNicki Reaperrecommends at her office visit, she will do them.

## 2023-01-11 DIAGNOSIS — Z961 Presence of intraocular lens: Secondary | ICD-10-CM | POA: Diagnosis not present

## 2023-03-04 ENCOUNTER — Encounter: Payer: Self-pay | Admitting: Internal Medicine

## 2023-03-04 ENCOUNTER — Ambulatory Visit (INDEPENDENT_AMBULATORY_CARE_PROVIDER_SITE_OTHER): Payer: Medicare HMO | Admitting: Internal Medicine

## 2023-03-04 ENCOUNTER — Other Ambulatory Visit: Payer: Medicare HMO

## 2023-03-04 VITALS — BP 124/70 | HR 71 | Temp 98.0°F | Resp 16 | Ht 68.0 in | Wt 181.0 lb

## 2023-03-04 DIAGNOSIS — J439 Emphysema, unspecified: Secondary | ICD-10-CM

## 2023-03-04 DIAGNOSIS — K219 Gastro-esophageal reflux disease without esophagitis: Secondary | ICD-10-CM | POA: Diagnosis not present

## 2023-03-04 DIAGNOSIS — D582 Other hemoglobinopathies: Secondary | ICD-10-CM

## 2023-03-04 DIAGNOSIS — E1159 Type 2 diabetes mellitus with other circulatory complications: Secondary | ICD-10-CM

## 2023-03-04 DIAGNOSIS — Z8673 Personal history of transient ischemic attack (TIA), and cerebral infarction without residual deficits: Secondary | ICD-10-CM

## 2023-03-04 DIAGNOSIS — E785 Hyperlipidemia, unspecified: Secondary | ICD-10-CM | POA: Diagnosis not present

## 2023-03-04 DIAGNOSIS — I671 Cerebral aneurysm, nonruptured: Secondary | ICD-10-CM

## 2023-03-04 DIAGNOSIS — Z1231 Encounter for screening mammogram for malignant neoplasm of breast: Secondary | ICD-10-CM

## 2023-03-04 DIAGNOSIS — E1169 Type 2 diabetes mellitus with other specified complication: Secondary | ICD-10-CM | POA: Diagnosis not present

## 2023-03-04 DIAGNOSIS — I700 Atherosclerosis of aorta: Secondary | ICD-10-CM

## 2023-03-04 DIAGNOSIS — H0510 Unspecified chronic inflammatory disorders of orbit: Secondary | ICD-10-CM | POA: Diagnosis not present

## 2023-03-04 DIAGNOSIS — Z720 Tobacco use: Secondary | ICD-10-CM

## 2023-03-04 DIAGNOSIS — E2839 Other primary ovarian failure: Secondary | ICD-10-CM | POA: Diagnosis not present

## 2023-03-04 DIAGNOSIS — I10 Essential (primary) hypertension: Secondary | ICD-10-CM

## 2023-03-04 LAB — BASIC METABOLIC PANEL
BUN: 10 mg/dL (ref 6–23)
CO2: 30 mEq/L (ref 19–32)
Calcium: 9 mg/dL (ref 8.4–10.5)
Chloride: 104 mEq/L (ref 96–112)
Creatinine, Ser: 0.93 mg/dL (ref 0.40–1.20)
GFR: 63.22 mL/min (ref >60.00)
Glucose, Bld: 88 mg/dL (ref 70–99)
Potassium: 4.4 mEq/L (ref 3.5–5.1)
Sodium: 141 mEq/L (ref 135–145)

## 2023-03-04 LAB — LIPID PANEL
Cholesterol: 148 mg/dL (ref 0–200)
HDL: 38 mg/dL — ABNORMAL LOW (ref >39.00)
LDL Cholesterol: 80 mg/dL (ref 0–99)
NonHDL: 109.94
Total CHOL/HDL Ratio: 4
Triglycerides: 148 mg/dL (ref 0.0–149.0)
VLDL: 29.6 mg/dL (ref 0.0–40.0)

## 2023-03-04 LAB — HEPATIC FUNCTION PANEL
ALT: 10 U/L (ref 0–35)
AST: 14 U/L (ref 0–37)
Albumin: 4.1 g/dL (ref 3.5–5.2)
Alkaline Phosphatase: 78 U/L (ref 39–117)
Bilirubin, Direct: 0.1 mg/dL (ref 0.0–0.3)
Total Bilirubin: 0.5 mg/dL (ref 0.2–1.2)
Total Protein: 6.2 g/dL (ref 6.0–8.3)

## 2023-03-04 LAB — HEMOGLOBIN A1C: Hgb A1c MFr Bld: 6.4 % (ref 4.6–6.5)

## 2023-03-04 MED ORDER — CLOPIDOGREL BISULFATE 75 MG PO TABS: 75.0000 mg | ORAL_TABLET | Freq: Every day | ORAL | 3 refills | Status: DC

## 2023-03-04 MED ORDER — PRAVASTATIN SODIUM 10 MG PO TABS: 10.0000 mg | ORAL_TABLET | ORAL | 3 refills | Status: DC

## 2023-03-04 NOTE — Progress Notes (Unsigned)
Subjective:    Patient ID: Carrie Wilson, female    DOB: 1956/03/03, 67 y.o.   MRN: [id number]  Patient here for  Chief Complaint  Patient presents with   Medical Management of Chronic Issues    HPI Here to follow up regarding hypercholesterolemia and diabetes.  Recent cataract surgery.  Did well with surgery.  Stays active.  No chest pain or sob reported.  No abdominal pain or bowel change reported.  No acid reflux.  Overall feels good.  Agreeable for bone density - to be scheduled with next mammogram.  Discussed the need to f/u for lung cancer screening - CT.  Overdue     Past Medical History:  Diagnosis Date   Carotid aneurysm, right 09/15/2021    CTA Neck-brain 09/17/2021 [facility] H): No evidence of dissection/aneurysm of the carotid or vertebral arteries.  No significant arterial stenosis..->  No ICH, acute infarct or mass.  Secular right maxillary sinus surgery.  Major arterial structures appear normal.  No definite stenoses.  2 mm saccular outpouching along C7 segment of right ICA.   Hx of transient ischemic attack (TIA) 08/01/2021   Recurrent episode September 15, 2021: Normal echo.  CTA neck shows 2 mm saccular aneurysm of right ICA.  MRI brain shows multiple subacute infarcts of left MCA territory mainly watershed areas of frontal and parietal lobes concerning for embolic infarcts.   Hyperlipidemia    Stroke    Vitamin D deficiency    Past Surgical History:  Procedure Laterality Date   CATARACT EXTRACTION W/PHACO Right 12/05/2022   Procedure: CATARACT EXTRACTION PHACO AND INTRAOCULAR LENS PLACEMENT (IOC) RIGHT MALYUGIN  11.10  01:02.3;  Surgeon: Lockie Mola, MD;  Location: [facility];  Service: Ophthalmology;  Laterality: Right;   CATARACT EXTRACTION W/PHACO Left 12/19/2022   Procedure: CATARACT EXTRACTION PHACO AND INTRAOCULAR LENS PLACEMENT (IOC) LEFT MALYUGIN 7.77 00:50.3;  Surgeon: Lockie Mola, MD;  Location: [facility];  Service:  Ophthalmology;  Laterality: Left;   CHOLECYSTECTOMY  11/12/1992   rectal fissure repair  11/12/2000   Right Frontal Sinus Surgery Right 02/03/2018   OCULOPLASTIC SURGERY: Procedures or frontal orbital mass removal: Stereotactic computer-assisted navigational procedure-cranial/extradural; right eye socket exploration -orbitotomy with bone flap and lesion removal, right frontal sinus/transorbital exploration (frontal sinusotomy) [facility] Main OR: Dr. Benny Lennert (Opthalmology), Dr Arlys John D. Thorp (ENT)   TRANSTHORACIC ECHOCARDIOGRAM  10/13/2021   Normal LV size and function.  EF 60 to 65%.  Normal diastolic pressure.  Normal RV.  Normal valves   TUBAL LIGATION  11/12/1993   Family History  Problem Relation Age of Onset   Hypertension Mother    Heart disease Mother    Hypercholesterolemia Brother    Colon cancer Neg Hx    Breast cancer Neg Hx    Social History   Socioeconomic History   Marital status: Married    Spouse name: Not on file   Number of children: 1   Years of education: Not on file   Highest education level: Not on file  Occupational History   Not on file  Tobacco Use   Smoking status: Former    Packs/day: 0.50    Years: 40.00    Additional pack years: 0.00    Total pack years: 20.00    Types: Cigarettes    Quit date: 02/11/2020    Years since quitting: 3.0   Smokeless tobacco: Never  Vaping Use   Vaping Use: Never used  Substance and Sexual Activity   Alcohol use:  No    Alcohol/week: 0.0 standard drinks of alcohol   Drug use: No   Sexual activity: Not on file  Other Topics Concern   Not on file  Social History Narrative   She is very active, always on the go.  Not necessarily routine exercise, but always doing things.  Not limited by any myalgias or arthralgias or dyspnea.   Social Determinants of Health   Financial Resource Strain: Low Risk  (12/28/2022)   Overall Financial Resource Strain (CARDIA)    Difficulty of Paying Living Expenses: Not hard at all  Food  Insecurity: No Food Insecurity (12/28/2022)   Hunger Vital Sign    Worried About Running Out of Food in the Last Year: Never true    Ran Out of Food in the Last Year: Never true  Transportation Needs: No Transportation Needs (12/28/2022)   PRAPARE - Administrator, Civil Service (Medical): No    Lack of Transportation (Non-Medical): No  Physical Activity: Sufficiently Active (12/28/2022)   Exercise Vital Sign    Days of Exercise per Week: 5 days    Minutes of Exercise per Session: 30 min  Stress: No Stress Concern Present (12/28/2022)   [employer] of Occupational Health - Occupational Stress Questionnaire    Feeling of Stress : Not at all  Social Connections: Socially Integrated (12/28/2022)   Social Connection and Isolation Panel [NHANES]    Frequency of Communication with Friends and Family: More than three times a week    Frequency of Social Gatherings with Friends and Family: More than three times a week    Attends Religious Services: More than 4 times per year    Active Member of [employer] or Organizations: Yes    Attends Engineer, structural: More than 4 times per year    Marital Status: Married     Review of Systems  Constitutional:  Negative for appetite change and unexpected weight change.  HENT:  Negative for congestion and sinus pressure.   Respiratory:  Negative for cough, chest tightness and shortness of breath.   Cardiovascular:  Negative for chest pain, palpitations and leg swelling.  Gastrointestinal:  Negative for abdominal pain, diarrhea, nausea and vomiting.  Genitourinary:  Negative for difficulty urinating and dysuria.  Musculoskeletal:  Negative for joint swelling and myalgias.  Skin:  Negative for color change and rash.  Neurological:  Negative for dizziness and headaches.  Psychiatric/Behavioral:  Negative for agitation and dysphoric mood.        Objective:     BP 124/70   Pulse 71   Temp 98 F (36.7 C)   Resp 16   Ht 5\' 8"   (1.727 m)   Wt 181 lb (82.1 kg)   LMP 12/08/1997   SpO2 98%   BMI 27.52 kg/m  Wt Readings from Last 3 Encounters:  03/04/23 181 lb (82.1 kg)  12/28/22 180 lb (81.6 kg)  12/19/22 180 lb (81.6 kg)    Physical Exam Vitals reviewed.  Constitutional:      General: She is not in acute distress.    Appearance: Normal appearance.  HENT:     Head: Normocephalic and atraumatic.     Right Ear: External ear normal.     Left Ear: External ear normal.  Eyes:     General: No scleral icterus.       Right eye: No discharge.        Left eye: No discharge.     Conjunctiva/sclera: Conjunctivae normal.  Neck:  Thyroid: No thyromegaly.  Cardiovascular:     Rate and Rhythm: Normal rate and regular rhythm.  Pulmonary:     Effort: No respiratory distress.     Breath sounds: Normal breath sounds. No wheezing.  Abdominal:     General: Bowel sounds are normal.     Palpations: Abdomen is soft.     Tenderness: There is no abdominal tenderness.  Musculoskeletal:        General: No swelling or tenderness.     Cervical back: Neck supple. No tenderness.  Lymphadenopathy:     Cervical: No cervical adenopathy.  Skin:    Findings: No erythema or rash.  Neurological:     Mental Status: She is alert.  Psychiatric:        Mood and Affect: Mood normal.        Behavior: Behavior normal.      Outpatient Encounter Medications as of 03/04/2023  Medication Sig   aspirin 81 MG chewable tablet Chew 81 mg by mouth daily.   cholecalciferol (VITAMIN D) 1000 units tablet Take 1,000 Units by mouth daily.   clopidogrel (PLAVIX) 75 MG tablet Take 1 tablet (75 mg total) by mouth daily.   pravastatin (PRAVACHOL) 10 MG tablet Take 1 tablet (10 mg total) by mouth every other day.   [DISCONTINUED] clopidogrel (PLAVIX) 75 MG tablet Take 1 tablet (75 mg total) by mouth daily.   [DISCONTINUED] pravastatin (PRAVACHOL) 10 MG tablet Take 1 tablet (10 mg total) by mouth every other day.   No facility-administered  encounter medications on file as of 03/04/2023.     Lab Results  Component Value Date   WBC 5.6 11/02/2022   HGB 14.5 11/02/2022   HCT 43.0 11/02/2022   PLT 187.0 11/02/2022   GLUCOSE 98 11/02/2022   CHOL 142 11/02/2022   TRIG 118.0 11/02/2022   HDL 30.90 (L) 11/02/2022   LDLDIRECT 110.0 06/18/2018   LDLCALC 88 11/02/2022   ALT 13 11/02/2022   AST 14 11/02/2022   NA 141 11/02/2022   K 4.0 11/02/2022   CL 103 11/02/2022   CREATININE 0.92 11/02/2022   BUN 9 11/02/2022   CO2 30 11/02/2022   TSH 1.50 11/02/2022   HGBA1C 6.6 (H) 11/02/2022   MICROALBUR 2.8 (H) 11/02/2022    No results found.     Assessment & Plan:  Controlled type 2 diabetes mellitus with other circulatory complication, without long-term current use of insulin -     Basic metabolic panel -     Hemoglobin A1c  Hyperlipidemia associated with type 2 diabetes mellitus -     Hepatic function panel -     Lipid panel  Encounter for screening mammogram for malignant neoplasm of breast -     3D Screening Mammogram, Left and Right; Future  Estrogen deficiency -     DG Bone Density; Future  Other orders -     Clopidogrel Bisulfate; Take 1 tablet (75 mg total) by mouth daily.  Dispense: 90 tablet; Refill: 3 -     Pravastatin Sodium; Take 1 tablet (10 mg total) by mouth every other day.  Dispense: 45 tablet; Refill: 3     Dale [place], MD

## 2023-03-05 ENCOUNTER — Encounter: Payer: Self-pay | Admitting: Internal Medicine

## 2023-03-05 NOTE — Assessment & Plan Note (Signed)
Has been on no medication. No acid reflux.  Follow.

## 2023-03-05 NOTE — Assessment & Plan Note (Signed)
History of TIA (less than two months apart).  MRI as outlined previously  (likely embolic infarcts).  Concern for afib as an etiology.  Saw cardiology.  Discussed loop recorder.  She declined.  Continue aspirin and plavix.

## 2023-03-05 NOTE — Assessment & Plan Note (Signed)
On pravastatin.   

## 2023-03-05 NOTE — Assessment & Plan Note (Signed)
Blood pressure has been doing well on no medication.  Follow.   

## 2023-03-05 NOTE — Assessment & Plan Note (Signed)
Breathing stable.  No cough.  No sob.  Follow.  

## 2023-03-05 NOTE — Assessment & Plan Note (Signed)
Off prednisone and MTX now.  Follow.  

## 2023-03-05 NOTE — Assessment & Plan Note (Signed)
Continues on aspirin and plavix.  Blood pressure controlled.  

## 2023-03-05 NOTE — Assessment & Plan Note (Signed)
Low carb diet and exercise.  Follow met b and a1c.   

## 2023-03-05 NOTE — Assessment & Plan Note (Signed)
On pravastatin.  Low cholesterol diet and exercise.  Follow lipid panel and liver function tests.   

## 2023-03-05 NOTE — Assessment & Plan Note (Signed)
Needs to quit smoking. Overdue screening CT.  Refer back to lung screening program.

## 2023-03-05 NOTE — Assessment & Plan Note (Signed)
Follow cbc.  Have discussed quitting smoking.  

## 2023-04-01 ENCOUNTER — Telehealth: Payer: Self-pay | Admitting: Internal Medicine

## 2023-04-01 ENCOUNTER — Other Ambulatory Visit: Payer: Self-pay

## 2023-04-01 MED ORDER — PRAVASTATIN SODIUM 10 MG PO TABS: 10.0000 mg | ORAL_TABLET | Freq: Every day | ORAL | 3 refills | Status: DC

## 2023-04-01 NOTE — Telephone Encounter (Signed)
Pt need a refill on pravastatin sent to [facility] in [place]

## 2023-04-01 NOTE — Telephone Encounter (Signed)
Resent to [facility]  

## 2023-04-01 NOTE — Telephone Encounter (Signed)
Pt aware.

## 2023-06-27 ENCOUNTER — Encounter: Payer: Self-pay | Admitting: *Deleted

## 2023-07-04 ENCOUNTER — Ambulatory Visit: Payer: Medicare HMO | Admitting: Internal Medicine

## 2023-07-16 DIAGNOSIS — H43813 Vitreous degeneration, bilateral: Secondary | ICD-10-CM | POA: Diagnosis not present

## 2023-07-16 DIAGNOSIS — Z961 Presence of intraocular lens: Secondary | ICD-10-CM | POA: Diagnosis not present

## 2023-07-16 LAB — HM DIABETES EYE EXAM

## 2023-07-30 ENCOUNTER — Ambulatory Visit: Payer: Medicare HMO | Admitting: Internal Medicine

## 2023-07-31 ENCOUNTER — Other Ambulatory Visit: Payer: Self-pay | Admitting: Internal Medicine

## 2023-07-31 ENCOUNTER — Ambulatory Visit: Payer: Medicare HMO | Admitting: Internal Medicine

## 2023-07-31 ENCOUNTER — Encounter: Payer: Self-pay | Admitting: Internal Medicine

## 2023-07-31 VITALS — BP 122/72 | HR 69 | Temp 98.0°F | Ht 68.0 in | Wt 178.6 lb

## 2023-07-31 DIAGNOSIS — E1169 Type 2 diabetes mellitus with other specified complication: Secondary | ICD-10-CM

## 2023-07-31 DIAGNOSIS — F439 Reaction to severe stress, unspecified: Secondary | ICD-10-CM | POA: Insufficient documentation

## 2023-07-31 DIAGNOSIS — E785 Hyperlipidemia, unspecified: Secondary | ICD-10-CM | POA: Diagnosis not present

## 2023-07-31 DIAGNOSIS — I10 Essential (primary) hypertension: Secondary | ICD-10-CM

## 2023-07-31 DIAGNOSIS — I700 Atherosclerosis of aorta: Secondary | ICD-10-CM | POA: Diagnosis not present

## 2023-07-31 DIAGNOSIS — E1159 Type 2 diabetes mellitus with other circulatory complications: Secondary | ICD-10-CM

## 2023-07-31 DIAGNOSIS — J439 Emphysema, unspecified: Secondary | ICD-10-CM

## 2023-07-31 DIAGNOSIS — D582 Other hemoglobinopathies: Secondary | ICD-10-CM | POA: Diagnosis not present

## 2023-07-31 LAB — HEPATIC FUNCTION PANEL
ALT: 9 U/L (ref 0–35)
AST: 13 U/L (ref 0–37)
Albumin: 4.1 g/dL (ref 3.5–5.2)
Alkaline Phosphatase: 85 U/L (ref 39–117)
Bilirubin, Direct: 0.1 mg/dL (ref 0.0–0.3)
Total Bilirubin: 0.5 mg/dL (ref 0.2–1.2)
Total Protein: 6.6 g/dL (ref 6.0–8.3)

## 2023-07-31 LAB — CBC WITH DIFFERENTIAL/PLATELET
Basophils Absolute: 0 10*3/uL (ref 0.0–0.1)
Basophils Relative: 0.6 % (ref 0.0–3.0)
Eosinophils Absolute: 0.1 10*3/uL (ref 0.0–0.7)
Eosinophils Relative: 1.1 % (ref 0.0–5.0)
HCT: 44.2 % (ref 36.0–46.0)
Hemoglobin: 14.5 g/dL (ref 12.0–15.0)
Lymphocytes Relative: 25.9 % (ref 12.0–46.0)
Lymphs Abs: 1.9 10*3/uL (ref 0.7–4.0)
MCHC: 32.8 g/dL (ref 30.0–36.0)
MCV: 94.3 fl (ref 78.0–100.0)
Monocytes Absolute: 0.5 10*3/uL (ref 0.1–1.0)
Monocytes Relative: 6.9 % (ref 3.0–12.0)
Neutro Abs: 4.8 10*3/uL (ref 1.4–7.7)
Neutrophils Relative %: 65.5 % (ref 43.0–77.0)
Platelets: 212 10*3/uL (ref 150.0–400.0)
RBC: 4.68 Mil/uL (ref 3.87–5.11)
RDW: 13.5 % (ref 11.5–15.5)
WBC: 7.4 10*3/uL (ref 4.0–10.5)

## 2023-07-31 LAB — LIPID PANEL
Cholesterol: 157 mg/dL (ref 0–200)
HDL: 42.2 mg/dL (ref >39.00)
LDL Cholesterol: 86 mg/dL (ref 0–99)
NonHDL: 114.93
Total CHOL/HDL Ratio: 4
Triglycerides: 146 mg/dL (ref 0.0–149.0)
VLDL: 29.2 mg/dL (ref 0.0–40.0)

## 2023-07-31 LAB — TSH: TSH: 2.51 u[IU]/mL (ref 0.35–5.50)

## 2023-07-31 LAB — MICROALBUMIN / CREATININE URINE RATIO
Creatinine,U: 229.5 mg/dL
Microalb Creat Ratio: 0.6 mg/g (ref 0.0–30.0)
Microalb, Ur: 1.4 mg/dL (ref 0.0–1.9)

## 2023-07-31 LAB — HEMOGLOBIN A1C: Hgb A1c MFr Bld: 6.5 % (ref 4.6–6.5)

## 2023-07-31 NOTE — Assessment & Plan Note (Signed)
On pravastatin.

## 2023-07-31 NOTE — Assessment & Plan Note (Signed)
Blood pressure has been doing well on no medication.  Follow.

## 2023-07-31 NOTE — Progress Notes (Signed)
Subjective:    Patient ID: Carrie Wilson, female    DOB: 1956-01-19, 67 y.o.   MRN: [id number]  Patient here for  Chief Complaint  Patient presents with   Medication Management    HPI Here to follow up regarding hypercholesterolemia and diabetes. Increased stress with her brother's health issues.  Discussed.  Overall she feels she is handling things relatively well.  Stays active.  No chest pain or sob reported.  No abdominal pain or bowel change reported.  Blood pressure doing well.     Past Medical History:  Diagnosis Date   Carotid aneurysm, right (HCC) 09/15/2021    CTA Neck-brain 09/17/2021 [facility] H): No evidence of dissection/aneurysm of the carotid or vertebral arteries.  No significant arterial stenosis..->  No ICH, acute infarct or mass.  Secular right maxillary sinus surgery.  Major arterial structures appear normal.  No definite stenoses.  2 mm saccular outpouching along C7 segment of right ICA.   Hx of transient ischemic attack (TIA) 08/01/2021   Recurrent episode September 15, 2021: Normal echo.  CTA neck shows 2 mm saccular aneurysm of right ICA.  MRI brain shows multiple subacute infarcts of left MCA territory mainly watershed areas of frontal and parietal lobes concerning for embolic infarcts.   Hyperlipidemia    Stroke [facility])    Vitamin D deficiency    Past Surgical History:  Procedure Laterality Date   CATARACT EXTRACTION W/PHACO Right 12/05/2022   Procedure: CATARACT EXTRACTION PHACO AND INTRAOCULAR LENS PLACEMENT (IOC) RIGHT MALYUGIN  11.10  01:02.3;  Surgeon: Lockie Mola, MD;  Location: [facility];  Service: Ophthalmology;  Laterality: Right;   CATARACT EXTRACTION W/PHACO Left 12/19/2022   Procedure: CATARACT EXTRACTION PHACO AND INTRAOCULAR LENS PLACEMENT (IOC) LEFT MALYUGIN 7.77 00:50.3;  Surgeon: Lockie Mola, MD;  Location: [facility];  Service: Ophthalmology;  Laterality: Left;   CHOLECYSTECTOMY  11/12/1992   rectal  fissure repair  11/12/2000   Right Frontal Sinus Surgery Right 02/03/2018   OCULOPLASTIC SURGERY: Procedures or frontal orbital mass removal: Stereotactic computer-assisted navigational procedure-cranial/extradural; right eye socket exploration -orbitotomy with bone flap and lesion removal, right frontal sinus/transorbital exploration (frontal sinusotomy) [facility] Main OR: Dr. Benny Lennert (Opthalmology), Dr Arlys John D. Thorp (ENT)   TRANSTHORACIC ECHOCARDIOGRAM  10/13/2021   Normal LV size and function.  EF 60 to 65%.  Normal diastolic pressure.  Normal RV.  Normal valves   TUBAL LIGATION  11/12/1993   Family History  Problem Relation Age of Onset   Hypertension Mother    Heart disease Mother    Hypercholesterolemia Brother    Colon cancer Neg Hx    Breast cancer Neg Hx    Social History   Socioeconomic History   Marital status: Married    Spouse name: Not on file   Number of children: 1   Years of education: Not on file   Highest education level: Not on file  Occupational History   Not on file  Tobacco Use   Smoking status: Former    Current packs/day: 0.00    Average packs/day: 0.5 packs/day for 40.0 years (20.0 ttl pk-yrs)    Types: Cigarettes    Start date: 02/11/1980    Quit date: 02/11/2020    Years since quitting: 3.4   Smokeless tobacco: Never  Vaping Use   Vaping status: Never Used  Substance and Sexual Activity   Alcohol use: No    Alcohol/week: 0.0 standard drinks of alcohol   Drug use: No   Sexual activity:  Not on file  Other Topics Concern   Not on file  Social History Narrative   She is very active, always on the go.  Not necessarily routine exercise, but always doing things.  Not limited by any myalgias or arthralgias or dyspnea.   Social Determinants of Health   Financial Resource Strain: Low Risk  (12/28/2022)   Overall Financial Resource Strain (CARDIA)    Difficulty of Paying Living Expenses: Not hard at all  Food Insecurity: No Food Insecurity (12/28/2022)    Hunger Vital Sign    Worried About Running Out of Food in the Last Year: Never true    Ran Out of Food in the Last Year: Never true  Transportation Needs: No Transportation Needs (12/28/2022)   PRAPARE - Administrator, Civil Service (Medical): No    Lack of Transportation (Non-Medical): No  Physical Activity: Sufficiently Active (12/28/2022)   Exercise Vital Sign    Days of Exercise per Week: 5 days    Minutes of Exercise per Session: 30 min  Stress: No Stress Concern Present (12/28/2022)   [employer] of Occupational Health - Occupational Stress Questionnaire    Feeling of Stress : Not at all  Social Connections: Socially Integrated (12/28/2022)   Social Connection and Isolation Panel [NHANES]    Frequency of Communication with Friends and Family: More than three times a week    Frequency of Social Gatherings with Friends and Family: More than three times a week    Attends Religious Services: More than 4 times per year    Active Member of [employer] or Organizations: Yes    Attends Engineer, structural: More than 4 times per year    Marital Status: Married     Review of Systems  Constitutional:  Negative for appetite change and unexpected weight change.  HENT:  Negative for congestion and sinus pressure.   Respiratory:  Negative for cough, chest tightness and shortness of breath.   Cardiovascular:  Negative for chest pain, palpitations and leg swelling.  Gastrointestinal:  Negative for abdominal pain, diarrhea, nausea and vomiting.  Genitourinary:  Negative for difficulty urinating and dysuria.  Musculoskeletal:  Negative for joint swelling and myalgias.  Skin:  Negative for color change and rash.  Neurological:  Negative for dizziness and headaches.  Psychiatric/Behavioral:  Negative for agitation and dysphoric mood.        Objective:     BP 122/72   Pulse 69   Temp 98 F (36.7 C) (Oral)   Ht 5\' 8"  (1.727 m)   Wt 178 lb 9.6 oz (81 kg)   LMP  12/08/1997   SpO2 100%   BMI 27.16 kg/m  Wt Readings from Last 3 Encounters:  07/31/23 178 lb 9.6 oz (81 kg)  03/04/23 181 lb (82.1 kg)  12/28/22 180 lb (81.6 kg)    Physical Exam Vitals reviewed.  Constitutional:      General: She is not in acute distress.    Appearance: Normal appearance.  HENT:     Head: Normocephalic and atraumatic.     Right Ear: External ear normal.     Left Ear: External ear normal.  Eyes:     General: No scleral icterus.       Right eye: No discharge.        Left eye: No discharge.     Conjunctiva/sclera: Conjunctivae normal.  Neck:     Thyroid: No thyromegaly.  Cardiovascular:     Rate and Rhythm: Normal rate and  regular rhythm.  Pulmonary:     Effort: No respiratory distress.     Breath sounds: Normal breath sounds. No wheezing.  Abdominal:     General: Bowel sounds are normal.     Palpations: Abdomen is soft.     Tenderness: There is no abdominal tenderness.  Musculoskeletal:        General: No swelling or tenderness.     Cervical back: Neck supple. No tenderness.  Lymphadenopathy:     Cervical: No cervical adenopathy.  Skin:    Findings: No erythema or rash.  Neurological:     Mental Status: She is alert.  Psychiatric:        Mood and Affect: Mood normal.        Behavior: Behavior normal.      Outpatient Encounter Medications as of 07/31/2023  Medication Sig   aspirin 81 MG chewable tablet Chew 81 mg by mouth daily.   cholecalciferol (VITAMIN D) 1000 units tablet Take 1,000 Units by mouth daily.   clopidogrel (PLAVIX) 75 MG tablet Take 1 tablet (75 mg total) by mouth daily.   pravastatin (PRAVACHOL) 10 MG tablet Take 1 tablet (10 mg total) by mouth daily.   No facility-administered encounter medications on file as of 07/31/2023.     Lab Results  Component Value Date   WBC 5.6 11/02/2022   HGB 14.5 11/02/2022   HCT 43.0 11/02/2022   PLT 187.0 11/02/2022   GLUCOSE 88 03/04/2023   CHOL 148 03/04/2023   TRIG 148.0 03/04/2023    HDL 38.00 (L) 03/04/2023   LDLDIRECT 110.0 06/18/2018   LDLCALC 80 03/04/2023   ALT 10 03/04/2023   AST 14 03/04/2023   NA 141 03/04/2023   K 4.4 03/04/2023   CL 104 03/04/2023   CREATININE 0.93 03/04/2023   BUN 10 03/04/2023   CO2 30 03/04/2023   TSH 1.50 11/02/2022   HGBA1C 6.4 03/04/2023   MICROALBUR 2.8 (H) 11/02/2022    No results found.     Assessment & Plan:  Hyperlipidemia associated with type 2 diabetes mellitus (HCC) Assessment & Plan: On pravastatin.  Low cholesterol diet and exercise.  Follow lipidpanel and liver function tests.   Orders: -     Lipid panel -     Hepatic function panel  Controlled type 2 diabetes mellitus with other circulatory complication, without long-term current use of insulin (HCC) -     Hemoglobin A1c -     Microalbumin / creatinine urine ratio  Essential hypertension Assessment & Plan: Blood pressure has been doing well on no medication.  Follow.    Orders: -     CBC with Differential/Platelet -     TSH  Aortic atherosclerosis (HCC) Assessment & Plan: On pravastatin.    Elevated hemoglobin (HCC) Assessment & Plan: Follow cbc.  Has quit smoking.    Pulmonary emphysema, unspecified emphysema type (HCC) Assessment & Plan: Breathing stable.  No cough.  No sob.  Follow.    Stress Assessment & Plan: Increased stress as outlined.  Discussed.  Does not feel needs any further intervention.  Handling things well.       Dale [place], MD

## 2023-07-31 NOTE — Assessment & Plan Note (Signed)
Increased stress as outlined.  Discussed.  Does not feel needs any further intervention.  Handling things well.

## 2023-07-31 NOTE — Progress Notes (Signed)
Order placed for add on lab - met b.

## 2023-07-31 NOTE — Assessment & Plan Note (Signed)
Follow cbc.  Has quit smoking.

## 2023-07-31 NOTE — Assessment & Plan Note (Signed)
On pravastatin.  Low cholesterol diet and exercise.  Follow lipid panel and liver function tests.

## 2023-07-31 NOTE — Assessment & Plan Note (Signed)
Breathing stable.  No cough.  No sob.  Follow.

## 2023-08-01 ENCOUNTER — Telehealth: Payer: Self-pay

## 2023-08-01 ENCOUNTER — Other Ambulatory Visit (INDEPENDENT_AMBULATORY_CARE_PROVIDER_SITE_OTHER): Payer: Medicare HMO

## 2023-08-01 DIAGNOSIS — I10 Essential (primary) hypertension: Secondary | ICD-10-CM

## 2023-08-01 LAB — BASIC METABOLIC PANEL
BUN: 7 mg/dL (ref 6–23)
CO2: 26 mEq/L (ref 19–32)
Calcium: 9 mg/dL (ref 8.4–10.5)
Chloride: 106 mEq/L (ref 96–112)
Creatinine, Ser: 0.98 mg/dL (ref 0.40–1.20)
GFR: 59.2 mL/min — ABNORMAL LOW (ref >60.00)
Glucose, Bld: 88 mg/dL (ref 70–99)
Potassium: 3.9 mEq/L (ref 3.5–5.1)
Sodium: 144 mEq/L (ref 135–145)

## 2023-08-01 NOTE — Telephone Encounter (Signed)
-----   Message from [place] sent at 07/31/2023 11:34 PM EDT ----- Notify - cholesterol levels are stable. Overall sugar control is stable.  Hgb, thyroid test and liver tests are wnl.

## 2023-08-01 NOTE — Telephone Encounter (Signed)
LMTCB in regards to lab results.

## 2023-08-02 NOTE — Telephone Encounter (Signed)
Pt called back and I read the note to her and she verbalized understanding

## 2023-08-02 NOTE — Telephone Encounter (Signed)
Noted  

## 2023-08-07 NOTE — Telephone Encounter (Signed)
Patient just called and wanted to know can Dr. Lorin Picket call her back. She said she called 2 minutes ago and she missed your call. Her number is [phone]

## 2023-08-08 NOTE — Telephone Encounter (Signed)
Called pt today, she reported that Dr. Lorin Picket did call her back yesterday.  No further needs made known.

## 2023-08-12 ENCOUNTER — Ambulatory Visit
Admission: RE | Admit: 2023-08-12 | Discharge: 2023-08-12 | Disposition: A | Discharge status: Home / Self Care | Payer: Medicare HMO | Source: Ambulatory Visit | Attending: Internal Medicine | Admitting: Internal Medicine

## 2023-08-12 DIAGNOSIS — Z780 Asymptomatic menopausal state: Secondary | ICD-10-CM | POA: Diagnosis not present

## 2023-08-12 DIAGNOSIS — Z1231 Encounter for screening mammogram for malignant neoplasm of breast: Secondary | ICD-10-CM | POA: Diagnosis not present

## 2023-08-12 DIAGNOSIS — E2839 Other primary ovarian failure: Secondary | ICD-10-CM | POA: Diagnosis not present

## 2023-08-12 DIAGNOSIS — M85852 Other specified disorders of bone density and structure, left thigh: Secondary | ICD-10-CM | POA: Diagnosis not present

## 2023-08-14 ENCOUNTER — Telehealth: Payer: Self-pay

## 2023-08-14 NOTE — Telephone Encounter (Signed)
FYI- no history of compression fracture.

## 2023-08-14 NOTE — Telephone Encounter (Signed)
-----   Message from [place] sent at 08/12/2023  9:38 PM EDT ----- Notify - bone density reveals osteopenia.  This means that she has had some bone loss. Does not quite meet criteria for osteoporosis. Recommend calcium, vitamin d and weight bearing exercise.  Please ask her if she has any history of compression fracture.  If so, let me know.

## 2023-08-14 NOTE — Telephone Encounter (Signed)
Pt called back and I read the note and she stated she does not have a history of compression fracture

## 2023-08-14 NOTE — Telephone Encounter (Signed)
Recommend calcium, vitamin D and weight bearing exercise.  (1000 - 1200 mg of calcium per day - so caltrate 600mg  bid or oscal 500mg  bid).  Also vitamin D3 1000 units per day.

## 2023-08-15 NOTE — Telephone Encounter (Signed)
Patient aware of below.

## 2023-08-29 ENCOUNTER — Other Ambulatory Visit: Payer: Self-pay | Admitting: *Deleted

## 2023-08-29 DIAGNOSIS — N289 Disorder of kidney and ureter, unspecified: Secondary | ICD-10-CM

## 2023-09-02 ENCOUNTER — Other Ambulatory Visit (INDEPENDENT_AMBULATORY_CARE_PROVIDER_SITE_OTHER): Payer: Medicare HMO

## 2023-09-02 DIAGNOSIS — N289 Disorder of kidney and ureter, unspecified: Secondary | ICD-10-CM

## 2023-09-03 LAB — BASIC METABOLIC PANEL
BUN: 9 mg/dL (ref 6–23)
CO2: 29 meq/L (ref 19–32)
Calcium: 8.9 mg/dL (ref 8.4–10.5)
Chloride: 104 meq/L (ref 96–112)
Creatinine, Ser: 0.91 mg/dL (ref 0.40–1.20)
GFR: 64.67 mL/min (ref >60.00)
Glucose, Bld: 144 mg/dL — ABNORMAL HIGH (ref 70–99)
Potassium: 4.1 meq/L (ref 3.5–5.1)
Sodium: 141 meq/L (ref 135–145)

## 2023-12-02 ENCOUNTER — Encounter: Payer: Self-pay | Admitting: Internal Medicine

## 2023-12-02 ENCOUNTER — Ambulatory Visit (INDEPENDENT_AMBULATORY_CARE_PROVIDER_SITE_OTHER): Payer: Medicare HMO | Admitting: Internal Medicine

## 2023-12-02 VITALS — BP 124/76 | HR 74 | Temp 98.0°F | Resp 16 | Ht 68.0 in | Wt 176.0 lb

## 2023-12-02 DIAGNOSIS — E1159 Type 2 diabetes mellitus with other circulatory complications: Secondary | ICD-10-CM | POA: Diagnosis not present

## 2023-12-02 DIAGNOSIS — Z0000 Encounter for general adult medical examination without abnormal findings: Secondary | ICD-10-CM

## 2023-12-02 DIAGNOSIS — E559 Vitamin D deficiency, unspecified: Secondary | ICD-10-CM

## 2023-12-02 DIAGNOSIS — E1169 Type 2 diabetes mellitus with other specified complication: Secondary | ICD-10-CM

## 2023-12-02 DIAGNOSIS — D582 Other hemoglobinopathies: Secondary | ICD-10-CM | POA: Diagnosis not present

## 2023-12-02 DIAGNOSIS — E785 Hyperlipidemia, unspecified: Secondary | ICD-10-CM | POA: Diagnosis not present

## 2023-12-02 DIAGNOSIS — I700 Atherosclerosis of aorta: Secondary | ICD-10-CM | POA: Diagnosis not present

## 2023-12-02 DIAGNOSIS — I671 Cerebral aneurysm, nonruptured: Secondary | ICD-10-CM

## 2023-12-02 DIAGNOSIS — Z8673 Personal history of transient ischemic attack (TIA), and cerebral infarction without residual deficits: Secondary | ICD-10-CM | POA: Diagnosis not present

## 2023-12-02 DIAGNOSIS — J439 Emphysema, unspecified: Secondary | ICD-10-CM

## 2023-12-02 DIAGNOSIS — F439 Reaction to severe stress, unspecified: Secondary | ICD-10-CM | POA: Diagnosis not present

## 2023-12-02 DIAGNOSIS — I10 Essential (primary) hypertension: Secondary | ICD-10-CM

## 2023-12-02 LAB — HEPATIC FUNCTION PANEL
ALT: 12 U/L (ref 0–35)
AST: 15 U/L (ref 0–37)
Albumin: 4.2 g/dL (ref 3.5–5.2)
Alkaline Phosphatase: 89 U/L (ref 39–117)
Bilirubin, Direct: 0.1 mg/dL (ref 0.0–0.3)
Total Bilirubin: 0.5 mg/dL (ref 0.2–1.2)
Total Protein: 6.7 g/dL (ref 6.0–8.3)

## 2023-12-02 LAB — BASIC METABOLIC PANEL
BUN: 8 mg/dL (ref 6–23)
CO2: 29 meq/L (ref 19–32)
Calcium: 8.9 mg/dL (ref 8.4–10.5)
Chloride: 105 meq/L (ref 96–112)
Creatinine, Ser: 0.86 mg/dL (ref 0.40–1.20)
GFR: 69.09 mL/min (ref >60.00)
Glucose, Bld: 96 mg/dL (ref 70–99)
Potassium: 4 meq/L (ref 3.5–5.1)
Sodium: 143 meq/L (ref 135–145)

## 2023-12-02 LAB — LIPID PANEL
Cholesterol: 155 mg/dL (ref 0–200)
HDL: 38.6 mg/dL — ABNORMAL LOW (ref >39.00)
LDL Cholesterol: 89 mg/dL (ref 0–99)
NonHDL: 116.81
Total CHOL/HDL Ratio: 4
Triglycerides: 139 mg/dL (ref 0.0–149.0)
VLDL: 27.8 mg/dL (ref 0.0–40.0)

## 2023-12-02 LAB — HEMOGLOBIN A1C: Hgb A1c MFr Bld: 6.6 % — ABNORMAL HIGH (ref 4.6–6.5)

## 2023-12-02 LAB — VITAMIN D 25 HYDROXY (VIT D DEFICIENCY, FRACTURES): VITD: 19.46 ng/mL — ABNORMAL LOW (ref 30.00–100.00)

## 2023-12-02 NOTE — Assessment & Plan Note (Signed)
Low carb diet and exercise.  Follow met b and a1c.   

## 2023-12-02 NOTE — Assessment & Plan Note (Signed)
Blood pressure has been doing well on no medication.  Follow.

## 2023-12-02 NOTE — Assessment & Plan Note (Signed)
Continues on aspirin and plavix.  Blood pressure controlled.  

## 2023-12-02 NOTE — Progress Notes (Signed)
Subjective:    Patient ID: Carrie Wilson, female    DOB: 08-19-1956, 68 y.o.   MRN: [id number]  Patient here for  Chief Complaint  Patient presents with   Annual Exam    HPI Here for a physical exam. Last visit, increased stress - brother's health issues. Discussed. Has set up Hospice for her brother and this has helped.  Overall she feels she is handling things relatively well. Stays active. Does power walking. No chest pain or sob reported. No abdominal pain or bowel change. Discussed lung screening. She plans to schedule.    Past Medical History:  Diagnosis Date   Carotid aneurysm, right (HCC) 09/15/2021    CTA Neck-brain 09/17/2021 [facility]): No evidence of dissection/aneurysm of the carotid or vertebral arteries.  No significant arterial stenosis..->  No ICH, acute infarct or mass.  Secular right maxillary sinus surgery.  Major arterial structures appear normal.  No definite stenoses.  2 mm saccular outpouching along C7 segment of right ICA.   Hx of transient ischemic attack (TIA) 08/01/2021   Recurrent episode September 15, 2021: Normal echo.  CTA neck shows 2 mm saccular aneurysm of right ICA.  MRI brain shows multiple subacute infarcts of left MCA territory mainly watershed areas of frontal and parietal lobes concerning for embolic infarcts.   Hyperlipidemia    Stroke [facility])    Vitamin D deficiency    Past Surgical History:  Procedure Laterality Date   CATARACT EXTRACTION W/PHACO Right 12/05/2022   Procedure: CATARACT EXTRACTION PHACO AND INTRAOCULAR LENS PLACEMENT (IOC) RIGHT MALYUGIN  11.10  01:02.3;  Surgeon: Lockie Mola, MD;  Location: [facility];  Service: Ophthalmology;  Laterality: Right;   CATARACT EXTRACTION W/PHACO Left 12/19/2022   Procedure: CATARACT EXTRACTION PHACO AND INTRAOCULAR LENS PLACEMENT (IOC) LEFT MALYUGIN 7.77 00:50.3;  Surgeon: Lockie Mola, MD;  Location: [facility];  Service: Ophthalmology;  Laterality: Left;    CHOLECYSTECTOMY  11/12/1992   rectal fissure repair  11/12/2000   Right Frontal Sinus Surgery Right 02/03/2018   OCULOPLASTIC SURGERY: Procedures or frontal orbital mass removal: Stereotactic computer-assisted navigational procedure-cranial/extradural; right eye socket exploration -orbitotomy with bone flap and lesion removal, right frontal sinus/transorbital exploration (frontal sinusotomy) [facility] Main OR: Dr. Benny Lennert (Opthalmology), Dr Arlys John D. Thorp (ENT)   TRANSTHORACIC ECHOCARDIOGRAM  10/13/2021   Normal LV size and function.  EF 60 to 65%.  Normal diastolic pressure.  Normal RV.  Normal valves   TUBAL LIGATION  11/12/1993   Family History  Problem Relation Age of Onset   Hypertension Mother    Heart disease Mother    Hypercholesterolemia Brother    Colon cancer Neg Hx    Breast cancer Neg Hx    Social History   Socioeconomic History   Marital status: Married    Spouse name: Not on file   Number of children: 1   Years of education: Not on file   Highest education level: Not on file  Occupational History   Not on file  Tobacco Use   Smoking status: Former    Current packs/day: 0.00    Average packs/day: 0.5 packs/day for 40.0 years (20.0 ttl pk-yrs)    Types: Cigarettes    Start date: 02/11/1980    Quit date: 02/11/2020    Years since quitting: 3.8   Smokeless tobacco: Never  Vaping Use   Vaping status: Never Used  Substance and Sexual Activity   Alcohol use: No    Alcohol/week: 0.0 standard drinks of alcohol  Drug use: No   Sexual activity: Not on file  Other Topics Concern   Not on file  Social History Narrative   She is very active, always on the go.  Not necessarily routine exercise, but always doing things.  Not limited by any myalgias or arthralgias or dyspnea.   Social Drivers of Corporate investment banker Strain: Low Risk  (12/28/2022)   Overall Financial Resource Strain (CARDIA)    Difficulty of Paying Living Expenses: Not hard at all  Food Insecurity:  No Food Insecurity (12/28/2022)   Hunger Vital Sign    Worried About Running Out of Food in the Last Year: Never true    Ran Out of Food in the Last Year: Never true  Transportation Needs: No Transportation Needs (12/28/2022)   PRAPARE - Administrator, Civil Service (Medical): No    Lack of Transportation (Non-Medical): No  Physical Activity: Sufficiently Active (12/28/2022)   Exercise Vital Sign    Days of Exercise per Week: 5 days    Minutes of Exercise per Session: 30 min  Stress: No Stress Concern Present (12/28/2022)   [employer] of Occupational Health - Occupational Stress Questionnaire    Feeling of Stress : Not at all  Social Connections: Socially Integrated (12/28/2022)   Social Connection and Isolation Panel [NHANES]    Frequency of Communication with Friends and Family: More than three times a week    Frequency of Social Gatherings with Friends and Family: More than three times a week    Attends Religious Services: More than 4 times per year    Active Member of [employer] or Organizations: Yes    Attends Engineer, structural: More than 4 times per year    Marital Status: Married     Review of Systems  Constitutional:  Negative for appetite change and unexpected weight change.  HENT:  Negative for congestion, sinus pressure and sore throat.   Eyes:  Negative for pain and visual disturbance.  Respiratory:  Negative for cough, chest tightness and shortness of breath.   Cardiovascular:  Negative for chest pain, palpitations and leg swelling.  Gastrointestinal:  Negative for abdominal pain, diarrhea, nausea and vomiting.  Genitourinary:  Negative for difficulty urinating and dysuria.  Musculoskeletal:  Negative for joint swelling and myalgias.  Skin:  Negative for color change and rash.  Neurological:  Negative for dizziness and headaches.  Hematological:  Negative for adenopathy. Does not bruise/bleed easily.  Psychiatric/Behavioral:  Negative for  agitation and dysphoric mood.        Objective:     BP 124/76   Pulse 74   Temp 98 F (36.7 C)   Resp 16   Ht 5\' 8"  (1.727 m)   Wt 176 lb (79.8 kg)   LMP 12/08/1997   SpO2 98%   BMI 26.76 kg/m  Wt Readings from Last 3 Encounters:  12/02/23 176 lb (79.8 kg)  07/31/23 178 lb 9.6 oz (81 kg)  03/04/23 181 lb (82.1 kg)    Physical Exam Vitals reviewed.  Constitutional:      General: She is not in acute distress.    Appearance: Normal appearance. She is well-developed.  HENT:     Head: Normocephalic and atraumatic.     Right Ear: External ear normal.     Left Ear: External ear normal.     Mouth/Throat:     Pharynx: No oropharyngeal exudate or posterior oropharyngeal erythema.  Eyes:     General: No scleral icterus.  Right eye: No discharge.        Left eye: No discharge.     Conjunctiva/sclera: Conjunctivae normal.  Neck:     Thyroid: No thyromegaly.  Cardiovascular:     Rate and Rhythm: Normal rate and regular rhythm.  Pulmonary:     Effort: No tachypnea, accessory muscle usage or respiratory distress.     Breath sounds: Normal breath sounds. No decreased breath sounds or wheezing.  Chest:  Breasts:    Right: No inverted nipple, mass, nipple discharge or tenderness (no axillary adenopathy).     Left: No inverted nipple, mass, nipple discharge or tenderness (no axilarry adenopathy).  Abdominal:     General: Bowel sounds are normal.     Palpations: Abdomen is soft.     Tenderness: There is no abdominal tenderness.  Musculoskeletal:        General: No swelling or tenderness.     Cervical back: Neck supple.  Lymphadenopathy:     Cervical: No cervical adenopathy.  Skin:    Findings: No erythema or rash.  Neurological:     Mental Status: She is alert and oriented to person, place, and time.  Psychiatric:        Mood and Affect: Mood normal.        Behavior: Behavior normal.         Outpatient Encounter Medications as of 12/02/2023  Medication Sig    aspirin 81 MG chewable tablet Chew 81 mg by mouth daily.   cholecalciferol (VITAMIN D) 1000 units tablet Take 1,000 Units by mouth daily.   clopidogrel (PLAVIX) 75 MG tablet Take 1 tablet (75 mg total) by mouth daily.   pravastatin (PRAVACHOL) 10 MG tablet Take 1 tablet (10 mg total) by mouth daily.   No facility-administered encounter medications on file as of 12/02/2023.     Lab Results  Component Value Date   WBC 7.4 07/31/2023   HGB 14.5 07/31/2023   HCT 44.2 07/31/2023   PLT 212.0 07/31/2023   GLUCOSE 144 (H) 09/02/2023   CHOL 157 07/31/2023   TRIG 146.0 07/31/2023   HDL 42.20 07/31/2023   LDLDIRECT 110.0 06/18/2018   LDLCALC 86 07/31/2023   ALT 9 07/31/2023   AST 13 07/31/2023   NA 141 09/02/2023   K 4.1 09/02/2023   CL 104 09/02/2023   CREATININE 0.91 09/02/2023   BUN 9 09/02/2023   CO2 29 09/02/2023   TSH 2.51 07/31/2023   HGBA1C 6.5 07/31/2023   MICROALBUR 1.4 07/31/2023    MM 3D SCREENING MAMMOGRAM BILATERAL BREAST Result Date: 08/13/2023 CLINICAL DATA:  Screening. EXAM: DIGITAL SCREENING BILATERAL MAMMOGRAM WITH TOMOSYNTHESIS AND CAD TECHNIQUE: Bilateral screening digital craniocaudal and mediolateral oblique mammograms were obtained. Bilateral screening digital breast tomosynthesis was performed. The images were evaluated with computer-aided detection. COMPARISON:  Previous exam(s). ACR Breast Density Category b: There are scattered areas of fibroglandular density. FINDINGS: There are no findings suspicious for malignancy. IMPRESSION: No mammographic evidence of malignancy. A result letter of this screening mammogram will be mailed directly to the patient. RECOMMENDATION: Screening mammogram in one year. (Code:SM-B-01Y) BI-RADS CATEGORY  1: Negative. Electronically Signed   By: Harmon Pier M.D.   On: 08/13/2023 09:17   DG Bone Density Result Date: 08/12/2023 EXAM: DUAL X-RAY ABSORPTIOMETRY (DXA) FOR BONE MINERAL DENSITY IMPRESSION: Your patient Ece Hardwicke  completed a BMD test on 08/12/2023 using the [employer] Advance DXA System (analysis version: 14.10) manufactured by [employer]. The following summarizes the results of our evaluation. Technologist: LCE  PATIENT BIOGRAPHICAL: Name: Desera, Dummer Patient ID: [id number] Birth Date: 02/12/1956 Height: 68.0 in. Gender: Female Exam Date: 08/12/2023 Weight: 179.2 lbs. Indications: Caucasian, Early Menopause, POSTmenopausal Fractures: Treatments: Vitamin D ASSESSMENT: The BMD measured at Femur Neck Left is 0.720 g/cm2 with a T-score of -2.3. This patient is considered osteopenic according to World Health Organization [facility]) criteria. L-3 was excluded due to degenerative changes/compression fracture, etc. The scan quality is good. Site Region Measured Measured WHO Young Adult BMD Date       Age      Classification T-score AP Spine L1-L4 (L3) 08/12/2023 68.7 Osteopenia -1.1 1.035 g/cm2 DualFemur Neck Left 08/12/2023 68.7 Osteopenia -2.3 0.720 g/cm2 World Health Organization [facility]) criteria for post-menopausal, Caucasian Women: Normal:       T-score at or above -1 SD Osteopenia:   T-score between -1 and -2.5 SD Osteoporosis: T-score at or below -2.5 SD RECOMMENDATIONS: 1. All patients should optimize calcium and vitamin D intake. 2. Consider FDA-approved medical therapies in postmenopausal women and men aged 84 years and older, based on the following: a. A hip or vertebral (clinical or morphometric) fracture b. T-score < -2.5 at the femoral neck or spine after appropriate evaluation to exclude secondary causes c. Low bone mass (T-score between -1.0 and -2.5 at the femoral neck or spine) and a 10-year probability of a hip fracture > 3% or a 10-year probability of a major osteoporosis-related fracture > 20% based on the [place]-adapted WHO algorithm d. Clinician judgment and/or patient preferences may indicate treatment for people with 10-year fracture probabilities above or below these levels FOLLOW-UP: People with diagnosed  cases of osteoporosis or at high risk for fracture should have regular bone mineral density tests. For patients eligible for Medicare, routine testing is allowed once every 2 years. The testing frequency can be increased to one year for patients who have rapidly progressing disease, those who are receiving or discontinuing medical therapy to restore bone mass, or have additional risk factors. I have reviewed this report, and agree with the above findings. [facility] Your patient MYSHIA BLANKINSHIP completed a FRAX assessment on 08/12/2023 using the [employer] Advance DXA System (analysis version: 14.10) manufactured by [employer]. The following summarizes the results of our evaluation. Technologist: LCE PATIENT BIOGRAPHICAL: Name: Lenni, Hnat Patient ID: [id number] Birth Date: [birth year] Height:    68.0 in. [age]    [age]       Weight:    179.2 lbs. Ethnicity:  White                            Exam Date: 08/12/2023 FRAX* RESULTS:  (version: 3.5) 10-year Probability of Fracture1 Major Osteoporotic Fracture2 Hip Fracture 13.2% 2.8% Population: [place] (Caucasian) Risk Factors: None Based on Femur (Left) Neck BMD 1 -The 10-year probability of fracture may be lower than reported if the patient has received treatment. 2 -Major Osteoporotic Fracture: Clinical Spine, Forearm, Hip or Shoulder *FRAX is a Armed forces logistics/support/administrative officer of the [employer] of [employer] for Metabolic Bone Disease, a World Science writer (WHO) [employer]. ASSESSMENT: The probability of a major osteoporotic fracture is 13.2% within the next ten years. The probability of a hip fracture is 2.8% within the next ten years. Electronically Signed   By: Frederico Hamman M.D.   On: 08/12/2023 10:21       Assessment & Plan:  Routine general  medical examination at a health care facility  Hyperlipidemia associated with type 2 diabetes mellitus (HCC) -     Hepatic function panel -     Lipid  panel  Controlled type 2 diabetes mellitus with other circulatory complication, without long-term current use of insulin (HCC) Assessment & Plan: Low carb diet and exercise. Follow met b and a1c.   Orders: -     Basic metabolic panel -     Hemoglobin A1c  Essential hypertension Assessment & Plan: Blood pressure has been doing well on no medication.  Follow.    Orders: -     Basic metabolic panel  Health care maintenance Assessment & Plan: Physical today 12/01/22.  PAP 08/19/19 - negative with negative HPV.  Mammogram 08/12/23 - Birads I.  Colonoscopy 01/2021 - internal hemorrhoids. Otherwise normal.     Aortic atherosclerosis (HCC) Assessment & Plan: On pravastatin.    Cerebral aneurysm without rupture Assessment & Plan: Continues on aspirin and plavix.  Blood pressure controlled.    Elevated hemoglobin (HCC) Assessment & Plan: Has quit smoking.  Last hgb wnl.    Pulmonary emphysema, unspecified emphysema type (HCC) Assessment & Plan: Breathing stable.  No cough.  No sob.  Follow.    History of TIA (transient ischemic attack) Assessment & Plan: History of TIA (less than two months apart).  MRI as outlined previously  (likely embolic infarcts).  Concern for afib as an etiology.  Saw cardiology.  Previously discussed loop recorder.  She declined.  Continue aspirin and plavix.     Stress Assessment & Plan: Doing better.  Hospice is seeing her brother.  Follow.     Vitamin D deficiency Assessment & Plan: Check vitamin D level with these labs.   Orders: -     VITAMIN D 25 Hydroxy (Vit-D Deficiency, Fractures)     Dale Vinco, MD

## 2023-12-02 NOTE — Assessment & Plan Note (Signed)
Physical today 12/01/22.  PAP 08/19/19 - negative with negative HPV.  Mammogram 08/12/23 - Birads I.  Colonoscopy 01/2021 - internal hemorrhoids. Otherwise normal.

## 2023-12-02 NOTE — Assessment & Plan Note (Signed)
Has quit smoking.  Last hgb wnl.

## 2023-12-02 NOTE — Assessment & Plan Note (Signed)
Check vitamin D level with these labs.

## 2023-12-02 NOTE — Assessment & Plan Note (Signed)
History of TIA (less than two months apart).  MRI as outlined previously  (likely embolic infarcts).  Concern for afib as an etiology.  Saw cardiology.  Previously discussed loop recorder.  She declined.  Continue aspirin and plavix.

## 2023-12-02 NOTE — Assessment & Plan Note (Signed)
Breathing stable.  No cough.  No sob.  Follow.

## 2023-12-02 NOTE — Assessment & Plan Note (Signed)
Doing better.  Hospice is seeing her brother.  Follow.

## 2023-12-02 NOTE — Assessment & Plan Note (Signed)
 On pravastatin.

## 2023-12-04 ENCOUNTER — Other Ambulatory Visit: Payer: Self-pay

## 2023-12-04 MED ORDER — VITAMIN D (ERGOCALCIFEROL) 1.25 MG (50000 UNIT) PO CAPS: 50000.0000 [IU] | ORAL_CAPSULE | ORAL | 0 refills | Status: DC

## 2023-12-30 ENCOUNTER — Ambulatory Visit (INDEPENDENT_AMBULATORY_CARE_PROVIDER_SITE_OTHER): Payer: Medicare HMO | Admitting: *Deleted

## 2023-12-30 VITALS — Ht 68.0 in | Wt 172.0 lb

## 2023-12-30 DIAGNOSIS — Z0000 Encounter for general adult medical examination without abnormal findings: Secondary | ICD-10-CM | POA: Diagnosis not present

## 2023-12-30 NOTE — Patient Instructions (Signed)
Carrie Wilson , Thank you for taking time to come for your Medicare Wellness Visit. I appreciate your ongoing commitment to your health goals. Please review the following plan we discussed and let me know if I can assist you in the future.   Referrals/Orders/Follow-Ups/Clinician Recommendations: Consider updating your vaccines.  This is a list of the screening recommended for you and due dates:  Health Maintenance  Topic Date Due   Screening for Lung Cancer  12/21/2021   Eye exam for diabetics  11/15/2022   Complete foot exam   07/04/2023   Flu Shot  02/10/2024*   Pneumonia Vaccine (1 of 2 - PCV) 12/01/2024*   Hemoglobin A1C  05/31/2024   Yearly kidney health urinalysis for diabetes  07/30/2024   Mammogram  08/11/2024   Yearly kidney function blood test for diabetes  12/01/2024   Medicare Annual Wellness Visit  12/29/2024   DEXA scan (bone density measurement)  08/11/2028   Colon Cancer Screening  02/03/2031   Hepatitis C Screening  Completed   HPV Vaccine  Aged Out   DTaP/Tdap/Td vaccine  Discontinued   COVID-19 Vaccine  Discontinued   Zoster (Shingles) Vaccine  Discontinued  *Topic was postponed. The date shown is not the original due date.    Advanced directives: (Declined) Advance directive discussed with you today. Even though you declined this today, please call our office should you change your mind, and we can give you the proper paperwork for you to fill out.  Next Medicare Annual Wellness Visit scheduled for next year: Yes 01/04/25 @ 1:00

## 2023-12-30 NOTE — Progress Notes (Signed)
Subjective:   Carrie Wilson is a 68 y.o. female who presents for Medicare Annual (Subsequent) preventive examination.  Visit Complete: Virtual I connected with  Carrie Wilson on 12/30/23 by a audio enabled telemedicine application and verified that I am speaking with the correct person using two identifiers. This patient declined Interactive audio and Acupuncturist. Therefore the visit was completed with audio only.   Patient Location: Home  Provider Location: Office/Clinic  I discussed the limitations of evaluation and management by telemedicine. The patient expressed understanding and agreed to proceed.  Vital Signs: Because this visit was a virtual/telehealth visit, some criteria may be missing or patient reported. Any vitals not documented were not able to be obtained and vitals that have been documented are patient reported.   Cardiac Risk Factors include: advanced age (>9men, >86 women);diabetes mellitus;hypertension     Objective:    Today's Vitals   12/30/23 1259  Weight: 172 lb (78 kg)  Height: 5\' 8"  (1.727 m)   Body mass index is 26.15 kg/m.     12/30/2023    1:11 PM 12/19/2022   11:31 AM 12/05/2022    8:55 AM 12/08/2021   10:40 AM 12/07/2020   11:12 AM 09/08/2017   11:33 AM 06/03/2017    3:46 PM  Advanced Directives  Does Patient Have a Medical Advance Directive? No No No No No No No  Would patient like information on creating a medical advance directive? No - Patient declined No - Patient declined No - Patient declined No - Patient declined No - Patient declined      Current Medications (verified) Outpatient Encounter Medications as of 12/30/2023  Medication Sig   aspirin 81 MG chewable tablet Chew 81 mg by mouth daily.   cholecalciferol (VITAMIN D) 1000 units tablet Take 1,000 Units by mouth daily.   clopidogrel (PLAVIX) 75 MG tablet Take 1 tablet (75 mg total) by mouth daily.   pravastatin (PRAVACHOL) 10 MG tablet Take 1 tablet  (10 mg total) by mouth daily.   Vitamin D, Ergocalciferol, (DRISDOL) 1.25 MG (50000 UNIT) CAPS capsule Take 1 capsule (50,000 Units total) by mouth every 7 (seven) days.   No facility-administered encounter medications on file as of 12/30/2023.    Allergies (verified) Codeine sulfate and Penicillins   History: Past Medical History:  Diagnosis Date   Carotid aneurysm, right (HCC) 09/15/2021    CTA Neck-brain 09/17/2021 [facility]): No evidence of dissection/aneurysm of the carotid or vertebral arteries.  No significant arterial stenosis..->  No ICH, acute infarct or mass.  Secular right maxillary sinus surgery.  Major arterial structures appear normal.  No definite stenoses.  2 mm saccular outpouching along C7 segment of right ICA.   Hx of transient ischemic attack (TIA) 08/01/2021   Recurrent episode September 15, 2021: Normal echo.  CTA neck shows 2 mm saccular aneurysm of right ICA.  MRI brain shows multiple subacute infarcts of left MCA territory mainly watershed areas of frontal and parietal lobes concerning for embolic infarcts.   Hyperlipidemia    Stroke [facility])    Vitamin D deficiency    Past Surgical History:  Procedure Laterality Date   CATARACT EXTRACTION W/PHACO Right 12/05/2022   Procedure: CATARACT EXTRACTION PHACO AND INTRAOCULAR LENS PLACEMENT (IOC) RIGHT MALYUGIN  11.10  01:02.3;  Surgeon: Lockie Mola, MD;  Location: [facility];  Service: Ophthalmology;  Laterality: Right;   CATARACT EXTRACTION W/PHACO Left 12/19/2022   Procedure: CATARACT EXTRACTION PHACO AND INTRAOCULAR LENS PLACEMENT (IOC) LEFT MALYUGIN  7.77 00:50.3;  Surgeon: Lockie Mola, MD;  Location: [facility];  Service: Ophthalmology;  Laterality: Left;   CHOLECYSTECTOMY  11/12/1992   rectal fissure repair  11/12/2000   Right Frontal Sinus Surgery Right 02/03/2018   OCULOPLASTIC SURGERY: Procedures or frontal orbital mass removal: Stereotactic computer-assisted navigational  procedure-cranial/extradural; right eye socket exploration -orbitotomy with bone flap and lesion removal, right frontal sinus/transorbital exploration (frontal sinusotomy) [facility] Main OR: Dr. Benny Lennert (Opthalmology), Dr Arlys John D. Thorp (ENT)   TRANSTHORACIC ECHOCARDIOGRAM  10/13/2021   Normal LV size and function.  EF 60 to 65%.  Normal diastolic pressure.  Normal RV.  Normal valves   TUBAL LIGATION  11/12/1993   Family History  Problem Relation Age of Onset   Hypertension Mother    Heart disease Mother    Hypercholesterolemia Brother    Colon cancer Neg Hx    Breast cancer Neg Hx    Social History   Socioeconomic History   Marital status: Married    Spouse name: Not on file   Number of children: 1   Years of education: Not on file   Highest education level: Not on file  Occupational History   Not on file  Tobacco Use   Smoking status: Former    Current packs/day: 0.00    Average packs/day: 0.5 packs/day for 40.0 years (20.0 ttl pk-yrs)    Types: Cigarettes    Start date: 02/11/1980    Quit date: 02/11/2020    Years since quitting: 3.8   Smokeless tobacco: Never  Vaping Use   Vaping status: Never Used  Substance and Sexual Activity   Alcohol use: No    Alcohol/week: 0.0 standard drinks of alcohol   Drug use: No   Sexual activity: Not on file  Other Topics Concern   Not on file  Social History Narrative   She is very active, always on the go.  Not necessarily routine exercise, but always doing things.  Not limited by any myalgias or arthralgias or dyspnea.   Social Drivers of Corporate investment banker Strain: Low Risk  (12/30/2023)   Overall Financial Resource Strain (CARDIA)    Difficulty of Paying Living Expenses: Not hard at all  Food Insecurity: No Food Insecurity (12/30/2023)   Hunger Vital Sign    Worried About Running Out of Food in the Last Year: Never true    Ran Out of Food in the Last Year: Never true  Transportation Needs: No Transportation Needs (12/30/2023)    PRAPARE - Administrator, Civil Service (Medical): No    Lack of Transportation (Non-Medical): No  Physical Activity: Sufficiently Active (12/30/2023)   Exercise Vital Sign    Days of Exercise per Week: 5 days    Minutes of Exercise per Session: 60 min  Stress: No Stress Concern Present (12/30/2023)   [employer] of Occupational Health - Occupational Stress Questionnaire    Feeling of Stress : Not at all  Social Connections: Moderately Isolated (12/30/2023)   Social Connection and Isolation Panel [NHANES]    Frequency of Communication with Friends and Family: More than three times a week    Frequency of Social Gatherings with Friends and Family: More than three times a week    Attends Religious Services: Never    Database administrator or Organizations: No    Attends Banker Meetings: Never    Marital Status: Married    Tobacco Counseling Counseling given: Not Answered   Clinical Intake:  Pre-visit  preparation completed: Yes  Pain : No/denies pain     BMI - recorded: 26.15 Nutritional Risks: None Diabetes: Yes CBG done?: No Did pt. bring in CBG monitor from home?: No  How often do you need to have someone help you when you read instructions, pamphlets, or other written materials from your doctor or pharmacy?: 1 - Never  Interpreter Needed?: No  Information entered by :: R. Niyam Bisping LPN   Activities of Daily Living    12/30/2023    1:02 PM  In your present state of health, do you have any difficulty performing the following activities:  Hearing? 0  Vision? 0  Difficulty concentrating or making decisions? 0  Walking or climbing stairs? 0  Dressing or bathing? 0  Doing errands, shopping? 0  Preparing Food and eating ? N  Using the Toilet? N  In the past six months, have you accidently leaked urine? N  Do you have problems with loss of bowel control? N  Managing your Medications? N  Managing your Finances? N  Housekeeping or managing  your Housekeeping? N    Patient Care Team: Dale Hartsburg, MD as PCP - General (Internal Medicine)  Indicate any recent Medical Services you may have received from other than [facility] providers in the past year (date may be approximate).     Assessment:   This is a routine wellness examination for [place].  Hearing/Vision screen Hearing Screening - Comments:: No issues Vision Screening - Comments:: Readers at times   Goals Addressed             This Visit's Progress    Patient Stated       Wants to go back to work.        Depression Screen    12/30/2023    1:06 PM 12/02/2023   10:10 AM 07/31/2023    8:14 AM 12/28/2022   11:36 AM 11/02/2022    8:59 AM 07/03/2022    9:01 AM 03/01/2022    9:19 AM  PHQ 2/9 Scores  PHQ - 2 Score 0 0 0 0 0 0 0  PHQ- 9 Score 0  0        Fall Risk    12/30/2023    1:03 PM 12/02/2023   10:10 AM 07/31/2023    8:14 AM 12/28/2022   11:33 AM 11/02/2022    8:59 AM  Fall Risk   Falls in the past year? 0 0 0 0 0  Number falls in past yr: 0 0 0 0 0  Injury with Fall? 0 0 0 0 0  Risk for fall due to : No Fall Risks No Fall Risks No Fall Risks No Fall Risks No Fall Risks  Follow up Falls prevention discussed;Falls evaluation completed Falls evaluation completed Falls evaluation completed Falls prevention discussed Falls evaluation completed    MEDICARE RISK AT HOME: Medicare Risk at Home Any stairs in or around the home?: Yes If so, are there any without handrails?: Yes Home free of loose throw rugs in walkways, pet beds, electrical cords, etc?: Yes Adequate lighting in your home to reduce risk of falls?: Yes Life alert?: No Use of a cane, walker or w/c?: No Grab bars in the bathroom?: No Shower chair or bench in shower?: No Elevated toilet seat or a handicapped toilet?: No   Cognitive Function:        12/30/2023    1:11 PM 12/28/2022   11:39 AM 12/08/2021   12:09 PM  6CIT Screen  What Year?  0 points 0 points 0 points  What month? 0  points 0 points 0 points  What time? 0 points 0 points 0 points  Count back from 20 0 points 0 points 0 points  Months in reverse 0 points 2 points 0 points  Repeat phrase 0 points 4 points 0 points  Total Score 0 points 6 points 0 points    Immunizations  There is no immunization history on file for this patient.  TDAP status: Due, Education has been provided regarding the importance of this vaccine. Advised may receive this vaccine at local pharmacy or Health Dept. Aware to provide a copy of the vaccination record if obtained from local pharmacy or Health Dept. Verbalized acceptance and understanding.  Flu Vaccine status: Declined, Education has been provided regarding the importance of this vaccine but patient still declined. Advised may receive this vaccine at local pharmacy or Health Dept. Aware to provide a copy of the vaccination record if obtained from local pharmacy or Health Dept. Verbalized acceptance and understanding.  Pneumococcal vaccine status: Declined,  Education has been provided regarding the importance of this vaccine but patient still declined. Advised may receive this vaccine at local pharmacy or Health Dept. Aware to provide a copy of the vaccination record if obtained from local pharmacy or Health Dept. Verbalized acceptance and understanding.   Covid-19 vaccine status: Declined, Education has been provided regarding the importance of this vaccine but patient still declined. Advised may receive this vaccine at local pharmacy or Health Dept.or vaccine clinic. Aware to provide a copy of the vaccination record if obtained from local pharmacy or Health Dept. Verbalized acceptance and understanding.  Qualifies for Shingles Vaccine? Yes   Zostavax completed No   Shingrix Completed?: No.    Education has been provided regarding the importance of this vaccine. Patient has been advised to call insurance company to determine out of pocket expense if they have not yet received  this vaccine. Advised may also receive vaccine at local pharmacy or Health Dept. Verbalized acceptance and understanding.  Screening Tests Health Maintenance  Topic Date Due   Lung Cancer Screening  12/21/2021   OPHTHALMOLOGY EXAM  11/15/2022   FOOT EXAM  07/04/2023   Medicare Annual Wellness (AWV)  12/29/2023   INFLUENZA VACCINE  02/10/2024 (Originally 06/13/2023)   Pneumonia Vaccine 48+ Years old (1 of 2 - PCV) 12/01/2024 (Originally 10/27/1962)   HEMOGLOBIN A1C  05/31/2024   Diabetic kidney evaluation - Urine ACR  07/30/2024   MAMMOGRAM  08/11/2024   Diabetic kidney evaluation - eGFR measurement  12/01/2024   DEXA SCAN  08/11/2028   Colonoscopy  02/03/2031   Hepatitis C Screening  Completed   HPV VACCINES  Aged Out   DTaP/Tdap/Td  Discontinued   COVID-19 Vaccine  Discontinued   Zoster Vaccines- Shingrix  Discontinued    Health Maintenance  Health Maintenance Due  Topic Date Due   Lung Cancer Screening  12/21/2021   OPHTHALMOLOGY EXAM  11/15/2022   FOOT EXAM  07/04/2023   Medicare Annual Wellness (AWV)  12/29/2023    Colorectal cancer screening: Type of screening: Colonoscopy. Completed 01/2021. Repeat every 10 years  Mammogram status: Completed 07/2023. Repeat every year  Bone Density status: Completed 07/2023. Results reflect: Bone density results: OSTEOPENIA. Repeat every 2 years.  Lung Cancer Screening: (Low Dose CT Chest recommended if Age 85-80 years, 20 pack-year currently smoking OR have quit w/in 15years.) does qualify.    Additional Screening:  Hepatitis C Screening: does qualify; Completed 10/2021  Vision Screening: Recommended annual ophthalmology exams for early detection of glaucoma and other disorders of the eye. Is the patient up to date with their annual eye exam?  Yes  Who is the provider or what is the name of the office in which the patient attends annual eye exams? [facility] Patient will check and see when she is due and will schedule an  appointment  Diabetic Eye exam requested If pt is not established with a provider, would they like to be referred to a provider to establish care? No .   Dental Screening: Recommended annual dental exams for proper oral hygiene  Diabetic Foot Exam: Diabetic Foot Exam: Overdue, Pt has been advised about the importance in completing this exam. Pt is scheduled for diabetic foot exam on Needs to be done at next visit and documented.  Community Resource Referral / Chronic Care Management: CRR required this visit?  No   CCM required this visit?  No     Plan:     I have personally reviewed and noted the following in the patient's chart:   Medical and social history Use of alcohol, tobacco or illicit drugs  Current medications and supplements including opioid prescriptions. Patient is not currently taking opioid prescriptions. Functional ability and status Nutritional status Physical activity Advanced directives List of other physicians Hospitalizations, surgeries, and ER visits in previous 12 months Vitals Screenings to include cognitive, depression, and falls Referrals and appointments  In addition, I have reviewed and discussed with patient certain preventive protocols, quality metrics, and best practice recommendations. A written personalized care plan for preventive services as well as general preventive health recommendations were provided to patient.     Sydell Axon, LPN   1/61/0960   After Visit Summary: (Pick Up) Due to this being a telephonic visit, with patients personalized plan was offered to patient and patient has requested to Pick up at office.  Nurse Notes: None

## 2024-05-07 ENCOUNTER — Other Ambulatory Visit: Payer: Self-pay | Admitting: Internal Medicine

## 2024-05-12 ENCOUNTER — Other Ambulatory Visit: Payer: Self-pay | Admitting: Internal Medicine

## 2024-06-01 ENCOUNTER — Telehealth: Payer: Self-pay

## 2024-06-01 ENCOUNTER — Ambulatory Visit: Payer: Medicare HMO | Admitting: Internal Medicine

## 2024-06-01 VITALS — BP 126/78 | HR 72 | Resp 16 | Ht 68.0 in | Wt 173.0 lb

## 2024-06-01 DIAGNOSIS — I700 Atherosclerosis of aorta: Secondary | ICD-10-CM | POA: Diagnosis not present

## 2024-06-01 DIAGNOSIS — Z8673 Personal history of transient ischemic attack (TIA), and cerebral infarction without residual deficits: Secondary | ICD-10-CM

## 2024-06-01 DIAGNOSIS — I671 Cerebral aneurysm, nonruptured: Secondary | ICD-10-CM

## 2024-06-01 DIAGNOSIS — F439 Reaction to severe stress, unspecified: Secondary | ICD-10-CM

## 2024-06-01 DIAGNOSIS — E785 Hyperlipidemia, unspecified: Secondary | ICD-10-CM

## 2024-06-01 DIAGNOSIS — I10 Essential (primary) hypertension: Secondary | ICD-10-CM

## 2024-06-01 DIAGNOSIS — E1159 Type 2 diabetes mellitus with other circulatory complications: Secondary | ICD-10-CM

## 2024-06-01 DIAGNOSIS — E1169 Type 2 diabetes mellitus with other specified complication: Secondary | ICD-10-CM | POA: Diagnosis not present

## 2024-06-01 DIAGNOSIS — J439 Emphysema, unspecified: Secondary | ICD-10-CM

## 2024-06-01 DIAGNOSIS — Z1231 Encounter for screening mammogram for malignant neoplasm of breast: Secondary | ICD-10-CM

## 2024-06-01 LAB — CBC WITH DIFFERENTIAL/PLATELET
Basophils Absolute: 0.1 K/uL (ref 0.0–0.1)
Basophils Relative: 0.8 % (ref 0.0–3.0)
Eosinophils Absolute: 0.1 K/uL (ref 0.0–0.7)
Eosinophils Relative: 0.8 % (ref 0.0–5.0)
HCT: 44 % (ref 36.0–46.0)
Hemoglobin: 14.7 g/dL (ref 12.0–15.0)
Lymphocytes Relative: 23.1 % (ref 12.0–46.0)
Lymphs Abs: 1.5 K/uL (ref 0.7–4.0)
MCHC: 33.4 g/dL (ref 30.0–36.0)
MCV: 93 fl (ref 78.0–100.0)
Monocytes Absolute: 0.4 K/uL (ref 0.1–1.0)
Monocytes Relative: 6.9 % (ref 3.0–12.0)
Neutro Abs: 4.4 K/uL (ref 1.4–7.7)
Neutrophils Relative %: 68.4 % (ref 43.0–77.0)
Platelets: 200 K/uL (ref 150.0–400.0)
RBC: 4.73 Mil/uL (ref 3.87–5.11)
RDW: 13.7 % (ref 11.5–15.5)
WBC: 6.4 K/uL (ref 4.0–10.5)

## 2024-06-01 LAB — HEPATIC FUNCTION PANEL
ALT: 9 U/L (ref 0–35)
AST: 13 U/L (ref 0–37)
Albumin: 4.2 g/dL (ref 3.5–5.2)
Alkaline Phosphatase: 86 U/L (ref 39–117)
Bilirubin, Direct: 0.1 mg/dL (ref 0.0–0.3)
Total Bilirubin: 0.6 mg/dL (ref 0.2–1.2)
Total Protein: 6.4 g/dL (ref 6.0–8.3)

## 2024-06-01 LAB — LIPID PANEL
Cholesterol: 178 mg/dL (ref 0–200)
HDL: 40 mg/dL (ref >39.00)
LDL Cholesterol: 112 mg/dL — ABNORMAL HIGH (ref 0–99)
NonHDL: 138.33
Total CHOL/HDL Ratio: 4
Triglycerides: 131 mg/dL (ref 0.0–149.0)
VLDL: 26.2 mg/dL (ref 0.0–40.0)

## 2024-06-01 LAB — MICROALBUMIN / CREATININE URINE RATIO
Creatinine,U: 93.9 mg/dL
Microalb Creat Ratio: UNDETERMINED mg/g (ref 0.0–30.0)
Microalb, Ur: 0.7 mg/dL (ref 0.0–1.9)

## 2024-06-01 LAB — BASIC METABOLIC PANEL WITH GFR
BUN: 9 mg/dL (ref 6–23)
CO2: 31 meq/L (ref 19–32)
Calcium: 9.1 mg/dL (ref 8.4–10.5)
Chloride: 103 meq/L (ref 96–112)
Creatinine, Ser: 0.79 mg/dL (ref 0.40–1.20)
GFR: 76.23 mL/min (ref >60.00)
Glucose, Bld: 96 mg/dL (ref 70–99)
Potassium: 4.5 meq/L (ref 3.5–5.1)
Sodium: 141 meq/L (ref 135–145)

## 2024-06-01 LAB — HEMOGLOBIN A1C: Hgb A1c MFr Bld: 6.5 % (ref 4.6–6.5)

## 2024-06-01 LAB — HM DIABETES FOOT EXAM

## 2024-06-01 NOTE — Telephone Encounter (Signed)
 Ok to change f/u to physical in January (when due).

## 2024-06-01 NOTE — Telephone Encounter (Signed)
 We have the following check-out note for patient's visit today: Return in about 4 months (around 10/02/2024) for physical.  Patient's last physical was on 12/02/2023.  I let patient know that we can schedule a follow-up appointment for her in four months.  Patient states she would like for me to check with Dr. Allena Hamilton to see if she wants to wait and see patient at the time her physical would actually be due in January, 2026.  I let patient know that I will send a message to Dr. Hamilton to find out.

## 2024-06-01 NOTE — Telephone Encounter (Signed)
 Are you ok with doing 6 months and doing her CPE in Jan 2026 or do you want to do a 4 month follow up

## 2024-06-01 NOTE — Progress Notes (Signed)
 Subjective:    Patient ID: Carrie Wilson, female    DOB: 06/18/1956, 68 y.o.   MRN: [id number]  Patient here for  Chief Complaint  Patient presents with   Medical Management of Chronic Issues    HPI Here for a scheduled follow up - follow up regarding diabetes, hypercholesterolemia and hypertension. Increased stress brother's health issues. He is currently at [facility] in [facility]. Her sister-n-law passed unexpectedly. Carrie Wilson is having to handle their estate. Stays active. No chest pain or sob reported. No abdominal pain or bowel change reported. Lung screening - overdue. Discussed. Plans to call and schedule. They have reached out to her, but with above issues, Carrie Wilson has not had a chance to contact.    Past Medical History:  Diagnosis Date   Carotid aneurysm, right (HCC) 09/15/2021    CTA Neck-brain 09/17/2021 [facility]): No evidence of dissection/aneurysm of the carotid or vertebral arteries.  No significant arterial stenosis..->  No ICH, acute infarct or mass.  Secular right maxillary sinus surgery.  Major arterial structures appear normal.  No definite stenoses.  2 mm saccular outpouching along C7 segment of right ICA.   Hx of transient ischemic attack (TIA) 08/01/2021   Recurrent episode September 15, 2021: Normal echo.  CTA neck shows 2 mm saccular aneurysm of right ICA.  MRI brain shows multiple subacute infarcts of left MCA territory mainly watershed areas of frontal and parietal lobes concerning for embolic infarcts.   Hyperlipidemia    Stroke [facility])    Vitamin D  deficiency    Past Surgical History:  Procedure Laterality Date   CATARACT EXTRACTION W/PHACO Right 12/05/2022   Procedure: CATARACT EXTRACTION PHACO AND INTRAOCULAR LENS PLACEMENT (IOC) RIGHT MALYUGIN  11.10  01:02.3;  Surgeon: Mittie Gaskin, MD;  Location: [facility];  Service: Ophthalmology;  Laterality: Right;   CATARACT EXTRACTION W/PHACO Left 12/19/2022   Procedure: CATARACT EXTRACTION PHACO AND  INTRAOCULAR LENS PLACEMENT (IOC) LEFT MALYUGIN 7.77 00:50.3;  Surgeon: Mittie Gaskin, MD;  Location: [facility];  Service: Ophthalmology;  Laterality: Left;   CHOLECYSTECTOMY  11/12/1992   rectal fissure repair  11/12/2000   Right Frontal Sinus Surgery Right 02/03/2018   OCULOPLASTIC SURGERY: Procedures or frontal orbital mass removal: Stereotactic computer-assisted navigational procedure-cranial/extradural; right eye socket exploration -orbitotomy with bone flap and lesion removal, right frontal sinus/transorbital exploration (frontal sinusotomy) [facility] Main OR: Dr. Hayes (Opthalmology), Dr Redell D. Thorp (ENT)   TRANSTHORACIC ECHOCARDIOGRAM  10/13/2021   Normal LV size and function.  EF 60 to 65%.  Normal diastolic pressure.  Normal RV.  Normal valves   TUBAL LIGATION  11/12/1993   Family History  Problem Relation Age of Onset   Hypertension Mother    Heart disease Mother    Hypercholesterolemia Brother    Colon cancer Neg Hx    Breast cancer Neg Hx    Social History   Socioeconomic History   Marital status: Married    Spouse name: Not on file   Number of children: 1   Years of education: Not on file   Highest education level: Not on file  Occupational History   Not on file  Tobacco Use   Smoking status: Former    Current packs/day: 0.00    Average packs/day: 0.5 packs/day for 40.0 years (20.0 ttl pk-yrs)    Types: Cigarettes    Start date: 02/11/1980    Quit date: 02/11/2020    Years since quitting: 4.3   Smokeless tobacco: Never  Vaping Use  Vaping status: Never Used  Substance and Sexual Activity   Alcohol use: No    Alcohol/week: 0.0 standard drinks of alcohol   Drug use: No   Sexual activity: Not on file  Other Topics Concern   Not on file  Social History Narrative   Carrie Wilson is very active, always on the go.  Not necessarily routine exercise, but always doing things.  Not limited by any myalgias or arthralgias or dyspnea.   Social Drivers of Manufacturing engineer Strain: Low Risk  (12/30/2023)   Overall Financial Resource Strain (CARDIA)    Difficulty of Paying Living Expenses: Not hard at all  Food Insecurity: No Food Insecurity (12/30/2023)   Hunger Vital Sign    Worried About Running Out of Food in the Last Year: Never true    Ran Out of Food in the Last Year: Never true  Transportation Needs: No Transportation Needs (12/30/2023)   PRAPARE - Administrator, Civil Service (Medical): No    Lack of Transportation (Non-Medical): No  Physical Activity: Sufficiently Active (12/30/2023)   Exercise Vital Sign    Days of Exercise per Week: 5 days    Minutes of Exercise per Session: 60 min  Stress: No Stress Concern Present (12/30/2023)   [employer] of Occupational Health - Occupational Stress Questionnaire    Feeling of Stress : Not at all  Social Connections: Moderately Isolated (12/30/2023)   Social Connection and Isolation Panel    Frequency of Communication with Friends and Family: More than three times a week    Frequency of Social Gatherings with Friends and Family: More than three times a week    Attends Religious Services: Never    Database administrator or Organizations: No    Attends Banker Meetings: Never    Marital Status: Married     Review of Systems  Constitutional:  Negative for appetite change and unexpected weight change.  HENT:  Negative for congestion and sinus pressure.   Respiratory:  Negative for cough, chest tightness and shortness of breath.   Cardiovascular:  Negative for chest pain, palpitations and leg swelling.  Gastrointestinal:  Negative for abdominal pain, diarrhea, nausea and vomiting.  Genitourinary:  Negative for difficulty urinating and dysuria.  Musculoskeletal:  Negative for joint swelling and myalgias.  Skin:  Negative for color change and rash.  Neurological:  Negative for dizziness and headaches.  Psychiatric/Behavioral:  Negative for agitation and  dysphoric mood.        Objective:     BP 126/78   Pulse 72   Resp 16   Ht 5' 8 (1.727 m)   Wt 173 lb (78.5 kg)   LMP 12/08/1997   SpO2 98%   BMI 26.30 kg/m  Wt Readings from Last 3 Encounters:  06/01/24 173 lb (78.5 kg)  12/30/23 172 lb (78 kg)  12/02/23 176 lb (79.8 kg)    Physical Exam Vitals reviewed.  Constitutional:      General: Carrie Wilson is not in acute distress.    Appearance: Normal appearance.  HENT:     Head: Normocephalic and atraumatic.     Right Ear: External ear normal.     Left Ear: External ear normal.     Mouth/Throat:     Pharynx: No oropharyngeal exudate or posterior oropharyngeal erythema.  Eyes:     General: No scleral icterus.       Right eye: No discharge.        Left eye:  No discharge.     Conjunctiva/sclera: Conjunctivae normal.  Neck:     Thyroid : No thyromegaly.  Cardiovascular:     Rate and Rhythm: Normal rate and regular rhythm.  Pulmonary:     Effort: No respiratory distress.     Breath sounds: Normal breath sounds. No wheezing.  Abdominal:     General: Bowel sounds are normal.     Palpations: Abdomen is soft.     Tenderness: There is no abdominal tenderness.  Musculoskeletal:        General: No swelling or tenderness.     Cervical back: Neck supple. No tenderness.  Lymphadenopathy:     Cervical: No cervical adenopathy.  Skin:    Findings: No erythema or rash.  Neurological:     Mental Status: Carrie Wilson is alert.  Psychiatric:        Mood and Affect: Mood normal.        Behavior: Behavior normal.      Diabetic foot exam was performed with the following findings:   No deformities, ulcerations, or other skin breakdown Normal sensation of 10g monofilament Intact posterior tibialis and dorsalis pedis pulses      Outpatient Encounter Medications as of 06/01/2024  Medication Sig   aspirin 81 MG chewable tablet Chew 81 mg by mouth daily.   cholecalciferol (VITAMIN D ) 1000 units tablet Take 1,000 Units by mouth daily.    clopidogrel  (PLAVIX ) 75 MG tablet Take 1 tablet by mouth once daily   pravastatin  (PRAVACHOL ) 10 MG tablet Take 1 tablet by mouth once daily   [DISCONTINUED] Vitamin D , Ergocalciferol , (DRISDOL ) 1.25 MG (50000 UNIT) CAPS capsule Take 1 capsule (50,000 Units total) by mouth every 7 (seven) days.   No facility-administered encounter medications on file as of 06/01/2024.     Lab Results  Component Value Date   WBC 6.4 06/01/2024   HGB 14.7 06/01/2024   HCT 44.0 06/01/2024   PLT 200.0 06/01/2024   GLUCOSE 96 06/01/2024   CHOL 178 06/01/2024   TRIG 131.0 06/01/2024   HDL 40.00 06/01/2024   LDLDIRECT 110.0 06/18/2018   LDLCALC 112 (H) 06/01/2024   ALT 9 06/01/2024   AST 13 06/01/2024   NA 141 06/01/2024   K 4.5 06/01/2024   CL 103 06/01/2024   CREATININE 0.79 06/01/2024   BUN 9 06/01/2024   CO2 31 06/01/2024   TSH 2.51 07/31/2023   HGBA1C 6.5 06/01/2024   MICROALBUR 0.7 06/01/2024    MM 3D SCREENING MAMMOGRAM BILATERAL BREAST Result Date: 08/13/2023 CLINICAL DATA:  Screening. EXAM: DIGITAL SCREENING BILATERAL MAMMOGRAM WITH TOMOSYNTHESIS AND CAD TECHNIQUE: Bilateral screening digital craniocaudal and mediolateral oblique mammograms were obtained. Bilateral screening digital breast tomosynthesis was performed. The images were evaluated with computer-aided detection. COMPARISON:  Previous exam(s). ACR Breast Density Category b: There are scattered areas of fibroglandular density. FINDINGS: There are no findings suspicious for malignancy. IMPRESSION: No mammographic evidence of malignancy. A result letter of this screening mammogram will be mailed directly to the patient. RECOMMENDATION: Screening mammogram in one year. (Code:SM-B-01Y) BI-RADS CATEGORY  1: Negative. Electronically Signed   By: Reyes Phi M.D.   On: 08/13/2023 09:17   DG Bone Density Result Date: 08/12/2023 EXAM: DUAL X-RAY ABSORPTIOMETRY (DXA) FOR BONE MINERAL DENSITY IMPRESSION: Your patient Carrie Wilson completed a  BMD test on 08/12/2023 using the [employer] Advance DXA System (analysis version: 14.10) manufactured by [employer]. The following summarizes the results of our evaluation. Technologist: Carrie Wilson PATIENT BIOGRAPHICAL: Name: Carrie Wilson, Carrie Wilson Patient ID: [id number]  Birth Date: September 16, 1956 Height: 68.0 in. Gender: Female Exam Date: 08/12/2023 Weight: 179.2 lbs. Indications: Caucasian, Early Menopause, POSTmenopausal Fractures: Treatments: Vitamin D  ASSESSMENT: The BMD measured at Femur Neck Left is 0.720 g/cm2 with a T-score of -2.3. This patient is considered osteopenic according to World Health Organization [facility]) criteria. L-3 was excluded due to degenerative changes/compression fracture, etc. The scan quality is good. Site Region Measured Measured WHO Young Adult BMD Date       Age      Classification T-score AP Spine L1-L4 (L3) 08/12/2023 68.7 Osteopenia -1.1 1.035 g/cm2 DualFemur Neck Left 08/12/2023 68.7 Osteopenia -2.3 0.720 g/cm2 World Health Organization [facility]) criteria for post-menopausal, Caucasian Women: Normal:       T-score at or above -1 SD Osteopenia:   T-score between -1 and -2.5 SD Osteoporosis: T-score at or below -2.5 SD RECOMMENDATIONS: 1. All patients should optimize calcium  and vitamin D  intake. 2. Consider FDA-approved medical therapies in postmenopausal women and men aged 52 years and older, based on the following: a. A hip or vertebral (clinical or morphometric) fracture b. T-score < -2.5 at the femoral neck or spine after appropriate evaluation to exclude secondary causes c. Low bone mass (T-score between -1.0 and -2.5 at the femoral neck or spine) and a 10-year probability of a hip fracture > 3% or a 10-year probability of a major osteoporosis-related fracture > 20% based on the [place] -adapted WHO algorithm d. Clinician judgment and/or patient preferences may indicate treatment for people with 10-year fracture probabilities above or below these levels FOLLOW-UP: People with diagnosed cases of  osteoporosis or at high risk for fracture should have regular bone mineral density tests. For patients eligible for Medicare, routine testing is allowed once every 2 years. The testing frequency can be increased to one year for patients who have rapidly progressing disease, those who are receiving or discontinuing medical therapy to restore bone mass, or have additional risk factors. I have reviewed this report, and agree with the above findings. [facility] Your patient Carrie Wilson completed a FRAX assessment on 08/12/2023 using the [employer] Advance DXA System (analysis version: 14.10) manufactured by [employer]. The following summarizes the results of our evaluation. Technologist: Carrie Wilson PATIENT BIOGRAPHICAL: Name: Carrie Wilson, Carrie Wilson Patient ID: [id number] Birth Date: 1956-07-19 Height:    68.0 in. Gender:     [age]    [age]       Weight:    179.2 lbs. Ethnicity:  White                            Exam Date: 08/12/2023 FRAX* RESULTS:  (version: 3.5) 10-year Probability of Fracture1 Major Osteoporotic Fracture2 Hip Fracture 13.2% 2.8% Population: [place]  (Caucasian) Risk Factors: None Based on Femur (Left) Neck BMD 1 -The 10-year probability of fracture may be lower than reported if the patient has received treatment. 2 -Major Osteoporotic Fracture: Clinical Spine, Forearm, Hip or Shoulder *FRAX is a Armed forces logistics/support/administrative officer of the [employer] of [employer] for Metabolic Bone Disease, a World Science writer (WHO) [employer]. ASSESSMENT: The probability of a major osteoporotic fracture is 13.2% within the next ten years. The probability of a hip fracture is 2.8% within the next ten years. Electronically Signed   By: Rosaline Collet M.D.   On: 08/12/2023 10:21       Assessment & Plan:  Visit for screening mammogram -     3D Screening  Mammogram, Left and Right; Future  Hyperlipidemia associated with type 2 diabetes mellitus (HCC) Assessment & Plan: On  pravastatin .  Low cholesterol diet and exercise.  Check lipid panel today.   Orders: -     Lipid panel -     Hepatic function panel -     CBC with Differential/Platelet  Controlled type 2 diabetes mellitus with other circulatory complication, without long-term current use of insulin (HCC) Assessment & Plan: Low carb diet and exercise. Check met b and A1c today.   Orders: -     Hemoglobin A1c -     Basic metabolic panel with GFR -     Microalbumin / creatinine urine ratio  Essential hypertension Assessment & Plan: Blood pressure has been doing well on no medication.  Follow. Check metabolic panel.   Orders: -     Basic metabolic panel with GFR  Aortic atherosclerosis (HCC) Assessment & Plan: On pravastatin .    Cerebral aneurysm without rupture Assessment & Plan: Continues on aspirin and plavix .  Blood pressure controlled.    Pulmonary emphysema, unspecified emphysema type (HCC) Assessment & Plan: Breathing stable. Has quit smoking. Follow.    Stress Assessment & Plan: Increased stress as outlined. Discussed. Carrie Wilson does not feel Carrie Wilson needs any further intervention. Follow.    History of TIA (transient ischemic attack) Assessment & Plan: History of TIA (less than two months apart).  MRI as outlined previously  (likely embolic infarcts).  Concern for afib as an etiology.  Saw cardiology.  Previously discussed loop recorder.  Carrie Wilson declined.  Continue aspirin and plavix .         Allena Hamilton, MD

## 2024-06-02 ENCOUNTER — Ambulatory Visit: Payer: Self-pay | Admitting: Internal Medicine

## 2024-06-05 NOTE — Telephone Encounter (Signed)
 Copied from CRM #[id number]. Topic: Clinical - Lab/Test Results >> Jun 05, 2024  9:49 AM [place] J wrote: Reason for CRM: Patient was calling to follow up on a missed call from [facility]. I was able to relay the message Dr. Glendia left in regards to her medication and continuing her diet & exercise.

## 2024-06-07 ENCOUNTER — Encounter: Payer: Self-pay | Admitting: Internal Medicine

## 2024-06-07 NOTE — Assessment & Plan Note (Signed)
 Low carb diet and exercise.  Check met b and A1c today.

## 2024-06-07 NOTE — Assessment & Plan Note (Signed)
Continues on aspirin and plavix.  Blood pressure controlled.  

## 2024-06-07 NOTE — Assessment & Plan Note (Signed)
 Blood pressure has been doing well on no medication.  Follow. Check metabolic panel.

## 2024-06-07 NOTE — Assessment & Plan Note (Signed)
 Increased stress as outlined. Discussed. She does not feel she needs any further intervention. Follow.

## 2024-06-07 NOTE — Assessment & Plan Note (Signed)
 History of TIA (less than two months apart).  MRI as outlined previously  (likely embolic infarcts).  Concern for afib as an etiology.  Saw cardiology.  Previously discussed loop recorder.  She declined.  Continue aspirin and plavix.

## 2024-06-07 NOTE — Assessment & Plan Note (Signed)
On pravastatin.  Low cholesterol diet and exercise.  Check lipid panel today.

## 2024-06-07 NOTE — Assessment & Plan Note (Signed)
 On pravastatin.

## 2024-06-07 NOTE — Assessment & Plan Note (Signed)
Breathing stable.  Has quit smoking.  Follow.

## 2024-07-16 DIAGNOSIS — Z961 Presence of intraocular lens: Secondary | ICD-10-CM | POA: Diagnosis not present

## 2024-07-16 DIAGNOSIS — D3161 Benign neoplasm of unspecified site of right orbit: Secondary | ICD-10-CM | POA: Diagnosis not present

## 2024-08-07 NOTE — Progress Notes (Signed)
 Carrie Wilson                                          MRN: [id number]   08/07/2024   The [facility]I Quality Team Specialist reviewed this patient medical record for the purposes of chart review for care gap closure. The following were reviewed: abstraction for care gap closure-kidney health evaluation for diabetes:eGFR  and uACR.    VBCI Quality Team

## 2024-08-11 ENCOUNTER — Other Ambulatory Visit: Payer: Self-pay | Admitting: Internal Medicine

## 2024-08-12 ENCOUNTER — Ambulatory Visit
Admission: RE | Admit: 2024-08-12 | Discharge: 2024-08-12 | Disposition: A | Discharge status: Home / Self Care | Source: Ambulatory Visit | Attending: Internal Medicine | Admitting: Internal Medicine

## 2024-08-12 DIAGNOSIS — Z1231 Encounter for screening mammogram for malignant neoplasm of breast: Secondary | ICD-10-CM | POA: Insufficient documentation

## 2024-09-29 NOTE — Telephone Encounter (Signed)
 open in error

## 2024-10-12 ENCOUNTER — Encounter: Payer: Self-pay | Admitting: Acute Care

## 2024-10-12 ENCOUNTER — Other Ambulatory Visit: Payer: Self-pay

## 2024-10-12 DIAGNOSIS — Z122 Encounter for screening for malignant neoplasm of respiratory organs: Secondary | ICD-10-CM

## 2024-10-12 DIAGNOSIS — Z87891 Personal history of nicotine dependence: Secondary | ICD-10-CM

## 2024-10-21 ENCOUNTER — Ambulatory Visit: Admission: RE | Admit: 2024-10-21 | Discharge: 2024-10-21 | Attending: Acute Care | Admitting: Acute Care

## 2024-10-21 DIAGNOSIS — Z87891 Personal history of nicotine dependence: Secondary | ICD-10-CM | POA: Diagnosis not present

## 2024-10-21 DIAGNOSIS — Z122 Encounter for screening for malignant neoplasm of respiratory organs: Secondary | ICD-10-CM | POA: Diagnosis present

## 2024-10-21 DIAGNOSIS — I2510 Atherosclerotic heart disease of native coronary artery without angina pectoris: Secondary | ICD-10-CM | POA: Diagnosis not present

## 2024-10-21 DIAGNOSIS — J432 Centrilobular emphysema: Secondary | ICD-10-CM | POA: Diagnosis not present

## 2024-10-27 ENCOUNTER — Other Ambulatory Visit: Payer: Self-pay | Admitting: Acute Care

## 2024-10-27 ENCOUNTER — Other Ambulatory Visit: Payer: Self-pay | Admitting: Internal Medicine

## 2024-10-27 DIAGNOSIS — Z87891 Personal history of nicotine dependence: Secondary | ICD-10-CM

## 2024-10-27 DIAGNOSIS — Z122 Encounter for screening for malignant neoplasm of respiratory organs: Secondary | ICD-10-CM

## 2024-12-02 ENCOUNTER — Encounter: Payer: Self-pay | Admitting: Internal Medicine

## 2024-12-02 ENCOUNTER — Ambulatory Visit: Admitting: Internal Medicine

## 2024-12-02 VITALS — BP 118/70 | HR 74 | Temp 97.7°F | Ht 68.0 in | Wt 174.0 lb

## 2024-12-02 DIAGNOSIS — E785 Hyperlipidemia, unspecified: Secondary | ICD-10-CM

## 2024-12-02 DIAGNOSIS — E118 Type 2 diabetes mellitus with unspecified complications: Secondary | ICD-10-CM

## 2024-12-02 DIAGNOSIS — E1159 Type 2 diabetes mellitus with other circulatory complications: Secondary | ICD-10-CM

## 2024-12-02 DIAGNOSIS — E559 Vitamin D deficiency, unspecified: Secondary | ICD-10-CM | POA: Diagnosis not present

## 2024-12-02 DIAGNOSIS — I10 Essential (primary) hypertension: Secondary | ICD-10-CM

## 2024-12-02 DIAGNOSIS — E1169 Type 2 diabetes mellitus with other specified complication: Secondary | ICD-10-CM

## 2024-12-02 DIAGNOSIS — Z0000 Encounter for general adult medical examination without abnormal findings: Secondary | ICD-10-CM

## 2024-12-02 DIAGNOSIS — Z8673 Personal history of transient ischemic attack (TIA), and cerebral infarction without residual deficits: Secondary | ICD-10-CM

## 2024-12-02 DIAGNOSIS — J439 Emphysema, unspecified: Secondary | ICD-10-CM | POA: Diagnosis not present

## 2024-12-02 LAB — CBC WITH DIFFERENTIAL/PLATELET
Basophils Absolute: 0 K/uL (ref 0.0–0.1)
Basophils Relative: 0.7 % (ref 0.0–3.0)
Eosinophils Absolute: 0.1 K/uL (ref 0.0–0.7)
Eosinophils Relative: 1.1 % (ref 0.0–5.0)
HCT: 45.1 % (ref 36.0–46.0)
Hemoglobin: 14.9 g/dL (ref 12.0–15.0)
Lymphocytes Relative: 25.1 % (ref 12.0–46.0)
Lymphs Abs: 1.7 K/uL (ref 0.7–4.0)
MCHC: 33 g/dL (ref 30.0–36.0)
MCV: 93.4 fl (ref 78.0–100.0)
Monocytes Absolute: 0.5 K/uL (ref 0.1–1.0)
Monocytes Relative: 6.7 % (ref 3.0–12.0)
Neutro Abs: 4.6 K/uL (ref 1.4–7.7)
Neutrophils Relative %: 66.4 % (ref 43.0–77.0)
Platelets: 201 K/uL (ref 150.0–400.0)
RBC: 4.82 Mil/uL (ref 3.87–5.11)
RDW: 13.8 % (ref 11.5–15.5)
WBC: 6.9 K/uL (ref 4.0–10.5)

## 2024-12-02 LAB — HEMOGLOBIN A1C: Hgb A1c MFr Bld: 6.5 % (ref 4.6–6.5)

## 2024-12-02 LAB — BASIC METABOLIC PANEL WITH GFR
BUN: 6 mg/dL (ref 6–23)
CO2: 30 meq/L (ref 19–32)
Calcium: 9.1 mg/dL (ref 8.4–10.5)
Chloride: 104 meq/L (ref 96–112)
Creatinine, Ser: 0.93 mg/dL (ref 0.40–1.20)
GFR: 62.45 mL/min
Glucose, Bld: 98 mg/dL (ref 70–99)
Potassium: 4.4 meq/L (ref 3.5–5.1)
Sodium: 140 meq/L (ref 135–145)

## 2024-12-02 LAB — HEPATIC FUNCTION PANEL
ALT: 11 U/L (ref 3–35)
AST: 12 U/L (ref 5–37)
Albumin: 4.1 g/dL (ref 3.5–5.2)
Alkaline Phosphatase: 78 U/L (ref 39–117)
Bilirubin, Direct: 0.1 mg/dL (ref 0.1–0.3)
Total Bilirubin: 0.5 mg/dL (ref 0.2–1.2)
Total Protein: 6.4 g/dL (ref 6.0–8.3)

## 2024-12-02 LAB — TSH: TSH: 2.22 u[IU]/mL (ref 0.35–5.50)

## 2024-12-02 LAB — VITAMIN D 25 HYDROXY (VIT D DEFICIENCY, FRACTURES): VITD: 24.68 ng/mL — ABNORMAL LOW (ref 30.00–100.00)

## 2024-12-02 LAB — HM DIABETES FOOT EXAM

## 2024-12-02 LAB — LIPID PANEL
Cholesterol: 166 mg/dL (ref 28–200)
HDL: 40 mg/dL
LDL Cholesterol: 99 mg/dL (ref 10–99)
NonHDL: 125.55
Total CHOL/HDL Ratio: 4
Triglycerides: 132 mg/dL (ref 10.0–149.0)
VLDL: 26.4 mg/dL (ref 0.0–40.0)

## 2024-12-02 MED ORDER — PRAVASTATIN SODIUM 10 MG PO TABS: 10.0000 mg | ORAL_TABLET | Freq: Every day | ORAL | 1 refills | Status: AC

## 2024-12-02 NOTE — Assessment & Plan Note (Signed)
 Physical today 12/02/24.  PAP 08/19/19 - negative with negative HPV.  Mammogram 08/12/24 - Birads I.  Colonoscopy 01/2021 - internal hemorrhoids. Otherwise normal.

## 2024-12-02 NOTE — Progress Notes (Signed)
 "  Subjective:    Patient ID: Carrie Wilson, female    DOB: Aug 16, 1956, 69 y.o.   MRN: [id number]  Patient here for  Chief Complaint  Patient presents with   Medical Management of Chronic Issues   Annual Exam    HPI Here for a physical exam. Had lung screening 10/21/24 - recommended f/u in 12 months. Overall doing well. Feels good. Staying active. No chest pain or sob reported. No abdominal pain or bowel change reported. Overall feels good. Discussed pneumonia vaccine. Obtain records - [facility].    Past Medical History:  Diagnosis Date   Carotid aneurysm, right 09/15/2021    CTA Neck-brain 09/17/2021 [facility]): No evidence of dissection/aneurysm of the carotid or vertebral arteries.  No significant arterial stenosis..->  No ICH, acute infarct or mass.  Secular right maxillary sinus surgery.  Major arterial structures appear normal.  No definite stenoses.  2 mm saccular outpouching along C7 segment of right ICA.   Hx of transient ischemic attack (TIA) 08/01/2021   Recurrent episode September 15, 2021: Normal echo.  CTA neck shows 2 mm saccular aneurysm of right ICA.  MRI brain shows multiple subacute infarcts of left MCA territory mainly watershed areas of frontal and parietal lobes concerning for embolic infarcts.   Hyperlipidemia    Stroke [facility])    Vitamin D  deficiency    Past Surgical History:  Procedure Laterality Date   CATARACT EXTRACTION W/PHACO Right 12/05/2022   Procedure: CATARACT EXTRACTION PHACO AND INTRAOCULAR LENS PLACEMENT (IOC) RIGHT MALYUGIN  11.10  01:02.3;  Surgeon: Mittie Gaskin, MD;  Location: [facility];  Service: Ophthalmology;  Laterality: Right;   CATARACT EXTRACTION W/PHACO Left 12/19/2022   Procedure: CATARACT EXTRACTION PHACO AND INTRAOCULAR LENS PLACEMENT (IOC) LEFT MALYUGIN 7.77 00:50.3;  Surgeon: Mittie Gaskin, MD;  Location: [facility];  Service: Ophthalmology;  Laterality: Left;   CHOLECYSTECTOMY  11/12/1992    rectal fissure repair  11/12/2000   Right Frontal Sinus Surgery Right 02/03/2018   OCULOPLASTIC SURGERY: Procedures or frontal orbital mass removal: Stereotactic computer-assisted navigational procedure-cranial/extradural; right eye socket exploration -orbitotomy with bone flap and lesion removal, right frontal sinus/transorbital exploration (frontal sinusotomy) [facility] Main OR: Dr. Hayes (Opthalmology), Dr Redell D. Thorp (ENT)   TRANSTHORACIC ECHOCARDIOGRAM  10/13/2021   Normal LV size and function.  EF 60 to 65%.  Normal diastolic pressure.  Normal RV.  Normal valves   TUBAL LIGATION  11/12/1993   Family History  Problem Relation Age of Onset   Hypertension Mother    Heart disease Mother    Hypercholesterolemia Brother    Colon cancer Neg Hx    Breast cancer Neg Hx    Social History   Socioeconomic History   Marital status: Married    Spouse name: Not on file   Number of children: 1   Years of education: Not on file   Highest education level: Not on file  Occupational History   Not on file  Tobacco Use   Smoking status: Former    Current packs/day: 0.00    Average packs/day: 0.5 packs/day for 40.0 years (20.0 ttl pk-yrs)    Types: Cigarettes    Start date: 02/11/1980    Quit date: 02/11/2020    Years since quitting: 4.8   Smokeless tobacco: Never  Vaping Use   Vaping status: Never Used  Substance and Sexual Activity   Alcohol use: No    Alcohol/week: 0.0 standard drinks of alcohol   Drug use: No  Sexual activity: Not on file  Other Topics Concern   Not on file  Social History Narrative   She is very active, always on the go.  Not necessarily routine exercise, but always doing things.  Not limited by any myalgias or arthralgias or dyspnea.   Social Drivers of Health   Tobacco Use: Medium Risk (12/06/2024)   Patient History    Smoking Tobacco Use: Former    Smokeless Tobacco Use: Never    Passive Exposure: Not on file  Financial Resource Strain: Low Risk (12/30/2023)    Overall Financial Resource Strain (CARDIA)    Difficulty of Paying Living Expenses: Not hard at all  Food Insecurity: No Food Insecurity (12/30/2023)   Hunger Vital Sign    Worried About Running Out of Food in the Last Year: Never true    Ran Out of Food in the Last Year: Never true  Transportation Needs: No Transportation Needs (12/30/2023)   PRAPARE - Administrator, Civil Service (Medical): No    Lack of Transportation (Non-Medical): No  Physical Activity: Sufficiently Active (12/30/2023)   Exercise Vital Sign    Days of Exercise per Week: 5 days    Minutes of Exercise per Session: 60 min  Stress: No Stress Concern Present (12/30/2023)   [employer] of Occupational Health - Occupational Stress Questionnaire    Feeling of Stress : Not at all  Social Connections: Moderately Isolated (12/30/2023)   Social Connection and Isolation Panel    Frequency of Communication with Friends and Family: More than three times a week    Frequency of Social Gatherings with Friends and Family: More than three times a week    Attends Religious Services: Never    Database Administrator or Organizations: No    Attends Banker Meetings: Never    Marital Status: Married  Depression (PHQ2-9): Low Risk (12/30/2023)   Depression (PHQ2-9)    PHQ-2 Score: 0  Alcohol Screen: Low Risk (12/30/2023)   Alcohol Screen    Last Alcohol Screening Score (AUDIT): 0  Housing: Unknown (12/30/2023)   Housing Stability Vital Sign    Unable to Pay for Housing in the Last Year: No    Number of Times Moved in the Last Year: Not on file    Homeless in the Last Year: No  Utilities: Not At Risk (12/30/2023)   AHC Utilities    Threatened with loss of utilities: No  Health Literacy: Adequate Health Literacy (12/30/2023)   B1300 Health Literacy    Frequency of need for help with medical instructions: Never     Review of Systems  Constitutional:  Negative for appetite change and unexpected weight  change.  HENT:  Negative for congestion, sinus pressure and sore throat.   Eyes:  Negative for pain and visual disturbance.  Respiratory:  Negative for cough, chest tightness and shortness of breath.   Cardiovascular:  Negative for chest pain, palpitations and leg swelling.  Gastrointestinal:  Negative for abdominal pain, diarrhea, nausea and vomiting.  Genitourinary:  Negative for difficulty urinating and dysuria.  Musculoskeletal:  Negative for joint swelling and myalgias.  Skin:  Negative for color change and rash.  Neurological:  Negative for dizziness and headaches.  Hematological:  Negative for adenopathy. Does not bruise/bleed easily.  Psychiatric/Behavioral:  Negative for agitation and dysphoric mood.        Objective:     BP 118/70   Pulse 74   Temp 97.7 F (36.5 C) (Oral)   Ht  5' 8 (1.727 m)   Wt 174 lb (78.9 kg)   LMP 12/08/1997   SpO2 97%   BMI 26.46 kg/m  Wt Readings from Last 3 Encounters:  12/02/24 174 lb (78.9 kg)  10/21/24 172 lb (78 kg)  06/01/24 173 lb (78.5 kg)    Physical Exam Vitals reviewed.  Constitutional:      General: She is not in acute distress.    Appearance: Normal appearance. She is well-developed.  HENT:     Head: Normocephalic and atraumatic.     Right Ear: External ear normal.     Left Ear: External ear normal.     Mouth/Throat:     Pharynx: No oropharyngeal exudate or posterior oropharyngeal erythema.  Eyes:     General: No scleral icterus.       Right eye: No discharge.        Left eye: No discharge.     Conjunctiva/sclera: Conjunctivae normal.  Neck:     Thyroid : No thyromegaly.  Cardiovascular:     Rate and Rhythm: Normal rate and regular rhythm.  Pulmonary:     Effort: No tachypnea, accessory muscle usage or respiratory distress.     Breath sounds: Normal breath sounds. No decreased breath sounds or wheezing.  Chest:  Breasts:    Right: No inverted nipple, mass, nipple discharge or tenderness (no axillary adenopathy).      Left: No inverted nipple, mass, nipple discharge or tenderness (no axilarry adenopathy).  Abdominal:     General: Bowel sounds are normal.     Palpations: Abdomen is soft.     Tenderness: There is no abdominal tenderness.  Musculoskeletal:        General: No swelling or tenderness.     Cervical back: Neck supple.  Lymphadenopathy:     Cervical: No cervical adenopathy.  Skin:    Findings: No erythema or rash.  Neurological:     Mental Status: She is alert and oriented to person, place, and time.  Psychiatric:        Mood and Affect: Mood normal.        Behavior: Behavior normal.         Outpatient Encounter Medications as of 12/02/2024  Medication Sig   aspirin 81 MG chewable tablet Chew 81 mg by mouth daily.   cholecalciferol (VITAMIN D ) 1000 units tablet Take 1,000 Units by mouth daily.   clopidogrel  (PLAVIX ) 75 MG tablet Take 1 tablet by mouth once daily   pravastatin  (PRAVACHOL ) 10 MG tablet Take 1 tablet (10 mg total) by mouth daily.   [DISCONTINUED] pravastatin  (PRAVACHOL ) 10 MG tablet Take 1 tablet by mouth once daily   No facility-administered encounter medications on file as of 12/02/2024.     Lab Results  Component Value Date   WBC 6.9 12/02/2024   HGB 14.9 12/02/2024   HCT 45.1 12/02/2024   PLT 201.0 12/02/2024   GLUCOSE 98 12/02/2024   CHOL 166 12/02/2024   TRIG 132.0 12/02/2024   HDL 40.00 12/02/2024   LDLDIRECT 110.0 06/18/2018   LDLCALC 99 12/02/2024   ALT 11 12/02/2024   AST 12 12/02/2024   NA 140 12/02/2024   K 4.4 12/02/2024   CL 104 12/02/2024   CREATININE 0.93 12/02/2024   BUN 6 12/02/2024   CO2 30 12/02/2024   TSH 2.22 12/02/2024   HGBA1C 6.5 12/02/2024   MICROALBUR 0.7 06/01/2024    CT CHEST LUNG CA SCREEN LOW DOSE W/O CM Result Date: 10/26/2024 CLINICAL DATA:  Former 20 pack-year  smoker, quit 2021. EXAM: CT CHEST WITHOUT CONTRAST LOW-DOSE FOR LUNG CANCER SCREENING TECHNIQUE: Multidetector CT imaging of the chest was performed  following the standard protocol without IV contrast. RADIATION DOSE REDUCTION: This exam was performed according to the departmental dose-optimization program which includes automated exposure control, adjustment of the mA and/or kV according to patient size and/or use of iterative reconstruction technique. COMPARISON:  12/21/2020. FINDINGS: Cardiovascular: Atherosclerotic calcification of the aorta and left anterior descending coronary artery. Heart is at the upper limits of normal in size. No pericardial effusion. Mediastinum/Nodes: No pathologically enlarged mediastinal or axillary lymph nodes. Hilar regions are difficult to definitively evaluate without IV contrast. Esophagus is grossly unremarkable. Lungs/Pleura: Mild centrilobular emphysema. Mild cylindrical bronchiectasis. No suspicious pulmonary nodules. No pleural fluid. Airway is unremarkable. Upper Abdomen: Cholecystectomy. Partially imaged duodenal diverticulum. Visualized portions of the liver, adrenal glands, kidneys, spleen, pancreas, stomach and bowel are otherwise grossly unremarkable. No upper abdominal adenopathy. Musculoskeletal: Degenerative changes in the spine. IMPRESSION: 1. Lung-RADS 1, negative. Continue annual screening with low-dose chest CT without contrast in 12 months. 2. Mild cylindrical bronchiectasis. 3. Aortic atherosclerosis (ICD10-I70.0). Coronary artery calcification. 4.  Emphysema (ICD10-J43.9). Electronically Signed   By: Newell Eke M.D.   On: 10/26/2024 10:57       Assessment & Plan:  Controlled type 2 diabetes mellitus with circulatory disorder (HCC) Assessment & Plan: Low carb diet and exercise. Follow met b and A1c.   Orders: -     Hemoglobin A1c  Vitamin D  deficiency Assessment & Plan: Check vitamin D  level.   Orders: -     VITAMIN D  25 Hydroxy (Vit-D Deficiency, Fractures)  Hyperlipidemia associated with type 2 diabetes mellitus (HCC) Assessment & Plan: On pravastatin .  Low cholesterol diet and  exercise.  Follow lipid panel.   Orders: -     Lipid panel -     Hepatic function panel -     CBC with Differential/Platelet -     TSH  Controlled type 2 diabetes mellitus with complication (HCC) -     Basic metabolic panel with GFR  Essential hypertension Assessment & Plan: Blood pressure has been doing well on no medication.  Follow pressures. Follow metabolic panel.   Orders: -     Basic metabolic panel with GFR  Health care maintenance Assessment & Plan: Physical today 12/02/24.  PAP 08/19/19 - negative with negative HPV.  Mammogram 08/12/24 - Birads I.  Colonoscopy 01/2021 - internal hemorrhoids. Otherwise normal.     Emphysema lung (HCC) Assessment & Plan: Breathing stable. Has quit smoking. Follow.    History of TIA (transient ischemic attack) Assessment & Plan: History of TIA (less than two months apart).  MRI as outlined previously  (likely embolic infarcts).  Concern for afib as an etiology.  Saw cardiology.  Previously discussed loop recorder.  She declined.  Continue aspirin and plavix .   Stable.    Other orders -     Pravastatin  Sodium; Take 1 tablet (10 mg total) by mouth daily.  Dispense: 90 tablet; Refill: 1     Allena Hamilton, MD "

## 2024-12-03 ENCOUNTER — Ambulatory Visit: Payer: Self-pay | Admitting: Internal Medicine

## 2024-12-04 NOTE — Telephone Encounter (Unsigned)
 Copied from CRM #[id number]. Topic: Clinical - Lab/Test Results >> Dec 03, 2024  4:18 PM Carrie Wilson wrote: Reason for CRM: Patient is returning call regarding labs, advised patient per chart note she can continue Pravastatin  and she needs to increase the Vit D3 to 2000 units daily instead of 1000 units daily. Patient verbalized understanding.

## 2024-12-06 ENCOUNTER — Encounter: Payer: Self-pay | Admitting: Internal Medicine

## 2024-12-06 NOTE — Assessment & Plan Note (Signed)
 Check vitamin D level

## 2024-12-06 NOTE — Assessment & Plan Note (Signed)
 Blood pressure has been doing well on no medication.  Follow pressures. Follow metabolic panel.

## 2024-12-06 NOTE — Assessment & Plan Note (Signed)
On pravastatin.  Low cholesterol diet and exercise.  Follow lipid panel.   

## 2024-12-06 NOTE — Assessment & Plan Note (Signed)
 History of TIA (less than two months apart).  MRI as outlined previously  (likely embolic infarcts).  Concern for afib as an etiology.  Saw cardiology.  Previously discussed loop recorder.  She declined.  Continue aspirin and plavix .   Stable.

## 2024-12-06 NOTE — Assessment & Plan Note (Signed)
 Low-carb diet and exercise.  Follow met b and A1c.

## 2024-12-06 NOTE — Assessment & Plan Note (Signed)
Breathing stable.  Has quit smoking.  Follow.

## 2025-01-04 ENCOUNTER — Ambulatory Visit: Payer: Medicare HMO

## 2025-06-01 ENCOUNTER — Ambulatory Visit: Admitting: Internal Medicine
